# Patient Record
Sex: Male | Born: 1970
Health system: Southern US, Community
[De-identification: ages and names within clinical notes are randomized; demographics above are authoritative.]

## PROBLEM LIST (undated history)

## (undated) DIAGNOSIS — M1990 Unspecified osteoarthritis, unspecified site: Secondary | ICD-10-CM

## (undated) DIAGNOSIS — R52 Pain, unspecified: Secondary | ICD-10-CM

## (undated) DIAGNOSIS — F419 Anxiety disorder, unspecified: Secondary | ICD-10-CM

## (undated) DIAGNOSIS — Z9189 Other specified personal risk factors, not elsewhere classified: Secondary | ICD-10-CM

## (undated) DIAGNOSIS — E119 Type 2 diabetes mellitus without complications: Secondary | ICD-10-CM

## (undated) DIAGNOSIS — Z87442 Personal history of urinary calculi: Secondary | ICD-10-CM

## (undated) DIAGNOSIS — I10 Essential (primary) hypertension: Secondary | ICD-10-CM

## (undated) DIAGNOSIS — E785 Hyperlipidemia, unspecified: Secondary | ICD-10-CM

## (undated) DIAGNOSIS — N201 Calculus of ureter: Secondary | ICD-10-CM

## (undated) DIAGNOSIS — G894 Chronic pain syndrome: Secondary | ICD-10-CM

## (undated) HISTORY — DX: Hyperlipidemia, unspecified: E78.5

## (undated) HISTORY — DX: Essential (primary) hypertension: I10

## (undated) HISTORY — PX: TOE SURGERY: SHX1073

## (undated) HISTORY — DX: Anxiety disorder, unspecified: F41.9

---

## 1998-11-24 ENCOUNTER — Ambulatory Visit (HOSPITAL_COMMUNITY): Admission: RE | Admit: 1998-11-24 | Discharge: 1998-11-24 | Payer: Self-pay | Admitting: Cardiology

## 1999-07-20 ENCOUNTER — Encounter: Payer: Self-pay | Admitting: Family Medicine

## 1999-07-20 ENCOUNTER — Ambulatory Visit (HOSPITAL_COMMUNITY): Admission: RE | Admit: 1999-07-20 | Discharge: 1999-07-20 | Payer: Self-pay | Admitting: Family Medicine

## 2000-05-17 ENCOUNTER — Ambulatory Visit (HOSPITAL_COMMUNITY): Admission: RE | Admit: 2000-05-17 | Discharge: 2000-05-17 | Payer: Self-pay | Admitting: Family Medicine

## 2001-03-30 ENCOUNTER — Encounter: Payer: Self-pay | Admitting: Family Medicine

## 2001-03-30 ENCOUNTER — Ambulatory Visit (HOSPITAL_COMMUNITY): Admission: RE | Admit: 2001-03-30 | Discharge: 2001-03-30 | Payer: Self-pay | Admitting: Family Medicine

## 2002-10-14 ENCOUNTER — Ambulatory Visit (HOSPITAL_COMMUNITY): Admission: RE | Admit: 2002-10-14 | Discharge: 2002-10-14 | Payer: Self-pay | Admitting: *Deleted

## 2002-10-14 ENCOUNTER — Encounter: Payer: Self-pay | Admitting: *Deleted

## 2002-12-25 ENCOUNTER — Encounter: Admission: RE | Admit: 2002-12-25 | Discharge: 2002-12-25 | Payer: Self-pay | Admitting: Family Medicine

## 2002-12-25 ENCOUNTER — Encounter: Payer: Self-pay | Admitting: Family Medicine

## 2003-02-14 ENCOUNTER — Encounter: Admission: RE | Admit: 2003-02-14 | Discharge: 2003-02-14 | Payer: Self-pay | Admitting: Family Medicine

## 2003-02-14 ENCOUNTER — Encounter: Payer: Self-pay | Admitting: Family Medicine

## 2004-06-18 ENCOUNTER — Encounter: Admission: RE | Admit: 2004-06-18 | Discharge: 2004-06-18 | Payer: Self-pay | Admitting: Family Medicine

## 2004-06-19 ENCOUNTER — Emergency Department (HOSPITAL_COMMUNITY): Admission: EM | Admit: 2004-06-19 | Discharge: 2004-06-19 | Payer: Self-pay | Admitting: Emergency Medicine

## 2005-02-28 ENCOUNTER — Encounter: Admission: RE | Admit: 2005-02-28 | Discharge: 2005-05-29 | Payer: Self-pay | Admitting: Family Medicine

## 2005-04-17 ENCOUNTER — Emergency Department (HOSPITAL_COMMUNITY): Admission: EM | Admit: 2005-04-17 | Discharge: 2005-04-18 | Payer: Self-pay | Admitting: Emergency Medicine

## 2007-01-30 ENCOUNTER — Ambulatory Visit (HOSPITAL_COMMUNITY): Admission: RE | Admit: 2007-01-30 | Discharge: 2007-01-30 | Payer: Self-pay | Admitting: Family Medicine

## 2010-05-26 ENCOUNTER — Encounter: Admission: RE | Admit: 2010-05-26 | Discharge: 2010-05-26 | Payer: Self-pay | Admitting: Family Medicine

## 2011-04-14 ENCOUNTER — Other Ambulatory Visit (HOSPITAL_COMMUNITY): Payer: Managed Care, Other (non HMO)

## 2011-04-18 ENCOUNTER — Other Ambulatory Visit: Payer: Self-pay | Admitting: Orthopaedic Surgery

## 2011-04-18 ENCOUNTER — Encounter (HOSPITAL_COMMUNITY): Payer: Managed Care, Other (non HMO) | Attending: Orthopaedic Surgery

## 2011-04-18 DIAGNOSIS — M169 Osteoarthritis of hip, unspecified: Secondary | ICD-10-CM | POA: Insufficient documentation

## 2011-04-18 DIAGNOSIS — Z79899 Other long term (current) drug therapy: Secondary | ICD-10-CM | POA: Insufficient documentation

## 2011-04-18 DIAGNOSIS — Z01812 Encounter for preprocedural laboratory examination: Secondary | ICD-10-CM | POA: Insufficient documentation

## 2011-04-18 DIAGNOSIS — M25559 Pain in unspecified hip: Secondary | ICD-10-CM | POA: Insufficient documentation

## 2011-04-18 DIAGNOSIS — I10 Essential (primary) hypertension: Secondary | ICD-10-CM | POA: Insufficient documentation

## 2011-04-18 DIAGNOSIS — M1610 Unilateral primary osteoarthritis, unspecified hip: Secondary | ICD-10-CM | POA: Insufficient documentation

## 2011-04-18 DIAGNOSIS — Z01810 Encounter for preprocedural cardiovascular examination: Secondary | ICD-10-CM | POA: Insufficient documentation

## 2011-04-18 DIAGNOSIS — E119 Type 2 diabetes mellitus without complications: Secondary | ICD-10-CM | POA: Insufficient documentation

## 2011-04-18 LAB — URINALYSIS, ROUTINE W REFLEX MICROSCOPIC
Bilirubin Urine: NEGATIVE
Glucose, UA: 250 mg/dL — AB
Hgb urine dipstick: NEGATIVE
Ketones, ur: NEGATIVE mg/dL
Nitrite: NEGATIVE
Protein, ur: NEGATIVE mg/dL
Specific Gravity, Urine: 1.026 (ref 1.005–1.030)
Urobilinogen, UA: 0.2 mg/dL (ref 0.0–1.0)
pH: 5.5 (ref 5.0–8.0)

## 2011-04-18 LAB — BASIC METABOLIC PANEL WITH GFR
BUN: 11 mg/dL (ref 6–23)
CO2: 30 meq/L (ref 19–32)
Calcium: 9.6 mg/dL (ref 8.4–10.5)
Chloride: 100 meq/L (ref 96–112)
Creatinine, Ser: 1.1 mg/dL (ref 0.4–1.5)
GFR calc non Af Amer: >60 mL/min
Glucose, Bld: 265 mg/dL — ABNORMAL HIGH (ref 70–99)
Potassium: 4.1 meq/L (ref 3.5–5.1)
Sodium: 141 meq/L (ref 135–145)

## 2011-04-18 LAB — CBC
HCT: 42.3 % (ref 39.0–52.0)
Hemoglobin: 14.3 g/dL (ref 13.0–17.0)
MCH: 28.1 pg (ref 26.0–34.0)
MCHC: 33.8 g/dL (ref 30.0–36.0)
MCV: 83.3 fL (ref 78.0–100.0)
Platelets: 279 K/uL (ref 150–400)
RBC: 5.08 MIL/uL (ref 4.22–5.81)
RDW: 12.2 % (ref 11.5–15.5)
WBC: 8.8 K/uL (ref 4.0–10.5)

## 2011-04-18 LAB — SURGICAL PCR SCREEN
MRSA, PCR: NEGATIVE
Staphylococcus aureus: NEGATIVE

## 2011-04-22 ENCOUNTER — Inpatient Hospital Stay (HOSPITAL_COMMUNITY): Payer: Managed Care, Other (non HMO)

## 2011-04-22 ENCOUNTER — Inpatient Hospital Stay (HOSPITAL_COMMUNITY)
Admission: RE | Admit: 2011-04-22 | Discharge: 2011-04-26 | DRG: 470 | Disposition: A | Discharge status: Home Health | Payer: Managed Care, Other (non HMO) | Source: Ambulatory Visit | Attending: Orthopaedic Surgery | Admitting: Orthopaedic Surgery

## 2011-04-22 DIAGNOSIS — I10 Essential (primary) hypertension: Secondary | ICD-10-CM | POA: Diagnosis present

## 2011-04-22 DIAGNOSIS — R42 Dizziness and giddiness: Secondary | ICD-10-CM | POA: Diagnosis not present

## 2011-04-22 DIAGNOSIS — G8929 Other chronic pain: Secondary | ICD-10-CM | POA: Diagnosis present

## 2011-04-22 DIAGNOSIS — M1611 Unilateral primary osteoarthritis, right hip: Secondary | ICD-10-CM

## 2011-04-22 DIAGNOSIS — Z79899 Other long term (current) drug therapy: Secondary | ICD-10-CM

## 2011-04-22 DIAGNOSIS — M1610 Unilateral primary osteoarthritis, unspecified hip: Principal | ICD-10-CM | POA: Diagnosis present

## 2011-04-22 DIAGNOSIS — E119 Type 2 diabetes mellitus without complications: Secondary | ICD-10-CM | POA: Diagnosis present

## 2011-04-22 DIAGNOSIS — M169 Osteoarthritis of hip, unspecified: Principal | ICD-10-CM | POA: Diagnosis present

## 2011-04-22 HISTORY — PX: TOTAL HIP ARTHROPLASTY: SHX124

## 2011-04-22 LAB — GLUCOSE, CAPILLARY
Glucose-Capillary: 228 mg/dL — ABNORMAL HIGH (ref 70–99)
Glucose-Capillary: 200 mg/dL — ABNORMAL HIGH (ref 70–99)

## 2011-04-22 LAB — TYPE AND SCREEN

## 2011-04-23 LAB — CBC
HCT: 31.5 % — ABNORMAL LOW (ref 39.0–52.0)
MCV: 84 fL (ref 78.0–100.0)
WBC: 6.8 10*3/uL (ref 4.0–10.5)

## 2011-04-23 LAB — GLUCOSE, CAPILLARY
Glucose-Capillary: 208 mg/dL — ABNORMAL HIGH (ref 70–99)
Glucose-Capillary: 201 mg/dL — ABNORMAL HIGH (ref 70–99)
Glucose-Capillary: 223 mg/dL — ABNORMAL HIGH (ref 70–99)

## 2011-04-23 LAB — BASIC METABOLIC PANEL
CO2: 29 mEq/L (ref 19–32)
Calcium: 8.5 mg/dL (ref 8.4–10.5)
GFR calc Af Amer: >60 mL/min (ref >60)
Glucose, Bld: 206 mg/dL — ABNORMAL HIGH (ref 70–99)
Sodium: 137 mEq/L (ref 135–145)

## 2011-04-23 NOTE — H&P (Signed)
  NAMEJAQUIN, COY NO.:  [id number]  MEDICAL RECORD NO.:  [id number]           PATIENT TYPE:  I  LOCATION:  1608                         FACILITY:  [facility] [place]  PHYSICIAN:  Vanita Panda. Magnus Ivan, M.D.DATE OF BIRTH:  [birth year]  DATE OF ADMISSION:  04/22/2011 DATE OF DISCHARGE:                             HISTORY & PHYSICAL   CHIEF COMPLAINT:  Severe right hip pain.  HISTORY OF PRESENT ILLNESS:  Mr. Millirons is a 40 year old gentleman with severe osteoarthritis and degenerative joint disease involving both his hips, the right is worse than left.  He is on chronic Vicodin due to this, and it is greatly affected his life and daily living.  At this point, due to severe pain, he wished to proceed with a total hip arthroplasty.  The risks and benefits have been explained to him in detail and he understands these and does wish to proceed with hip replacement due to the effects on his life.  PAST MEDICAL HISTORY: 1. Arthritis. 2. Diabetes. 3. High blood pressure.  MEDICATIONS:  Metformin, lisinopril, hydrocodone, simvastatin, Lexapro, metoprolol.  FAMILY MEDICAL HISTORY:  Diabetes, leukemia, __________ cancer, high blood pressure.  SOCIAL HISTORY:  No tobacco and no alcohol use.  He does work.  He is divorced.  REVIEW OF SYSTEMS:  Negative for chest pain, shortness of breath, fever, chills, nausea, or vomiting.  PHYSICAL EXAMINATION:  VITAL SIGNS:  Temperature 98.4, heart rate 74, respiratory rate 16, blood pressure 140/90. GENERAL:  He is alert and oriented x3, in no acute distress. HEENT:  Normocephalic, atraumatic.  Pupils equal, round, and reactive to light. NECK:  Supple. LUNGS:  Clear to auscultation bilaterally. HEART:  Regular rate and rhythm. ABDOMEN:  Benign. EXTREMITIES:  Right hip and left hip, both show severe pain with internal and external rotation with a right being worse than the left. His leg lengths were nearly equal.  X-rays  confirmed severe arthritis of both his hips with the right being worse than the left.  IMPRESSION:  This is a 40 year old gentleman with debilitating arthritis of both his hips, right worse than left.  PLAN:  We will proceed today with a right total hip arthroplasty.  He understands the risks and benefits of this, and he is anxious about proceeding with surgery due to his need for chronic pain medications and his __________.     Vanita Panda. Magnus Ivan, M.D.     CYB/MEDQ  D:  04/22/2011  T:  04/23/2011  Job:  [id number]  Electronically Signed by Doneen Poisson M.D. on 04/23/2011 11:15:47 AM

## 2011-04-23 NOTE — Op Note (Signed)
NAMERAIDER, VALBUENA NO.:  [id number]  MEDICAL RECORD NO.:  [id number]           PATIENT TYPE:  I  LOCATION:  1608                         FACILITY:  [facility]  PHYSICIAN:  Vanita Panda. Magnus Ivan, M.D.DATE OF BIRTH:  [birth year]  DATE OF PROCEDURE:  04/22/2011 DATE OF DISCHARGE:                              OPERATIVE REPORT   PREOPERATIVE DIAGNOSES:  Severe end-stage arthritis and degenerative joint disease with chronic pain, right hip.  POSTOPERATIVE DIAGNOSIS:  Severe end-stage arthritis and degenerative joint disease with chronic pain, right hip.  PROCEDURE:  Right total hip arthroplasty through direct anterior approach.  IMPLANTS:  DePuy size 58 Pinnacle Gription cup with screw holes, neutral +4 polyethylene liner, size 15 KLA Corail femoral component with collar and HA coating, size 36 +1.5 ceramic femoral head.  SURGEON:  Vanita Panda. Magnus Ivan, M.D.  ASSISTANT:  Maud Deed, PA-C.  ANTIBIOTICS:  IV Ancef 2 g.  BLOOD LOSS:  1300 cc.  COMPLICATIONS:  None.  INDICATIONS:  Mr. Brandon Gordon is a 40 year old gentleman who is large individual.  He has developed bilateral hip severe degenerative joint disease.  It was recognized either on the MRI in 2007.  He was on chronic Vicodin due to this and now has get to the point where it started to affect his activities of daily living, he wishes to proceed with a total hip arthroplasty.  The risks and benefits of this had been explained to him in detail including the risk of acute blood loss anemia, DVT, and PE; and he just wishes to proceed with surgery.  DESCRIPTION OF PROCEDURE:  After informed consent was obtained. Appropriate right hip was marked.  He was brought to the operating room and general anesthesia was obtained in the stretcher.  Traction boots were placed on his feet prior to placement on the Hana table and a Foley catheter was placed as well.  He was then placed supine on the Hana table and  the perineal post in place with both legs in large-scale retraction with detraction on leg.  His right hip was then prepped and draped with DuraPrep and sterile drapes.  A time-out was called and this identified the correct patient and correct right hip.  I then made an incision 1 cm distal and 3 cm posterior to the anterior superior iliac spine and carried this down obliquely down to the tensor fasciae latae. Tensor fasciae latae was then divided longitudinally and I proceeded with a direct anterior approach to the hip.  A Cobra retractor was placed around the neck laterally and medially.  We tied the capsule after coagulating the lateral femoral circumflex vessels.  I then was able to make my femoral neck cut just proximal to the lesser trochanter. I assessed the neck cut under direct fluoroscopic guidance first and then made a cut under direct visualization.  We then removed the head in its entirety and his head was incredibly large head with significant wear.  There was osteophytes surrounding the acetabulum where we cleanedthese debris as well.  I then began reaming.  We then placed a Hohmann anteromedially and posteriorly, so I began  reaming from the size 45 all the way up to 57.  We then placed the real 58 acetabular component under direct fluoroscopic guidance and had a good seat to this.  Next, attention was turned to the femur.  A hook was placed into the femur for attachment to the Hana table.  His leg was externally rotated to 90 degrees, extended, and adducted.  This allowed access to the femoral canal.  I released the lateral capsule and the piriformis from behind the greater trochanter.  We then used a cookie cutter to open up the femoral canal, then I began broaching with a size 8 broach all the way up to the size 15 broach.  We then placed a trial head and reduced this in a KLA neck and reduced this into the acetabulum.  This was felt to be stable on leg lengths and near  equal under direct fluoroscopic guidance. I then placed the real size 15 stem with a collar in appropriate version in the real ceramic 36 +1.5 head and reduce this back into the acetabulum as well.  I externally rotated the hip to 90 degrees, it was tight, but I could not dislocate him and then internally rotated as well as leg lengths were looked to be equal under direct fluoroscopic guidance, may be just a little bit long, but he has gotten severe disease on his other hip.  We then copiously irrigated the tissues and closed the joint capsule with #1 Ethibond suture followed by interrupted #1 Vicryl into the tensor fasciae latae, 2-0 Vicryl in the subcutaneous tissues, and subcuticular 4-0 Vicryl with Dermabond on the skin and a Mepilex dressing.  He was awakened, extubated, and taken to the recovery room in stable condition.  All final counts were correct and there were no complications noted.  Of note, Maud Deed, PA-C was present for the entirety of the case and her assistance was in interval and exposure and placement of the implants.     Vanita Panda. Magnus Ivan, M.D.     CYB/MEDQ  D:  04/22/2011  T:  04/23/2011  Job:  [id number]  Electronically Signed by Doneen Poisson M.D. on 04/23/2011 11:15:45 AM

## 2011-04-24 LAB — CBC
MCH: 28.1 pg (ref 26.0–34.0)
MCHC: 33.1 g/dL (ref 30.0–36.0)
MCV: 84.7 fL (ref 78.0–100.0)
Platelets: 152 10*3/uL (ref 150–400)
RBC: 3.6 MIL/uL — ABNORMAL LOW (ref 4.22–5.81)

## 2011-04-24 LAB — BASIC METABOLIC PANEL
BUN: 6 mg/dL (ref 6–23)
Chloride: 100 mEq/L (ref 96–112)
Creatinine, Ser: 0.8 mg/dL (ref 0.4–1.5)

## 2011-04-24 LAB — GLUCOSE, CAPILLARY
Glucose-Capillary: 214 mg/dL — ABNORMAL HIGH (ref 70–99)
Glucose-Capillary: 178 mg/dL — ABNORMAL HIGH (ref 70–99)

## 2011-04-25 LAB — BASIC METABOLIC PANEL
CO2: 28 mEq/L (ref 19–32)
Chloride: 97 mEq/L (ref 96–112)
GFR calc Af Amer: >60 mL/min (ref >60)
Sodium: 133 mEq/L — ABNORMAL LOW (ref 135–145)

## 2011-04-25 LAB — GLUCOSE, CAPILLARY
Glucose-Capillary: 189 mg/dL — ABNORMAL HIGH (ref 70–99)
Glucose-Capillary: 166 mg/dL — ABNORMAL HIGH (ref 70–99)

## 2011-04-25 LAB — CBC
Hemoglobin: 9.5 g/dL — ABNORMAL LOW (ref 13.0–17.0)
MCH: 28.1 pg (ref 26.0–34.0)
RBC: 3.38 MIL/uL — ABNORMAL LOW (ref 4.22–5.81)

## 2011-04-26 LAB — GLUCOSE, CAPILLARY: Glucose-Capillary: 177 mg/dL — ABNORMAL HIGH (ref 70–99)

## 2011-04-28 NOTE — Discharge Summary (Signed)
  NAMEJOVANTE, HAMMITT NO.:  [id number]  MEDICAL RECORD NO.:  [id number]           PATIENT TYPE:  I  LOCATION:  1608                         [facility]ACILITY:  [facility]  PHYSICIAN:  Vanita Panda. Magnus Ivan, M.D.DATE OF BIRTH:  06-19-1971  DATE OF ADMISSION:  04/22/2011 DATE OF DISCHARGE:  04/26/2011                              DISCHARGE SUMMARY   ADMITTING DIAGNOSES:  Severe osteoarthritis and degenerative joint disease, right hip.  DISCHARGE DIAGNOSES:  Severe osteoarthritis and degenerative joint disease, right hip.  PROCEDURE:  Right total hip arthroplasty on Apr 22, 2011.  HOSPITAL COURSE:  Mr. Mcmannis was admitted as an inpatient on Apr 22, 2011, after having an elective right total hip arthroplasty through direct anterior approach.  His hospital course was uneventful.  He remained afebrile with stable vital signs throughout hospital course and worked with physical therapy and occupational therapy successfully.  It was felt he can be discharged safely to home.  He was tolerating oral diet.  His incision was clean, dry, and intact.  He was afebrile with stable vital signs by the time of discharge.  DISPOSITION:  To home.  DISCHARGE INSTRUCTIONS:  While he is at home, will continue to work with therapy with weight bearing as tolerated and no hip precautions.  He can get his incisions wet in shower and follow up in my office in 2 weeks.  DISCHARGE MEDICATIONS: 1. Percocet. 2. Robaxin. 3. Xarelto. 4. Metformin. 5. Lisinopril. 6. Simvastatin. 7. Lexapro. 8. Metoprolol.     Vanita Panda. Magnus Ivan, M.D.     CYB/MEDQ  D:  04/26/2011  T:  04/27/2011  Job:  [id number]  Electronically Signed by Doneen Poisson M.D. on 04/28/2011 07:43:27 PM

## 2012-09-24 ENCOUNTER — Ambulatory Visit: Payer: Managed Care, Other (non HMO) | Attending: Family Medicine | Admitting: Physical Therapy

## 2012-09-24 DIAGNOSIS — M25559 Pain in unspecified hip: Secondary | ICD-10-CM | POA: Insufficient documentation

## 2012-09-24 DIAGNOSIS — M545 Low back pain, unspecified: Secondary | ICD-10-CM | POA: Insufficient documentation

## 2012-09-24 DIAGNOSIS — IMO0001 Reserved for inherently not codable concepts without codable children: Secondary | ICD-10-CM | POA: Insufficient documentation

## 2012-09-24 DIAGNOSIS — M2560 Stiffness of unspecified joint, not elsewhere classified: Secondary | ICD-10-CM | POA: Insufficient documentation

## 2012-09-26 ENCOUNTER — Ambulatory Visit: Payer: Managed Care, Other (non HMO) | Admitting: Physical Therapy

## 2012-10-02 ENCOUNTER — Ambulatory Visit: Payer: Managed Care, Other (non HMO) | Admitting: Physical Therapy

## 2012-10-04 ENCOUNTER — Ambulatory Visit: Payer: Managed Care, Other (non HMO) | Admitting: Physical Therapy

## 2012-10-09 ENCOUNTER — Ambulatory Visit: Payer: Managed Care, Other (non HMO) | Admitting: Physical Therapy

## 2012-10-12 ENCOUNTER — Ambulatory Visit: Payer: Managed Care, Other (non HMO) | Admitting: Physical Therapy

## 2012-10-16 ENCOUNTER — Ambulatory Visit: Payer: Managed Care, Other (non HMO) | Admitting: Physical Therapy

## 2012-10-19 ENCOUNTER — Ambulatory Visit: Payer: Managed Care, Other (non HMO) | Attending: Family Medicine | Admitting: Physical Therapy

## 2012-10-19 DIAGNOSIS — M25559 Pain in unspecified hip: Secondary | ICD-10-CM | POA: Insufficient documentation

## 2012-10-19 DIAGNOSIS — M545 Low back pain, unspecified: Secondary | ICD-10-CM | POA: Insufficient documentation

## 2012-10-19 DIAGNOSIS — M2560 Stiffness of unspecified joint, not elsewhere classified: Secondary | ICD-10-CM | POA: Insufficient documentation

## 2012-10-19 DIAGNOSIS — IMO0001 Reserved for inherently not codable concepts without codable children: Secondary | ICD-10-CM | POA: Insufficient documentation

## 2012-10-23 ENCOUNTER — Ambulatory Visit: Payer: Managed Care, Other (non HMO) | Admitting: Physical Therapy

## 2012-10-25 ENCOUNTER — Ambulatory Visit: Payer: Managed Care, Other (non HMO) | Admitting: Physical Therapy

## 2012-10-26 ENCOUNTER — Ambulatory Visit: Payer: Managed Care, Other (non HMO) | Admitting: Physical Therapy

## 2012-10-31 ENCOUNTER — Ambulatory Visit: Payer: Managed Care, Other (non HMO) | Admitting: Physical Therapy

## 2012-11-06 ENCOUNTER — Ambulatory Visit: Payer: Managed Care, Other (non HMO) | Admitting: Physical Therapy

## 2015-03-09 ENCOUNTER — Encounter: Payer: Self-pay | Admitting: Neurology

## 2015-03-10 ENCOUNTER — Encounter: Payer: Self-pay | Admitting: Neurology

## 2015-03-12 ENCOUNTER — Ambulatory Visit: Payer: Managed Care, Other (non HMO) | Admitting: Neurology

## 2015-03-23 ENCOUNTER — Telehealth: Payer: Self-pay | Admitting: Neurology

## 2015-03-23 ENCOUNTER — Encounter: Payer: Self-pay | Admitting: Neurology

## 2015-03-23 NOTE — Telephone Encounter (Signed)
error 

## 2015-03-23 NOTE — Telephone Encounter (Signed)
Pt no showed 03/12/15 appt w/ Dr. Arbutus Leasat. Per Dr. Arbutus Leasat, we will be unable to r/s this appt. No show letter mailed to patient and CC'ed to Dr. Tiburcio PeaHarris / Roanna RaiderSherri S>

## 2015-05-13 ENCOUNTER — Ambulatory Visit: Payer: Managed Care, Other (non HMO) | Admitting: Neurology

## 2015-12-25 MED FILL — METFORMIN HCL ER 500 MG TAB: 500 | 30 days supply | Qty: 120 | Fill #2

## 2015-12-25 MED FILL — BUPROPION HCL XL 300 MG TAB: 300 | 30 days supply | Qty: 30 | Fill #2

## 2016-01-01 MED FILL — ALPRAZolam 1 MG TABS: 1 | 30 days supply | Qty: 60 | Fill #2

## 2016-01-07 DIAGNOSIS — G894 Chronic pain syndrome: Secondary | ICD-10-CM | POA: Diagnosis not present

## 2016-01-07 DIAGNOSIS — M25552 Pain in left hip: Secondary | ICD-10-CM | POA: Diagnosis not present

## 2016-01-07 DIAGNOSIS — Z79891 Long term (current) use of opiate analgesic: Secondary | ICD-10-CM | POA: Diagnosis not present

## 2016-01-07 DIAGNOSIS — G890 Central pain syndrome: Secondary | ICD-10-CM | POA: Diagnosis not present

## 2016-01-07 DIAGNOSIS — M79651 Pain in right thigh: Secondary | ICD-10-CM | POA: Diagnosis not present

## 2016-01-07 DIAGNOSIS — M545 Low back pain: Secondary | ICD-10-CM | POA: Diagnosis not present

## 2016-01-07 DIAGNOSIS — F1120 Opioid dependence, uncomplicated: Secondary | ICD-10-CM | POA: Diagnosis not present

## 2016-01-07 MED FILL — METOPROLOL SUCC ER 100 MG T: 100 | 30 days supply | Qty: 30 | Fill #2

## 2016-01-07 MED FILL — SUBOXONE 8 MG-2 MG SL FILM: 8-2 | 30 days supply | Qty: 90 | Fill #0

## 2016-01-18 MED FILL — VIIBRYD 40 MG TABLET: 40 | 30 days supply | Qty: 30 | Fill #3

## 2016-01-27 DIAGNOSIS — I10 Essential (primary) hypertension: Secondary | ICD-10-CM | POA: Diagnosis not present

## 2016-01-27 DIAGNOSIS — Z7984 Long term (current) use of oral hypoglycemic drugs: Secondary | ICD-10-CM | POA: Diagnosis not present

## 2016-01-27 DIAGNOSIS — F419 Anxiety disorder, unspecified: Secondary | ICD-10-CM | POA: Diagnosis not present

## 2016-01-27 DIAGNOSIS — E1165 Type 2 diabetes mellitus with hyperglycemia: Secondary | ICD-10-CM | POA: Diagnosis not present

## 2016-01-27 DIAGNOSIS — E291 Testicular hypofunction: Secondary | ICD-10-CM | POA: Diagnosis not present

## 2016-01-27 DIAGNOSIS — E785 Hyperlipidemia, unspecified: Secondary | ICD-10-CM | POA: Diagnosis not present

## 2016-01-28 MED FILL — BUPROPION HCL XL 300 MG TAB: 300 | 30 days supply | Qty: 30 | Fill #3

## 2016-02-01 MED FILL — ALPRAZolam 1 MG TABS: 1 | 30 days supply | Qty: 60 | Fill #3

## 2016-02-04 DIAGNOSIS — Z79891 Long term (current) use of opiate analgesic: Secondary | ICD-10-CM | POA: Diagnosis not present

## 2016-02-04 DIAGNOSIS — M25552 Pain in left hip: Secondary | ICD-10-CM | POA: Diagnosis not present

## 2016-02-04 DIAGNOSIS — F1120 Opioid dependence, uncomplicated: Secondary | ICD-10-CM | POA: Diagnosis not present

## 2016-02-04 DIAGNOSIS — M79651 Pain in right thigh: Secondary | ICD-10-CM | POA: Diagnosis not present

## 2016-02-04 DIAGNOSIS — M545 Low back pain: Secondary | ICD-10-CM | POA: Diagnosis not present

## 2016-02-04 DIAGNOSIS — G894 Chronic pain syndrome: Secondary | ICD-10-CM | POA: Diagnosis not present

## 2016-02-04 DIAGNOSIS — G8929 Other chronic pain: Secondary | ICD-10-CM | POA: Diagnosis not present

## 2016-02-04 MED FILL — SUBOXONE 8 MG-2 MG SL FILM: 8-2 | 30 days supply | Qty: 90 | Fill #0

## 2016-02-15 MED FILL — METFORMIN HCL ER 500 MG TAB: 500 | 30 days supply | Qty: 120 | Fill #3

## 2016-02-15 MED FILL — METOPROLOL SUCC ER 100 MG T: 100 | 30 days supply | Qty: 30 | Fill #3

## 2016-02-18 MED FILL — ATORVASTATIN 40 MG TABLET: 40 | 30 days supply | Qty: 30 | Fill #1

## 2016-02-18 MED FILL — TANZEUM 30 MG PEN INJECT: 30 | 28 days supply | Qty: 4 | Fill #2

## 2016-02-18 MED FILL — VIIBRYD 40 MG TABLET: 40 | 30 days supply | Qty: 30 | Fill #4

## 2016-02-26 MED FILL — BUPROPION HCL XL 300 MG TAB: 300 | 30 days supply | Qty: 30 | Fill #4

## 2016-03-02 MED FILL — ALPRAZolam 1 MG TABS: 1 | 30 days supply | Qty: 60 | Fill #0

## 2016-03-03 DIAGNOSIS — E291 Testicular hypofunction: Secondary | ICD-10-CM | POA: Diagnosis not present

## 2016-03-08 DIAGNOSIS — Z79891 Long term (current) use of opiate analgesic: Secondary | ICD-10-CM | POA: Diagnosis not present

## 2016-03-08 DIAGNOSIS — G8929 Other chronic pain: Secondary | ICD-10-CM | POA: Diagnosis not present

## 2016-03-08 DIAGNOSIS — G894 Chronic pain syndrome: Secondary | ICD-10-CM | POA: Diagnosis not present

## 2016-03-08 DIAGNOSIS — M545 Low back pain: Secondary | ICD-10-CM | POA: Diagnosis not present

## 2016-03-08 DIAGNOSIS — M79651 Pain in right thigh: Secondary | ICD-10-CM | POA: Diagnosis not present

## 2016-03-08 DIAGNOSIS — M25552 Pain in left hip: Secondary | ICD-10-CM | POA: Diagnosis not present

## 2016-03-08 DIAGNOSIS — F1120 Opioid dependence, uncomplicated: Secondary | ICD-10-CM | POA: Diagnosis not present

## 2016-03-08 MED FILL — SUBOXONE 8 MG-2 MG SL FILM: 8-2 | 30 days supply | Qty: 90 | Fill #0

## 2016-03-18 MED FILL — METOPROLOL SUCC ER 100 MG T: 100 | 30 days supply | Qty: 30 | Fill #4

## 2016-03-18 MED FILL — VIIBRYD 40 MG TABLET: 40 | 30 days supply | Qty: 30 | Fill #0

## 2016-03-23 DIAGNOSIS — N23 Unspecified renal colic: Secondary | ICD-10-CM | POA: Diagnosis not present

## 2016-03-24 DIAGNOSIS — N132 Hydronephrosis with renal and ureteral calculous obstruction: Secondary | ICD-10-CM | POA: Diagnosis not present

## 2016-03-24 DIAGNOSIS — N201 Calculus of ureter: Secondary | ICD-10-CM | POA: Diagnosis not present

## 2016-03-24 DIAGNOSIS — R109 Unspecified abdominal pain: Secondary | ICD-10-CM | POA: Diagnosis not present

## 2016-03-24 DIAGNOSIS — K429 Umbilical hernia without obstruction or gangrene: Secondary | ICD-10-CM | POA: Diagnosis not present

## 2016-03-24 DIAGNOSIS — N200 Calculus of kidney: Secondary | ICD-10-CM | POA: Diagnosis not present

## 2016-03-24 DIAGNOSIS — I10 Essential (primary) hypertension: Secondary | ICD-10-CM | POA: Diagnosis not present

## 2016-03-24 DIAGNOSIS — R3129 Other microscopic hematuria: Secondary | ICD-10-CM | POA: Diagnosis not present

## 2016-03-24 DIAGNOSIS — K4020 Bilateral inguinal hernia, without obstruction or gangrene, not specified as recurrent: Secondary | ICD-10-CM | POA: Diagnosis not present

## 2016-03-24 DIAGNOSIS — Z87442 Personal history of urinary calculi: Secondary | ICD-10-CM | POA: Diagnosis not present

## 2016-03-24 DIAGNOSIS — R110 Nausea: Secondary | ICD-10-CM | POA: Diagnosis not present

## 2016-03-25 MED FILL — ONDANSETRON ODT 4 MG TABLET: 4 | 3 days supply | Qty: 10 | Fill #0

## 2016-03-25 MED FILL — TAMSULOSIN HCL 0.4 MG CAP: 0.4 | 30 days supply | Qty: 30 | Fill #0

## 2016-03-26 DIAGNOSIS — N23 Unspecified renal colic: Secondary | ICD-10-CM | POA: Diagnosis not present

## 2016-03-26 DIAGNOSIS — R1084 Generalized abdominal pain: Secondary | ICD-10-CM | POA: Diagnosis not present

## 2016-03-28 MED FILL — METFORMIN HCL ER 500 MG TAB: 500 | 30 days supply | Qty: 120 | Fill #4

## 2016-03-28 MED FILL — ATORVASTATIN 40 MG TABLET: 40 | 30 days supply | Qty: 30 | Fill #0

## 2016-03-28 MED FILL — BUPROPION HCL XL 300 MG TAB: 300 | 30 days supply | Qty: 30 | Fill #5

## 2016-03-30 ENCOUNTER — Emergency Department (HOSPITAL_COMMUNITY)
Admission: EM | Admit: 2016-03-30 | Discharge: 2016-03-30 | Disposition: A | Discharge status: Home / Self Care | Payer: 59 | Attending: Emergency Medicine | Admitting: Emergency Medicine

## 2016-03-30 ENCOUNTER — Encounter (HOSPITAL_COMMUNITY): Payer: Self-pay | Admitting: Oncology

## 2016-03-30 ENCOUNTER — Emergency Department (HOSPITAL_COMMUNITY): Payer: 59

## 2016-03-30 DIAGNOSIS — R109 Unspecified abdominal pain: Secondary | ICD-10-CM | POA: Diagnosis not present

## 2016-03-30 DIAGNOSIS — E119 Type 2 diabetes mellitus without complications: Secondary | ICD-10-CM | POA: Insufficient documentation

## 2016-03-30 DIAGNOSIS — F419 Anxiety disorder, unspecified: Secondary | ICD-10-CM | POA: Diagnosis not present

## 2016-03-30 DIAGNOSIS — N201 Calculus of ureter: Secondary | ICD-10-CM | POA: Diagnosis not present

## 2016-03-30 DIAGNOSIS — I10 Essential (primary) hypertension: Secondary | ICD-10-CM | POA: Diagnosis not present

## 2016-03-30 DIAGNOSIS — Z7984 Long term (current) use of oral hypoglycemic drugs: Secondary | ICD-10-CM | POA: Diagnosis not present

## 2016-03-30 DIAGNOSIS — E785 Hyperlipidemia, unspecified: Secondary | ICD-10-CM | POA: Diagnosis not present

## 2016-03-30 DIAGNOSIS — Z79899 Other long term (current) drug therapy: Secondary | ICD-10-CM | POA: Insufficient documentation

## 2016-03-30 DIAGNOSIS — M1990 Unspecified osteoarthritis, unspecified site: Secondary | ICD-10-CM | POA: Diagnosis not present

## 2016-03-30 LAB — BASIC METABOLIC PANEL
Anion gap: 10 (ref 5–15)
BUN: 16 mg/dL (ref 6–20)
CHLORIDE: 99 mmol/L — AB (ref 101–111)
CO2: 29 mmol/L (ref 22–32)
CREATININE: 1.07 mg/dL (ref 0.61–1.24)
Calcium: 8.9 mg/dL (ref 8.9–10.3)
GFR calc non Af Amer: >60 mL/min (ref >60)
GLUCOSE: 332 mg/dL — AB (ref 65–99)
Potassium: 4.4 mmol/L (ref 3.5–5.1)
Sodium: 138 mmol/L (ref 135–145)

## 2016-03-30 LAB — CBC WITH DIFFERENTIAL/PLATELET
BASOS PCT: 0 %
Basophils Absolute: 0 10*3/uL (ref 0.0–0.1)
Eosinophils Absolute: 0.1 10*3/uL (ref 0.0–0.7)
Eosinophils Relative: 2 %
HEMATOCRIT: 39 % (ref 39.0–52.0)
HEMOGLOBIN: 13.4 g/dL (ref 13.0–17.0)
LYMPHS ABS: 2.1 10*3/uL (ref 0.7–4.0)
LYMPHS PCT: 31 %
MCH: 27.8 pg (ref 26.0–34.0)
MCHC: 34.4 g/dL (ref 30.0–36.0)
MCV: 80.9 fL (ref 78.0–100.0)
MONOS PCT: 7 %
Monocytes Absolute: 0.4 10*3/uL (ref 0.1–1.0)
NEUTROS ABS: 4.1 10*3/uL (ref 1.7–7.7)
NEUTROS PCT: 60 %
Platelets: 214 10*3/uL (ref 150–400)
RBC: 4.82 MIL/uL (ref 4.22–5.81)
RDW: 12.4 % (ref 11.5–15.5)
WBC: 6.8 10*3/uL (ref 4.0–10.5)

## 2016-03-30 LAB — URINALYSIS, ROUTINE W REFLEX MICROSCOPIC
Bilirubin Urine: NEGATIVE
Glucose, UA: >1000 mg/dL — AB
Ketones, ur: NEGATIVE mg/dL
Leukocytes, UA: NEGATIVE
NITRITE: NEGATIVE
PH: 6 (ref 5.0–8.0)
Protein, ur: NEGATIVE mg/dL
SPECIFIC GRAVITY, URINE: 1.041 — AB (ref 1.005–1.030)

## 2016-03-30 LAB — URINE MICROSCOPIC-ADD ON

## 2016-03-30 MED ORDER — KETOROLAC TROMETHAMINE 10 MG PO TABS: 10.0000 mg | ORAL_TABLET | Freq: Four times a day (QID) | ORAL | Status: DC | PRN

## 2016-03-30 MED ORDER — KETOROLAC TROMETHAMINE 30 MG/ML IJ SOLN
30.0000 mg | Freq: Once | INTRAMUSCULAR | Status: AC
  Administered 2016-03-30: 30 mg via INTRAVENOUS
  Filled 2016-03-30: qty 1

## 2016-03-30 MED FILL — KETOROLAC 10 MG TABLET: 10 | 5 days supply | Qty: 20 | Fill #0

## 2016-03-30 NOTE — ED Notes (Signed)
Pt has known hx of kidney stones.  Presents w/ c/o left flank pain.  Pain rated 6/10, stabbing in nature.

## 2016-03-30 NOTE — ED Notes (Signed)
Pt transported to XRay 

## 2016-03-30 NOTE — Discharge Instructions (Signed)
Kidney Stones °Kidney stones (urolithiasis) are deposits that form inside your kidneys. The intense pain is caused by the stone moving through the urinary tract. When the stone moves, the ureter goes into spasm around the stone. The stone is usually passed in the urine.  °CAUSES  °· A disorder that makes certain neck glands produce too much parathyroid hormone (primary hyperparathyroidism). °· A buildup of uric acid crystals, similar to gout in your joints. °· Narrowing (stricture) of the ureter. °· A kidney obstruction present at birth (congenital obstruction). °· Previous surgery on the kidney or ureters. °· Numerous kidney infections. °SYMPTOMS  °· Feeling sick to your stomach (nauseous). °· Throwing up (vomiting). °· Blood in the urine (hematuria). °· Pain that usually spreads (radiates) to the groin. °· Frequency or urgency of urination. °DIAGNOSIS  °· Taking a history and physical exam. °· Blood or urine tests. °· CT scan. °· Occasionally, an examination of the inside of the urinary bladder (cystoscopy) is performed. °TREATMENT  °· Observation. °· Increasing your fluid intake. °· Extracorporeal shock wave lithotripsy--This is a noninvasive procedure that uses shock waves to break up kidney stones. °· Surgery may be needed if you have severe pain or persistent obstruction. There are various surgical procedures. Most of the procedures are performed with the use of small instruments. Only small incisions are needed to accommodate these instruments, so recovery time is minimized. °The size, location, and chemical composition are all important variables that will determine the proper choice of action for you. Talk to your health care provider to better understand your situation so that you will minimize the risk of injury to yourself and your kidney.  °HOME CARE INSTRUCTIONS  °· Drink enough water and fluids to keep your urine clear or pale yellow. This will help you to pass the stone or stone fragments. °· Strain  all urine through the provided strainer. Keep all particulate matter and stones for your health care provider to see. The stone causing the pain may be as small as a grain of salt. It is very important to use the strainer each and every time you pass your urine. The collection of your stone will allow your health care provider to analyze it and verify that a stone has actually passed. The stone analysis will often identify what you can do to reduce the incidence of recurrences. °· Only take over-the-counter or prescription medicines for pain, discomfort, or fever as directed by your health care provider. °· Keep all follow-up visits as told by your health care provider. This is important. °· Get follow-up X-rays if required. The absence of pain does not always mean that the stone has passed. It may have only stopped moving. If the urine remains completely obstructed, it can cause loss of kidney function or even complete destruction of the kidney. It is your responsibility to make sure X-rays and follow-ups are completed. Ultrasounds of the kidney can show blockages and the status of the kidney. Ultrasounds are not associated with any radiation and can be performed easily in a matter of minutes. °· Make changes to your daily diet as told by your health care provider. You may be told to: °¨ Limit the amount of salt that you eat. °¨ Eat 5 or more servings of fruits and vegetables each day. °¨ Limit the amount of meat, poultry, fish, and eggs that you eat. °· Collect a 24-hour urine sample as told by your health care provider. You may need to collect another urine sample every 6-12   months. °SEEK MEDICAL CARE IF: °· You experience pain that is progressive and unresponsive to any pain medicine you have been prescribed. °SEEK IMMEDIATE MEDICAL CARE IF:  °· Pain cannot be controlled with the prescribed medicine. °· You have a fever or shaking chills. °· The severity or intensity of pain increases over 18 hours and is not  relieved by pain medicine. °· You develop a new onset of abdominal pain. °· You feel faint or pass out. °· You are unable to urinate. °  °This information is not intended to replace advice given to you by your health care provider. Make sure you discuss any questions you have with your health care provider. °  °Document Released: 12/05/2005 Document Revised: 08/26/2015 Document Reviewed: 05/08/2013 °Elsevier Interactive Patient Education ©2016 [employer] ° °

## 2016-03-30 NOTE — ED Provider Notes (Signed)
CSN: [id number]     Arrival date & time 03/30/16  0014 History   First MD Initiated Contact with Patient 03/30/16 [phone]     Chief Complaint  Patient presents with  . Flank Pain     (Consider location/radiation/quality/duration/timing/severity/associated sxs/prior Treatment) HPI Comments: Patient presents to the emergency part for evaluation of left flank pain. Patient reports that he has been having ongoing issues with left flank pain for 10 days. He does have a history of kidney stones and was seen at urgent care, found to have blood in his urine and was told he likely had a urinary calculus. Pain improved, then the pain came back. 4 days ago he was seen at [facility] and had a CT that confirmed a 3 mm proximal ureteral stone on the left. He was treated with Toradol with improvement, but last night pain came back and was severe, constant.  Patient is a 45 y.o. male presenting with flank pain.  Flank Pain    Past Medical History  Diagnosis Date  . Hyperlipidemia   . Diabetes (HCC)   . Hypertension   . Arthritis   . Anxiety   . Renal disorder     kidney stones   Past Surgical History  Procedure Laterality Date  . Joint replacement      Right Hip   No family history on file. Social History  Substance Use Topics  . Smoking status: Never Smoker   . Smokeless tobacco: Never Used  . Alcohol Use: 0.0 oz/week    0 Standard drinks or equivalent per week    Review of Systems  Genitourinary: Positive for flank pain.  All other systems reviewed and are negative.     Allergies  Lexapro and Victoza  Home Medications   Prior to Admission medications   Medication Sig Start Date End Date Taking? Authorizing Provider  Albiglutide 30 MG PEN Inject 30 mg into the skin once a week.    Yes Historical Provider, MD  ALPRAZolam Prudy Feeler) 1 MG tablet Take 1 mg by mouth at bedtime as needed.   Yes Historical Provider, MD  atorvastatin (LIPITOR) 40 MG tablet Take 40 mg by mouth daily.   Yes  Historical Provider, MD  buPROPion (WELLBUTRIN XL) 300 MG 24 hr tablet Take 300 mg by mouth daily.   Yes Historical Provider, MD  metFORMIN (GLUMETZA) 1000 MG (MOD) 24 hr tablet Take 1,000 mg by mouth daily.   Yes Historical Provider, MD  metoprolol succinate (TOPROL-XL) 100 MG 24 hr tablet Take 100 mg by mouth daily. 03/18/16  Yes Historical Provider, MD  ondansetron (ZOFRAN-ODT) 4 MG disintegrating tablet Take 4 mg by mouth every 8 (eight) hours as needed for nausea or vomiting.   Yes Historical Provider, MD  SUBOXONE 8-2 MG FILM Place 1 Film under the tongue 3 (three) times daily as needed (pain).  03/08/16  Yes Historical Provider, MD  tamsulosin (FLOMAX) 0.4 MG CAPS capsule Take 0.4 mg by mouth daily. 03/24/16 03/24/17 Yes Historical Provider, MD  Vilazodone HCl (VIIBRYD) 40 MG TABS Take 40 mg by mouth daily.    Yes Historical Provider, MD  ketorolac (TORADOL) 10 MG tablet Take 1 tablet (10 mg total) by mouth every 6 (six) hours as needed. 03/30/16   Gilda Crease, MD   BP 130/87 mmHg  Pulse 62  Temp(Src) 97.9 F (36.6 C) (Oral)  Resp 16  Ht  (1.981 m)  Wt 270 lb (122.471 kg)  BMI 31.21 kg/m2  SpO2 94% Physical Exam  Constitutional: He is oriented to person, place, and time. He appears well-developed and well-nourished. No distress.  HENT:  Head: Normocephalic and atraumatic.  Right Ear: Hearing normal.  Left Ear: Hearing normal.  Nose: Nose normal.  Mouth/Throat: Oropharynx is clear and moist and mucous membranes are normal.  Eyes: Conjunctivae and EOM are normal. Pupils are equal, round, and reactive to light.  Neck: Normal range of motion. Neck supple.  Cardiovascular: Regular rhythm, S1 normal and S2 normal.  Exam reveals no gallop and no friction rub.   No murmur heard. Pulmonary/Chest: Effort normal and breath sounds normal. No respiratory distress. He exhibits no tenderness.  Abdominal: Soft. Normal appearance and bowel sounds are normal. There is no  hepatosplenomegaly. There is no tenderness. There is no rebound, no guarding, no tenderness at McBurney's point and negative Murphy's sign. No hernia.  Musculoskeletal: Normal range of motion.  Neurological: He is alert and oriented to person, place, and time. He has normal strength. No cranial nerve deficit or sensory deficit. Coordination normal. GCS eye subscore is 4. GCS verbal subscore is 5. GCS motor subscore is 6.  Skin: Skin is warm, dry and intact. No rash noted. No cyanosis.  Psychiatric: He has a normal mood and affect. His speech is normal and behavior is normal. Thought content normal.  Nursing note and vitals reviewed.   ED Course  Procedures (including critical care time) Labs Review Labs Reviewed  URINALYSIS, ROUTINE W REFLEX MICROSCOPIC (NOT AT [facility]) - Abnormal; Notable for the following:    Specific Gravity, Urine 1.041 (*)    Glucose, UA >1000 (*)    Hgb urine dipstick TRACE (*)    All other components within normal limits  URINE MICROSCOPIC-ADD ON - Abnormal; Notable for the following:    Squamous Epithelial / LPF 0-5 (*)    Bacteria, UA RARE (*)    All other components within normal limits  BASIC METABOLIC PANEL - Abnormal; Notable for the following:    Chloride 99 (*)    Glucose, Bld 332 (*)    All other components within normal limits  CBC WITH DIFFERENTIAL/PLATELET    Imaging Review Dg Abd 1 View  03/30/2016  CLINICAL DATA:  LEFT flank pain worsening for few days. History of kidney stones, hypertension, diabetes, hyperlipidemia. EXAM: ABDOMEN - 1 VIEW COMPARISON:  None. FINDINGS: The bowel gas pattern is normal. Small calcification projects along the course of the mid LEFT ureter. Status post RIGHT hip total arthroplasty. Moderate degenerative change of LEFT hip incompletely characterized. IMPRESSION: Small calcification projecting along the course of the LEFT mid ureter. Nonspecific bowel gas pattern. Electronically Signed   By: Awilda Metroourtnay  Bloomer M.D.   On:  03/30/2016 05:23   I have personally reviewed and evaluated these images and lab results as part of my medical decision-making.   EKG Interpretation None      MDM   Final diagnoses:  Ureterolithiasis    Presents with persistent pain. Patient has been previously diagnosed with ureterolithiasis. Patient is a 3 x 3 proximal ureteral stone diagnosed at [facility] several days ago. KUB shows mid ureter. Patient improved after Toradol. Discussed briefly with Dr. Berneice HeinrichManny, recommends oral Toradol and will follow up in office.    Gilda Creasehristopher J Kendall Justo, MD 03/31/16 [phone]

## 2016-03-31 DIAGNOSIS — Z0000 Encounter for general adult medical examination without abnormal findings: Secondary | ICD-10-CM | POA: Diagnosis not present

## 2016-03-31 DIAGNOSIS — N201 Calculus of ureter: Secondary | ICD-10-CM | POA: Diagnosis not present

## 2016-04-04 ENCOUNTER — Other Ambulatory Visit: Payer: Self-pay | Admitting: Urology

## 2016-04-04 ENCOUNTER — Encounter (HOSPITAL_BASED_OUTPATIENT_CLINIC_OR_DEPARTMENT_OTHER): Payer: Self-pay | Admitting: *Deleted

## 2016-04-04 MED FILL — ALPRAZolam 1 MG TABS: 1 | 30 days supply | Qty: 60 | Fill #1

## 2016-04-04 MED FILL — ONDANSETRON ODT 4 MG TABLET: 4 | 7 days supply | Qty: 20 | Fill #0

## 2016-04-05 ENCOUNTER — Encounter (HOSPITAL_BASED_OUTPATIENT_CLINIC_OR_DEPARTMENT_OTHER): Payer: Self-pay | Admitting: *Deleted

## 2016-04-05 DIAGNOSIS — G890 Central pain syndrome: Secondary | ICD-10-CM | POA: Diagnosis not present

## 2016-04-05 DIAGNOSIS — G894 Chronic pain syndrome: Secondary | ICD-10-CM | POA: Diagnosis not present

## 2016-04-05 DIAGNOSIS — G8929 Other chronic pain: Secondary | ICD-10-CM | POA: Diagnosis not present

## 2016-04-05 DIAGNOSIS — F1120 Opioid dependence, uncomplicated: Secondary | ICD-10-CM | POA: Diagnosis not present

## 2016-04-05 NOTE — Progress Notes (Signed)
NPO AFTER MN.  ARRIVE AT 0630. NEEDS EKG.  CURRENT LAB RESULTS IN CHART AND EPIC.  WILL TAKE AM MEDS W/ SIPS OF WATER EXCEPTION NO METFORMIN  AND IF NEEDED TAKE RX PAIN MED.

## 2016-04-05 NOTE — Progress Notes (Signed)
   04/05/16 1526  OBSTRUCTIVE SLEEP APNEA  Have you ever been diagnosed with sleep apnea through a sleep study? No  Do you snore loudly (loud enough to be heard through closed doors)?  1  Do you often feel tired, fatigued, or sleepy during the daytime (such as falling asleep during driving or talking to someone)? 0  Has anyone observed you stop breathing during your sleep? 1  Do you have, or are you being treated for high blood pressure? 1  BMI more than 35 kg/m2? 0  Age > 50 (1-yes) 0  Neck circumference greater than:Male 16 inches or larger, Male 17inches or larger? 1  Male Gender (Yes=1) 1  Obstructive Sleep Apnea Score 5  Score 5 or greater  Results sent to PCP

## 2016-04-06 ENCOUNTER — Other Ambulatory Visit: Payer: Self-pay | Admitting: Urology

## 2016-04-06 NOTE — H&P (Signed)
Chief Complaint left flank pain.   Active Problems Problems  1. Calculus of left ureter (N20.1)  History of Present Illness Brandon Gordon is a 45 yo WM with a history of stones who had the onset about 1.5 weeks ago of left flank pain. The pain recurred more severely a week ago tuesday. He saw his PCP and has hematuria. He got toradol which helped. He was in his [place] office and had pain on the way home and went to the [facility] ER and had toradol with relief and got a CT done that showed a 3 x 43m stone in the left proximal ureter. Saturday am he had another bout of pain. He got toradol again with relief. [facility] had another episode [facility]  He had a KUB that showed a possible 388mleft proximal stone. Today he has some nausea and vague abdominal pain with some penile discomfort. He has had no gross hematuria. He has some urgency.   Past Medical History Problems  1. History of Anxiety (F41.9) 2. History of Borderline diabetes (R73.03) 3. History of arthritis (Z87.39) 4. History of depression (Z86.59) 5. History of hypercholesterolemia (Z86.39) 6. History of hypertension (Z86.79) 7. History of renal calculi (Z(V61.607 Surgical History Problems  1. History of Total Hip Replacement  Current Meds 1. ALPRAZolam 1 MG Oral Tablet;  Therapy: (Recorded:13Apr2017) to Recorded 2. Atorvastatin Calcium 40 MG Oral Tablet;  Therapy: (Recorded:13Apr2017) to Recorded 3. BuPROPion HCl ER (XL) 300 MG Oral Tablet Extended Release 24 Hour;  Therapy: (Recorded:13Apr2017) to Recorded 4. Flomax 0.4 MG CPCR;  Therapy: (Recorded:13Apr2017) to Recorded 5. MetFORMIN HCl - 1000 MG Oral Tablet;  Therapy: (Recorded:13Apr2017) to Recorded 6. Metoprolol Tartrate 25 MG Oral Tablet;  Therapy: (Recorded:13Apr2017) to Recorded 7. Suboxone 8-2 MG SUBL;  Therapy: (Recorded:13Apr2017) to Recorded 8. Toradol Oral TABS;  Therapy: (Recorded:13Apr2017) to Recorded 9. Zofran TABS;  Therapy:  (Recorded:13Apr2017) to Recorded  He is on the suboxone for pain control and is using that for the stone pain.   Allergies Medication  1. No Known Drug Allergies  Family History Problems  1. Family history of leukemia (Z80.6) : Brother 2. Family history of malignant neoplasm of male breast (Z80.3) : Mother  Social History Problems    Denied: History of Alcohol use   Caffeine use (F15.90)   3 a day   Married   Never a smoker   Number of children   1 son   Occupation   DiCamera operatorReview of Systems Genitourinary, constitutional, skin, eye, otolaryngeal, hematologic/lymphatic, cardiovascular, pulmonary, endocrine, musculoskeletal, gastrointestinal, neurological and psychiatric system(s) were reviewed and pertinent findings if present are noted and are otherwise negative.  Genitourinary: urinary frequency, nocturia and penile pain.  Gastrointestinal: nausea and constipation.  Constitutional: feeling tired (fatigue).  Musculoskeletal: back pain and joint pain.  Neurological: headache.  Psychiatric: anxiety and depression.    Vitals Vital Signs [Data Includes: Last 1 Day]  Recorded: 13Apr2017 02:26PM  Height: 6 ft 6 in Weight: 270 lb  BMI Calculated: 31.2 BSA Calculated: 2.56 Blood Pressure: 125 / 76 Temperature: 98.2 F Heart Rate: 80  Physical Exam Constitutional: Well nourished and well developed . No acute distress.  ENT:. The ears and nose are normal in appearance.  Neck: The appearance of the neck is normal and no neck mass is present.  Pulmonary: No respiratory distress and normal respiratory rhythm and effort.  Cardiovascular: Heart rate and rhythm are normal . No peripheral edema.  Abdomen: No masses are palpated. The abdomen is no rebound and no guarding. Mild tenderness in the LLQ is present. No right CVA tenderness and mild left CVA tenderness. No hernias are palpable. No hepatosplenomegaly noted.  Lymphatics: The posterior cervical and  supraclavicular nodes are not enlarged or tender.  Skin: Normal skin turgor, no visible rash and no visible skin lesions.  Neuro/Psych:. Mood and affect are appropriate.    Results/Data Urine [Data Includes: Last 1 Day]   13Apr2017  COLOR YELLOW   APPEARANCE CLEAR   SPECIFIC GRAVITY 1.020   pH 5.5   GLUCOSE 3+   BILIRUBIN NEGATIVE   KETONE NEGATIVE   BLOOD TRACE   PROTEIN NEGATIVE   NITRITE NEGATIVE   LEUKOCYTE ESTERASE NEGATIVE   SQUAMOUS EPITHELIAL/HPF NONE SEEN HPF  WBC NONE SEEN WBC/HPF  RBC 0-2 RBC/HPF  BACTERIA NONE SEEN HPF  CRYSTALS NONE SEEN HPF  CASTS NONE SEEN LPF  Yeast NONE SEEN HPF   Old records or history reviewed: [facility] from [facility] and [facility] reviewed.  The following images/tracing/specimen were independently visualized:  CT and KUB's reviewed.  The following clinical lab reports were reviewed:  UA reviewed.  KUB today shows a faint shadow on the left between L4 and L5 that is similar to the findings from 4/12 and could be his stone when compared with the location on CT.  There is a right hip prosthesis and no other significant bone, gas or soft tissue findings.     Assessment Assessed  1. Calculus of left ureter (N20.1)  Left proximal stone with intermittent pain.   Plan  Calculus of left ureter  1. Start: Sprix 15.75 MG/SPRAY Nasal Solution; 1 squirt each nostril every 6-8hrs as  needed for pain 2. Follow-up Schedule Surgery Office  Follow-up  Status: Complete  Done: 13Apr2017 3. KUB; Status:Resulted - Requires Verification;   Done: 13Apr2017 02:55PM Health Maintenance  4. UA With REFLEX; [Do Not Release]; Status:Resulted - Requires Verification;   Done:  [id number] 02:17PM  I reviewed the options of continues MET vs Ureteroscopy. Stone not well enough seen for ESWL.  He would like to try to pass the stone so I will send Sprix since he gets relief with toradol and I will set him up for possible ureteroscopy next week if he is still symptomatic.  I reviewed  the risks of the procedure in detail and told him to go to the [facility] ER if he has recurrent symptoms over the weekend and he should probably have ureteroscopy if that occurs.

## 2016-04-07 ENCOUNTER — Encounter (HOSPITAL_BASED_OUTPATIENT_CLINIC_OR_DEPARTMENT_OTHER)
Admission: RE | Disposition: A | Discharge status: Home / Self Care | Payer: Self-pay | Source: Ambulatory Visit | Attending: Urology

## 2016-04-07 ENCOUNTER — Ambulatory Visit (HOSPITAL_BASED_OUTPATIENT_CLINIC_OR_DEPARTMENT_OTHER): Payer: 59 | Admitting: Anesthesiology

## 2016-04-07 ENCOUNTER — Encounter (HOSPITAL_BASED_OUTPATIENT_CLINIC_OR_DEPARTMENT_OTHER): Payer: Self-pay | Admitting: Anesthesiology

## 2016-04-07 ENCOUNTER — Ambulatory Visit (HOSPITAL_COMMUNITY): Payer: 59

## 2016-04-07 ENCOUNTER — Ambulatory Visit (HOSPITAL_BASED_OUTPATIENT_CLINIC_OR_DEPARTMENT_OTHER)
Admission: RE | Admit: 2016-04-07 | Discharge: 2016-04-07 | Disposition: A | Discharge status: Home / Self Care | Payer: 59 | Source: Ambulatory Visit | Attending: Urology | Admitting: Urology

## 2016-04-07 DIAGNOSIS — K5900 Constipation, unspecified: Secondary | ICD-10-CM | POA: Diagnosis not present

## 2016-04-07 DIAGNOSIS — F329 Major depressive disorder, single episode, unspecified: Secondary | ICD-10-CM | POA: Insufficient documentation

## 2016-04-07 DIAGNOSIS — Z538 Procedure and treatment not carried out for other reasons: Secondary | ICD-10-CM | POA: Diagnosis not present

## 2016-04-07 DIAGNOSIS — R109 Unspecified abdominal pain: Secondary | ICD-10-CM | POA: Diagnosis present

## 2016-04-07 DIAGNOSIS — I10 Essential (primary) hypertension: Secondary | ICD-10-CM | POA: Diagnosis not present

## 2016-04-07 DIAGNOSIS — Z79899 Other long term (current) drug therapy: Secondary | ICD-10-CM | POA: Insufficient documentation

## 2016-04-07 DIAGNOSIS — E7800 Pure hypercholesterolemia, unspecified: Secondary | ICD-10-CM | POA: Diagnosis not present

## 2016-04-07 DIAGNOSIS — Z96649 Presence of unspecified artificial hip joint: Secondary | ICD-10-CM | POA: Diagnosis not present

## 2016-04-07 DIAGNOSIS — R7303 Prediabetes: Secondary | ICD-10-CM | POA: Insufficient documentation

## 2016-04-07 DIAGNOSIS — N201 Calculus of ureter: Secondary | ICD-10-CM | POA: Insufficient documentation

## 2016-04-07 DIAGNOSIS — Z79891 Long term (current) use of opiate analgesic: Secondary | ICD-10-CM | POA: Diagnosis not present

## 2016-04-07 DIAGNOSIS — M1990 Unspecified osteoarthritis, unspecified site: Secondary | ICD-10-CM | POA: Diagnosis not present

## 2016-04-07 DIAGNOSIS — F419 Anxiety disorder, unspecified: Secondary | ICD-10-CM | POA: Insufficient documentation

## 2016-04-07 DIAGNOSIS — Z87442 Personal history of urinary calculi: Secondary | ICD-10-CM | POA: Diagnosis not present

## 2016-04-07 DIAGNOSIS — Z7984 Long term (current) use of oral hypoglycemic drugs: Secondary | ICD-10-CM | POA: Diagnosis not present

## 2016-04-07 HISTORY — DX: Personal history of urinary calculi: Z87.442

## 2016-04-07 HISTORY — DX: Type 2 diabetes mellitus without complications: E11.9

## 2016-04-07 HISTORY — DX: Unspecified osteoarthritis, unspecified site: M19.90

## 2016-04-07 HISTORY — DX: Other specified personal risk factors, not elsewhere classified: Z91.89

## 2016-04-07 HISTORY — DX: Calculus of ureter: N20.1

## 2016-04-07 LAB — GLUCOSE, CAPILLARY: Glucose-Capillary: 207 mg/dL — ABNORMAL HIGH (ref 65–99)

## 2016-04-07 SURGERY — CYSTOURETEROSCOPY, WITH RETROGRADE PYELOGRAM AND STENT INSERTION
Anesthesia: General | Laterality: Left

## 2016-04-07 MED ORDER — DEXAMETHASONE SODIUM PHOSPHATE 10 MG/ML IJ SOLN
INTRAMUSCULAR | Status: AC
  Filled 2016-04-07: qty 1

## 2016-04-07 MED ORDER — CEFAZOLIN SODIUM-DEXTROSE 2-4 GM/100ML-% IV SOLN
2.0000 g | INTRAVENOUS | Status: DC
  Filled 2016-04-07: qty 100

## 2016-04-07 MED ORDER — MIDAZOLAM HCL 2 MG/2ML IJ SOLN
INTRAMUSCULAR | Status: AC
  Filled 2016-04-07: qty 2

## 2016-04-07 MED ORDER — LACTATED RINGERS IV SOLN
INTRAVENOUS | Status: DC
  Filled 2016-04-07: qty 1000

## 2016-04-07 MED ORDER — PROPOFOL 10 MG/ML IV BOLUS
INTRAVENOUS | Status: AC
  Filled 2016-04-07: qty 40

## 2016-04-07 MED ORDER — CEFAZOLIN SODIUM-DEXTROSE 2-4 GM/100ML-% IV SOLN
INTRAVENOUS | Status: AC
  Filled 2016-04-07: qty 100

## 2016-04-07 MED ORDER — FENTANYL CITRATE (PF) 100 MCG/2ML IJ SOLN
INTRAMUSCULAR | Status: AC
  Filled 2016-04-07: qty 2

## 2016-04-07 MED ORDER — FENTANYL CITRATE (PF) 100 MCG/2ML IJ SOLN
INTRAMUSCULAR | Status: AC
  Filled 2016-04-07: qty 4

## 2016-04-07 MED ORDER — KETOROLAC TROMETHAMINE 30 MG/ML IJ SOLN
INTRAMUSCULAR | Status: AC
  Filled 2016-04-07: qty 1

## 2016-04-07 MED ORDER — ONDANSETRON HCL 4 MG/2ML IJ SOLN
INTRAMUSCULAR | Status: AC
  Filled 2016-04-07: qty 2

## 2016-04-07 MED FILL — SUBOXONE 8 MG-2 MG SL FILM: 8-2 | 30 days supply | Qty: 90 | Fill #0

## 2016-04-07 SURGICAL SUPPLY — 36 items
BAG DRAIN URO-CYSTO SKYTR STRL (DRAIN) IMPLANT
BASKET LASER NITINOL 1.9FR (BASKET) IMPLANT
BASKET STNLS GEMINI 4WIRE 3FR (BASKET) IMPLANT
BASKET ZERO TIP NITINOL 2.4FR (BASKET) IMPLANT
BSKT STON RTRVL 120 1.9FR (BASKET)
BSKT STON RTRVL GEM 120X11 3FR (BASKET)
BSKT STON RTRVL ZERO TP 2.4FR (BASKET)
CANISTER SUCT LVC 12 LTR MEDI- (MISCELLANEOUS) IMPLANT
CATH URET 5FR 28IN CONE TIP (BALLOONS)
CATH URET 5FR 28IN OPEN ENDED (CATHETERS) IMPLANT
CATH URET 5FR 70CM CONE TIP (BALLOONS) IMPLANT
CLOTH BEACON ORANGE TIMEOUT ST (SAFETY) IMPLANT
ELECT REM PT RETURN 9FT ADLT (ELECTROSURGICAL)
ELECTRODE REM PT RTRN 9FT ADLT (ELECTROSURGICAL) IMPLANT
FIBER LASER FLEXIVA 1000 (UROLOGICAL SUPPLIES) IMPLANT
FIBER LASER FLEXIVA 365 (UROLOGICAL SUPPLIES) IMPLANT
FIBER LASER FLEXIVA 550 (UROLOGICAL SUPPLIES) IMPLANT
FIBER LASER TRAC TIP (UROLOGICAL SUPPLIES) IMPLANT
GLOVE SURG SS PI 8.0 STRL IVOR (GLOVE) IMPLANT
GOWN STRL REUS W/ TWL LRG LVL3 (GOWN DISPOSABLE) IMPLANT
GOWN STRL REUS W/ TWL XL LVL3 (GOWN DISPOSABLE) IMPLANT
GOWN STRL REUS W/TWL LRG LVL3 (GOWN DISPOSABLE)
GOWN STRL REUS W/TWL XL LVL3 (GOWN DISPOSABLE)
GUIDEWIRE 0.038 PTFE COATED (WIRE) IMPLANT
GUIDEWIRE ANG ZIPWIRE 038X150 (WIRE) IMPLANT
GUIDEWIRE STR DUAL SENSOR (WIRE) IMPLANT
IV NS IRRIG 3000ML ARTHROMATIC (IV SOLUTION) IMPLANT
KIT BALLIN UROMAX 15FX10 (LABEL) IMPLANT
KIT BALLN UROMAX 15FX4 (MISCELLANEOUS) IMPLANT
KIT BALLN UROMAX 26 75X4 (MISCELLANEOUS)
KIT ROOM TURNOVER WOR (KITS) IMPLANT
MANIFOLD NEPTUNE II (INSTRUMENTS) IMPLANT
PACK CYSTO (CUSTOM PROCEDURE TRAY) IMPLANT
SET HIGH PRES BAL DIL (LABEL)
SHEATH ACCESS URETERAL 38CM (SHEATH) IMPLANT
TUBE CONNECTING 12X1/4 (SUCTIONS) IMPLANT

## 2016-04-07 NOTE — Interval H&P Note (Signed)
History and Physical Interval Note:  04/07/2016 7:20 AM  Brandon Gordon  has presented today for surgery, with the diagnosis of LEFT PROXIMAL STONE  The various methods of treatment have been discussed with the patient and family. After consideration of risks, benefits and other options for treatment, the patient has consented to  Procedure(s): CYSTOSCOPY WITH RETROGRADE PYELOGRAM, URETEROSCOPY AND POSSIBLE STENT PLACEMENT (Left) STONE EXTRACTION WITH BASKET (Left) as a surgical intervention .  The patient's history has been reviewed, patient examined, no change in status, stable for surgery.  I have reviewed the patient's chart and labs.  Questions were answered to the patient's satisfaction.     Kreig Parson,Hodge J

## 2016-04-07 NOTE — Anesthesia Preprocedure Evaluation (Addendum)
Anesthesia Evaluation  Patient identified by MRN, date of birth, ID band Patient awake    Reviewed: Allergy & Precautions, NPO status , Patient's Chart, lab work & pertinent test results, reviewed documented beta blocker date and time   Airway Mallampati: III  TM Distance: >3 FB Neck ROM: Full    Dental no notable dental hx. (+) Chipped,    Pulmonary  At risk for OSA STOP-BANG score-5   Pulmonary exam normal breath sounds clear to auscultation       Cardiovascular hypertension, Pt. on medications and Pt. on home beta blockers Normal cardiovascular exam Rhythm:Regular Rate:Normal     Neuro/Psych Anxiety negative neurological ROS     GI/Hepatic negative GI ROS, Neg liver ROS, (+)     substance abuse  , On suboxone for hx/o narcotic use with chronic hip pain   Endo/Other  diabetes, Well Controlled, Type 2, Oral Hypoglycemic AgentsObesity Hyperlipidemia  Renal/GU Left proximal ureteral calculus  negative genitourinary   Musculoskeletal  (+) Arthritis , Osteoarthritis,    Abdominal (+) + obese,   Peds  Hematology negative hematology ROS (+)   Anesthesia Other Findings   Reproductive/Obstetrics                          Anesthesia Physical Anesthesia Plan  ASA: II  Anesthesia Plan: General   Post-op Pain Management:    Induction: Intravenous  Airway Management Planned: LMA  Additional Equipment:   Intra-op Plan:   Post-operative Plan: Extubation in OR  Informed Consent: I have reviewed the patients History and Physical, chart, labs and discussed the procedure including the risks, benefits and alternatives for the proposed anesthesia with the patient or authorized representative who has indicated his/her understanding and acceptance.   Dental advisory given  Plan Discussed with: CRNA, Anesthesiologist and Surgeon  Anesthesia Plan Comments:         Anesthesia Quick  Evaluation

## 2016-04-07 NOTE — Progress Notes (Signed)
Pt's procedure canceled, no stone noted on KUB. IV d/c'd and pt ambulated out of surgical center without incident.

## 2016-04-07 NOTE — Interval H&P Note (Signed)
History and Physical Interval Note:  He has been pain free for 2 days and did have burning with urination on a couple of occasions.   He didn't see the stone pass but there is no stone apparent on the KUB today.  I discussed this with Jonny RuizJohn and we are going to cancel surgery.   04/07/2016 8:17 AM  Pincus SanesJohn S Matchett  has presented today for surgery, with the diagnosis of LEFT PROXIMAL STONE  The various methods of treatment have been discussed with the patient and family. After consideration of risks, benefits and other options for treatment, the patient has consented to  Procedure(s): CYSTOSCOPY WITH RETROGRADE PYELOGRAM, URETEROSCOPY AND POSSIBLE STENT PLACEMENT (Left) STONE EXTRACTION WITH BASKET (Left) as a surgical intervention .  The patient's history has been reviewed, patient examined, no change in status, stable for surgery.  I have reviewed the patient's chart and labs.  Questions were answered to the patient's satisfaction.     Jeferson Boozer,Lamin J

## 2016-04-13 DIAGNOSIS — Z7984 Long term (current) use of oral hypoglycemic drugs: Secondary | ICD-10-CM | POA: Diagnosis not present

## 2016-04-13 DIAGNOSIS — E1165 Type 2 diabetes mellitus with hyperglycemia: Secondary | ICD-10-CM | POA: Diagnosis not present

## 2016-04-13 DIAGNOSIS — E291 Testicular hypofunction: Secondary | ICD-10-CM | POA: Diagnosis not present

## 2016-04-13 DIAGNOSIS — F329 Major depressive disorder, single episode, unspecified: Secondary | ICD-10-CM | POA: Diagnosis not present

## 2016-04-13 DIAGNOSIS — E782 Mixed hyperlipidemia: Secondary | ICD-10-CM | POA: Diagnosis not present

## 2016-04-13 DIAGNOSIS — F419 Anxiety disorder, unspecified: Secondary | ICD-10-CM | POA: Diagnosis not present

## 2016-04-13 DIAGNOSIS — I10 Essential (primary) hypertension: Secondary | ICD-10-CM | POA: Diagnosis not present

## 2016-04-13 MED FILL — TANZEUM 30 MG PEN INJECT: 30 | 28 days supply | Qty: 4 | Fill #0

## 2016-04-19 MED FILL — VIIBRYD 40 MG TABLET: 40 | 30 days supply | Qty: 30 | Fill #1

## 2016-04-19 MED FILL — METOPROLOL SUCC ER 100 MG T: 100 | 30 days supply | Qty: 30 | Fill #5

## 2016-04-28 MED FILL — METFORMIN HCL ER 500 MG TAB: 500 | 30 days supply | Qty: 120 | Fill #5

## 2016-04-28 MED FILL — BUPROPION HCL XL 300 MG TAB: 300 | 30 days supply | Qty: 30 | Fill #6

## 2016-04-28 MED FILL — ATORVASTATIN 40 MG TABLET: 40 | 30 days supply | Qty: 30 | Fill #1

## 2016-05-02 DIAGNOSIS — M545 Low back pain: Secondary | ICD-10-CM | POA: Diagnosis not present

## 2016-05-02 DIAGNOSIS — M25552 Pain in left hip: Secondary | ICD-10-CM | POA: Diagnosis not present

## 2016-05-02 DIAGNOSIS — M79651 Pain in right thigh: Secondary | ICD-10-CM | POA: Diagnosis not present

## 2016-05-02 DIAGNOSIS — G894 Chronic pain syndrome: Secondary | ICD-10-CM | POA: Diagnosis not present

## 2016-05-02 DIAGNOSIS — F1120 Opioid dependence, uncomplicated: Secondary | ICD-10-CM | POA: Diagnosis not present

## 2016-05-02 DIAGNOSIS — Z79891 Long term (current) use of opiate analgesic: Secondary | ICD-10-CM | POA: Diagnosis not present

## 2016-05-04 MED FILL — ALPRAZolam 1 MG TABS: 1 | 30 days supply | Qty: 60 | Fill #2

## 2016-05-05 MED FILL — SUBOXONE 8 MG-2 MG SL FILM: 8-2 | 30 days supply | Qty: 90 | Fill #0

## 2016-05-17 DIAGNOSIS — E291 Testicular hypofunction: Secondary | ICD-10-CM | POA: Diagnosis not present

## 2016-05-19 MED FILL — VIIBRYD 40 MG TABLET: 40 | 30 days supply | Qty: 30 | Fill #2

## 2016-05-19 MED FILL — METOPROLOL SUCC ER 100 MG T: 100 | 30 days supply | Qty: 30 | Fill #0

## 2016-05-30 MED FILL — ATORVASTATIN 40 MG TABLET: 40 | 90 days supply | Qty: 90 | Fill #0

## 2016-05-30 MED FILL — BUPROPION HCL XL 300 MG TAB: 300 | 30 days supply | Qty: 30 | Fill #0

## 2016-06-01 DIAGNOSIS — G8929 Other chronic pain: Secondary | ICD-10-CM | POA: Diagnosis not present

## 2016-06-01 DIAGNOSIS — G890 Central pain syndrome: Secondary | ICD-10-CM | POA: Diagnosis not present

## 2016-06-01 DIAGNOSIS — F1120 Opioid dependence, uncomplicated: Secondary | ICD-10-CM | POA: Diagnosis not present

## 2016-06-01 DIAGNOSIS — Z79891 Long term (current) use of opiate analgesic: Secondary | ICD-10-CM | POA: Diagnosis not present

## 2016-06-01 DIAGNOSIS — G894 Chronic pain syndrome: Secondary | ICD-10-CM | POA: Diagnosis not present

## 2016-06-02 MED FILL — ALPRAZolam 1 MG TABS: 1 | 30 days supply | Qty: 60 | Fill #0

## 2016-06-02 MED FILL — SUBOXONE 8 MG-2 MG SL FILM: 8-2 | 30 days supply | Qty: 90 | Fill #0

## 2016-06-13 DIAGNOSIS — E291 Testicular hypofunction: Secondary | ICD-10-CM | POA: Diagnosis not present

## 2016-06-17 MED FILL — METFORMIN HCL ER 500 MG TAB: 500 | 30 days supply | Qty: 120 | Fill #6

## 2016-06-17 MED FILL — METOPROLOL SUCC ER 100 MG T: 100 | 30 days supply | Qty: 30 | Fill #1

## 2016-06-17 MED FILL — VIIBRYD 40 MG TABLET: 40 | 30 days supply | Qty: 30 | Fill #3

## 2016-06-23 DIAGNOSIS — F419 Anxiety disorder, unspecified: Secondary | ICD-10-CM | POA: Diagnosis not present

## 2016-06-23 DIAGNOSIS — F329 Major depressive disorder, single episode, unspecified: Secondary | ICD-10-CM | POA: Diagnosis not present

## 2016-06-23 DIAGNOSIS — M159 Polyosteoarthritis, unspecified: Secondary | ICD-10-CM | POA: Diagnosis not present

## 2016-06-23 DIAGNOSIS — R5383 Other fatigue: Secondary | ICD-10-CM | POA: Diagnosis not present

## 2016-06-23 DIAGNOSIS — E782 Mixed hyperlipidemia: Secondary | ICD-10-CM | POA: Diagnosis not present

## 2016-06-23 DIAGNOSIS — E1165 Type 2 diabetes mellitus with hyperglycemia: Secondary | ICD-10-CM | POA: Diagnosis not present

## 2016-06-23 DIAGNOSIS — I10 Essential (primary) hypertension: Secondary | ICD-10-CM | POA: Diagnosis not present

## 2016-06-23 DIAGNOSIS — E291 Testicular hypofunction: Secondary | ICD-10-CM | POA: Diagnosis not present

## 2016-06-24 MED FILL — TANZEUM 30 MG PEN INJECT: 30 | 28 days supply | Qty: 4 | Fill #1

## 2016-06-28 MED FILL — BUPROPION HCL XL 300 MG TAB: 300 | 30 days supply | Qty: 30 | Fill #1

## 2016-06-29 DIAGNOSIS — M545 Low back pain: Secondary | ICD-10-CM | POA: Diagnosis not present

## 2016-06-29 DIAGNOSIS — F1120 Opioid dependence, uncomplicated: Secondary | ICD-10-CM | POA: Diagnosis not present

## 2016-06-29 DIAGNOSIS — G894 Chronic pain syndrome: Secondary | ICD-10-CM | POA: Diagnosis not present

## 2016-06-29 DIAGNOSIS — M25552 Pain in left hip: Secondary | ICD-10-CM | POA: Diagnosis not present

## 2016-06-29 DIAGNOSIS — M79652 Pain in left thigh: Secondary | ICD-10-CM | POA: Diagnosis not present

## 2016-06-29 DIAGNOSIS — Z79891 Long term (current) use of opiate analgesic: Secondary | ICD-10-CM | POA: Diagnosis not present

## 2016-07-01 MED FILL — ALPRAZolam 1 MG TABS: 1 | 30 days supply | Qty: 60 | Fill #1

## 2016-07-01 MED FILL — SUBOXONE 8 MG-2 MG SL FILM: 8-2 | 30 days supply | Qty: 90 | Fill #0

## 2016-07-14 DIAGNOSIS — E291 Testicular hypofunction: Secondary | ICD-10-CM | POA: Diagnosis not present

## 2016-07-15 DIAGNOSIS — L03031 Cellulitis of right toe: Secondary | ICD-10-CM | POA: Diagnosis not present

## 2016-07-15 DIAGNOSIS — S90862A Insect bite (nonvenomous), left foot, initial encounter: Secondary | ICD-10-CM | POA: Diagnosis not present

## 2016-07-15 MED FILL — CEPHALEXIN 500 MG CAPSULE: 500 | 7 days supply | Qty: 21 | Fill #0

## 2016-07-19 MED FILL — METOPROLOL SUCC ER 100 MG T: 100 | 30 days supply | Qty: 30 | Fill #2

## 2016-07-19 MED FILL — VIIBRYD 40 MG TABLET: 40 | 30 days supply | Qty: 30 | Fill #4

## 2016-07-29 MED FILL — TANZEUM 30 MG PEN INJECT: 30 | 28 days supply | Qty: 4 | Fill #2

## 2016-07-29 MED FILL — BUPROPION HCL XL 300 MG TAB: 300 | 30 days supply | Qty: 30 | Fill #2

## 2016-07-29 MED FILL — METFORMIN HCL ER 500 MG TAB: 500 | 30 days supply | Qty: 120 | Fill #0

## 2016-07-29 MED FILL — ALPRAZolam 1 MG TABS: 1 | 30 days supply | Qty: 60 | Fill #2

## 2016-08-01 DIAGNOSIS — F1120 Opioid dependence, uncomplicated: Secondary | ICD-10-CM | POA: Diagnosis not present

## 2016-08-01 DIAGNOSIS — M25552 Pain in left hip: Secondary | ICD-10-CM | POA: Diagnosis not present

## 2016-08-01 DIAGNOSIS — M79652 Pain in left thigh: Secondary | ICD-10-CM | POA: Diagnosis not present

## 2016-08-01 DIAGNOSIS — G894 Chronic pain syndrome: Secondary | ICD-10-CM | POA: Diagnosis not present

## 2016-08-01 DIAGNOSIS — M545 Low back pain: Secondary | ICD-10-CM | POA: Diagnosis not present

## 2016-08-01 DIAGNOSIS — Z79891 Long term (current) use of opiate analgesic: Secondary | ICD-10-CM | POA: Diagnosis not present

## 2016-08-02 MED FILL — SUBOXONE 8 MG-2 MG SL FILM: 8-2 | 30 days supply | Qty: 90 | Fill #0

## 2016-08-15 MED FILL — VIIBRYD 40 MG TABLET: 40 | 30 days supply | Qty: 30 | Fill #5

## 2016-08-16 DIAGNOSIS — E291 Testicular hypofunction: Secondary | ICD-10-CM | POA: Diagnosis not present

## 2016-08-26 MED FILL — BUPROPION HCL XL 300 MG TAB: 300 | 30 days supply | Qty: 30 | Fill #0

## 2016-08-26 MED FILL — ALPRAZolam 1 MG TABS: 1 | 30 days supply | Qty: 60 | Fill #0

## 2016-08-26 MED FILL — METOPROLOL SUCC ER 100 MG T: 100 | 30 days supply | Qty: 30 | Fill #3

## 2016-08-31 DIAGNOSIS — G894 Chronic pain syndrome: Secondary | ICD-10-CM | POA: Diagnosis not present

## 2016-08-31 DIAGNOSIS — M79652 Pain in left thigh: Secondary | ICD-10-CM | POA: Diagnosis not present

## 2016-08-31 DIAGNOSIS — F1120 Opioid dependence, uncomplicated: Secondary | ICD-10-CM | POA: Diagnosis not present

## 2016-08-31 DIAGNOSIS — M25552 Pain in left hip: Secondary | ICD-10-CM | POA: Diagnosis not present

## 2016-08-31 DIAGNOSIS — Z79891 Long term (current) use of opiate analgesic: Secondary | ICD-10-CM | POA: Diagnosis not present

## 2016-08-31 DIAGNOSIS — G8929 Other chronic pain: Secondary | ICD-10-CM | POA: Diagnosis not present

## 2016-08-31 DIAGNOSIS — M545 Low back pain: Secondary | ICD-10-CM | POA: Diagnosis not present

## 2016-08-31 DIAGNOSIS — G890 Central pain syndrome: Secondary | ICD-10-CM | POA: Diagnosis not present

## 2016-09-01 MED FILL — SUBOXONE 8 MG-2 MG SL FILM: 8-2 | 30 days supply | Qty: 90 | Fill #0

## 2016-09-02 MED FILL — TANZEUM 30 MG PEN INJECT: 30 | 28 days supply | Qty: 4 | Fill #3

## 2016-09-02 MED FILL — METFORMIN HCL ER 500 MG TAB: 500 | 30 days supply | Qty: 120 | Fill #1

## 2016-09-09 DIAGNOSIS — L03032 Cellulitis of left toe: Secondary | ICD-10-CM | POA: Diagnosis not present

## 2016-09-14 MED FILL — VIIBRYD 40 MG TABLET: 40 | 30 days supply | Qty: 30 | Fill #0

## 2016-09-14 MED FILL — ATORVASTATIN 40 MG TABLET: 40 | 30 days supply | Qty: 30 | Fill #0

## 2016-09-26 MED FILL — ALPRAZolam 1 MG TABS: 1 | 30 days supply | Qty: 60 | Fill #0

## 2016-09-27 DIAGNOSIS — E291 Testicular hypofunction: Secondary | ICD-10-CM | POA: Diagnosis not present

## 2016-09-27 MED FILL — METFORMIN HCL ER 500 MG TAB: 500 | 30 days supply | Qty: 120 | Fill #0

## 2016-09-27 MED FILL — BUPROPION HCL XL 300 MG TAB: 300 | 30 days supply | Qty: 30 | Fill #1

## 2016-09-28 MED FILL — METOPROLOL SUCC ER 100 MG T: 100 | 30 days supply | Qty: 30 | Fill #0

## 2016-09-29 DIAGNOSIS — G8929 Other chronic pain: Secondary | ICD-10-CM | POA: Diagnosis not present

## 2016-09-29 DIAGNOSIS — G890 Central pain syndrome: Secondary | ICD-10-CM | POA: Diagnosis not present

## 2016-09-29 DIAGNOSIS — F1120 Opioid dependence, uncomplicated: Secondary | ICD-10-CM | POA: Diagnosis not present

## 2016-09-29 DIAGNOSIS — Z79891 Long term (current) use of opiate analgesic: Secondary | ICD-10-CM | POA: Diagnosis not present

## 2016-09-29 DIAGNOSIS — M79652 Pain in left thigh: Secondary | ICD-10-CM | POA: Diagnosis not present

## 2016-09-29 DIAGNOSIS — G894 Chronic pain syndrome: Secondary | ICD-10-CM | POA: Diagnosis not present

## 2016-09-29 MED FILL — SUBOXONE 8 MG-2 MG SL FILM: 8-2 | 30 days supply | Qty: 90 | Fill #0

## 2016-10-10 DIAGNOSIS — I10 Essential (primary) hypertension: Secondary | ICD-10-CM | POA: Diagnosis not present

## 2016-10-10 DIAGNOSIS — E291 Testicular hypofunction: Secondary | ICD-10-CM | POA: Diagnosis not present

## 2016-10-10 DIAGNOSIS — F324 Major depressive disorder, single episode, in partial remission: Secondary | ICD-10-CM | POA: Diagnosis not present

## 2016-10-10 DIAGNOSIS — E785 Hyperlipidemia, unspecified: Secondary | ICD-10-CM | POA: Diagnosis not present

## 2016-10-10 DIAGNOSIS — Z23 Encounter for immunization: Secondary | ICD-10-CM | POA: Diagnosis not present

## 2016-10-10 DIAGNOSIS — E1165 Type 2 diabetes mellitus with hyperglycemia: Secondary | ICD-10-CM | POA: Diagnosis not present

## 2016-10-10 DIAGNOSIS — L600 Ingrowing nail: Secondary | ICD-10-CM | POA: Diagnosis not present

## 2016-10-10 DIAGNOSIS — Z794 Long term (current) use of insulin: Secondary | ICD-10-CM | POA: Diagnosis not present

## 2016-10-10 DIAGNOSIS — E119 Type 2 diabetes mellitus without complications: Secondary | ICD-10-CM | POA: Diagnosis not present

## 2016-10-10 MED FILL — AMOX-CLAV 875-125 MG TABLET: 875-125 | 10 days supply | Qty: 20 | Fill #0

## 2016-10-11 MED FILL — ONE TOUCH VERIO TEST STRIP: 50 days supply | Qty: 50 | Fill #0

## 2016-10-11 MED FILL — UNIFINE PENTIPS 32GX5/32: 32G X 4 MM | 90 days supply | Qty: 100 | Fill #0

## 2016-10-11 MED FILL — UNIFINE PENTIPS 32GX5/32": 32G X 4 MM | 90 days supply | Qty: 100 | Fill #0

## 2016-10-11 MED FILL — ONE TOUCH DELICA 33G LANCET: 90 days supply | Qty: 100 | Fill #0

## 2016-10-17 MED FILL — VIIBRYD 40 MG TABLET: 40 | 30 days supply | Qty: 30 | Fill #0

## 2016-10-26 MED FILL — ATORVASTATIN 40 MG TABLET: 40 | 90 days supply | Qty: 90 | Fill #0

## 2016-10-27 DIAGNOSIS — G894 Chronic pain syndrome: Secondary | ICD-10-CM | POA: Diagnosis not present

## 2016-10-27 DIAGNOSIS — G603 Idiopathic progressive neuropathy: Secondary | ICD-10-CM | POA: Diagnosis not present

## 2016-10-27 DIAGNOSIS — F1120 Opioid dependence, uncomplicated: Secondary | ICD-10-CM | POA: Diagnosis not present

## 2016-10-27 DIAGNOSIS — Z79891 Long term (current) use of opiate analgesic: Secondary | ICD-10-CM | POA: Diagnosis not present

## 2016-10-27 DIAGNOSIS — G541 Lumbosacral plexus disorders: Secondary | ICD-10-CM | POA: Diagnosis not present

## 2016-10-27 DIAGNOSIS — M79652 Pain in left thigh: Secondary | ICD-10-CM | POA: Diagnosis not present

## 2016-10-27 MED FILL — METOPROLOL SUCC ER 100 MG T: 100 | 30 days supply | Qty: 30 | Fill #0

## 2016-10-27 MED FILL — METFORMIN HCL ER 500 MG TAB: 500 | 30 days supply | Qty: 120 | Fill #2

## 2016-10-27 MED FILL — ALPRAZolam 1 MG TABS: 1 | 30 days supply | Qty: 60 | Fill #0

## 2016-10-27 MED FILL — BUPROPION HCL XL 300 MG TAB: 300 | 30 days supply | Qty: 30 | Fill #0

## 2016-10-28 MED FILL — SUBOXONE 8 MG-2 MG SL FILM: 8-2 | 30 days supply | Qty: 90 | Fill #0

## 2016-11-14 MED FILL — VIIBRYD 40 MG TABLET: 40 | 30 days supply | Qty: 30 | Fill #1

## 2016-11-28 DIAGNOSIS — Z79891 Long term (current) use of opiate analgesic: Secondary | ICD-10-CM | POA: Diagnosis not present

## 2016-11-28 DIAGNOSIS — M79651 Pain in right thigh: Secondary | ICD-10-CM | POA: Diagnosis not present

## 2016-11-28 DIAGNOSIS — F1120 Opioid dependence, uncomplicated: Secondary | ICD-10-CM | POA: Diagnosis not present

## 2016-11-28 DIAGNOSIS — G894 Chronic pain syndrome: Secondary | ICD-10-CM | POA: Diagnosis not present

## 2016-11-28 DIAGNOSIS — M79652 Pain in left thigh: Secondary | ICD-10-CM | POA: Diagnosis not present

## 2016-11-28 DIAGNOSIS — M545 Low back pain: Secondary | ICD-10-CM | POA: Diagnosis not present

## 2016-11-28 DIAGNOSIS — M25552 Pain in left hip: Secondary | ICD-10-CM | POA: Diagnosis not present

## 2016-11-28 MED FILL — BUPROPION HCL XL 300 MG TAB: 300 | 30 days supply | Qty: 30 | Fill #1

## 2016-11-28 MED FILL — METOPROLOL SUCC ER 100 MG T: 100 | 30 days supply | Qty: 30 | Fill #1

## 2016-11-28 MED FILL — SUBOXONE 8 MG-2 MG SL FILM: 8-2 | 30 days supply | Qty: 90 | Fill #0

## 2016-11-28 MED FILL — ALPRAZolam 1 MG TABS: 1 | 30 days supply | Qty: 60 | Fill #1

## 2016-12-14 MED FILL — VIIBRYD 40 MG TABLET: 40 | 30 days supply | Qty: 30 | Fill #2

## 2016-12-14 MED FILL — METFORMIN HCL ER 500 MG TAB: 500 | 30 days supply | Qty: 120 | Fill #3

## 2016-12-26 MED FILL — METOPROLOL SUCC ER 100 MG T: 100 | 30 days supply | Qty: 30 | Fill #2

## 2016-12-26 MED FILL — ALPRAZolam 1 MG TABS: 1 | 30 days supply | Qty: 60 | Fill #2

## 2016-12-28 DIAGNOSIS — G894 Chronic pain syndrome: Secondary | ICD-10-CM | POA: Diagnosis not present

## 2016-12-28 DIAGNOSIS — F1120 Opioid dependence, uncomplicated: Secondary | ICD-10-CM | POA: Diagnosis not present

## 2016-12-28 DIAGNOSIS — Z79891 Long term (current) use of opiate analgesic: Secondary | ICD-10-CM | POA: Diagnosis not present

## 2016-12-28 DIAGNOSIS — M25552 Pain in left hip: Secondary | ICD-10-CM | POA: Diagnosis not present

## 2016-12-28 DIAGNOSIS — M79651 Pain in right thigh: Secondary | ICD-10-CM | POA: Diagnosis not present

## 2016-12-28 DIAGNOSIS — M546 Pain in thoracic spine: Secondary | ICD-10-CM | POA: Diagnosis not present

## 2016-12-28 DIAGNOSIS — M545 Low back pain: Secondary | ICD-10-CM | POA: Diagnosis not present

## 2016-12-29 MED FILL — SUBOXONE 8 MG-2 MG SL FILM: 8-2 | 30 days supply | Qty: 90 | Fill #0

## 2016-12-29 MED FILL — BUPROPION HCL XL 300 MG TAB: 300 | 30 days supply | Qty: 30 | Fill #2

## 2017-01-10 DIAGNOSIS — E291 Testicular hypofunction: Secondary | ICD-10-CM | POA: Diagnosis not present

## 2017-01-13 MED FILL — VIIBRYD 40 MG TABLET: 40 | 30 days supply | Qty: 30 | Fill #3

## 2017-01-13 MED FILL — METFORMIN HCL ER 500 MG TAB: 500 | 30 days supply | Qty: 120 | Fill #0

## 2017-01-18 DIAGNOSIS — E1165 Type 2 diabetes mellitus with hyperglycemia: Secondary | ICD-10-CM | POA: Diagnosis not present

## 2017-01-18 DIAGNOSIS — E785 Hyperlipidemia, unspecified: Secondary | ICD-10-CM | POA: Diagnosis not present

## 2017-01-18 DIAGNOSIS — Z794 Long term (current) use of insulin: Secondary | ICD-10-CM | POA: Diagnosis not present

## 2017-01-18 DIAGNOSIS — F419 Anxiety disorder, unspecified: Secondary | ICD-10-CM | POA: Diagnosis not present

## 2017-01-18 DIAGNOSIS — Z7984 Long term (current) use of oral hypoglycemic drugs: Secondary | ICD-10-CM | POA: Diagnosis not present

## 2017-01-18 DIAGNOSIS — E291 Testicular hypofunction: Secondary | ICD-10-CM | POA: Diagnosis not present

## 2017-01-18 DIAGNOSIS — M549 Dorsalgia, unspecified: Secondary | ICD-10-CM | POA: Diagnosis not present

## 2017-01-18 DIAGNOSIS — I10 Essential (primary) hypertension: Secondary | ICD-10-CM | POA: Diagnosis not present

## 2017-01-25 MED FILL — ALPRAZolam 1 MG TABS: 1 | 30 days supply | Qty: 60 | Fill #0

## 2017-01-30 DIAGNOSIS — G894 Chronic pain syndrome: Secondary | ICD-10-CM | POA: Diagnosis not present

## 2017-01-30 DIAGNOSIS — M545 Low back pain: Secondary | ICD-10-CM | POA: Diagnosis not present

## 2017-01-30 DIAGNOSIS — M79651 Pain in right thigh: Secondary | ICD-10-CM | POA: Diagnosis not present

## 2017-01-30 DIAGNOSIS — Z79891 Long term (current) use of opiate analgesic: Secondary | ICD-10-CM | POA: Diagnosis not present

## 2017-01-30 DIAGNOSIS — M25552 Pain in left hip: Secondary | ICD-10-CM | POA: Diagnosis not present

## 2017-01-30 DIAGNOSIS — F1120 Opioid dependence, uncomplicated: Secondary | ICD-10-CM | POA: Diagnosis not present

## 2017-01-30 MED FILL — SUBOXONE 8 MG-2 MG SL FILM: 8-2 | 30 days supply | Qty: 90 | Fill #0

## 2017-01-31 MED FILL — ATORVASTATIN 40 MG TABLET: 40 | 90 days supply | Qty: 90 | Fill #0

## 2017-01-31 MED FILL — METOPROLOL SUCC ER 100 MG T: 100 | 90 days supply | Qty: 90 | Fill #0

## 2017-01-31 MED FILL — BUPROPION HCL XL 300 MG TAB: 300 | 30 days supply | Qty: 30 | Fill #0

## 2017-02-02 ENCOUNTER — Ambulatory Visit: Payer: Managed Care, Other (non HMO) | Admitting: Registered"

## 2017-02-06 ENCOUNTER — Ambulatory Visit (INDEPENDENT_AMBULATORY_CARE_PROVIDER_SITE_OTHER): Payer: Managed Care, Other (non HMO) | Admitting: Orthopaedic Surgery

## 2017-02-08 ENCOUNTER — Ambulatory Visit (INDEPENDENT_AMBULATORY_CARE_PROVIDER_SITE_OTHER): Payer: Managed Care, Other (non HMO) | Admitting: Physician Assistant

## 2017-02-13 MED FILL — VIIBRYD 40 MG TABLET: 40 | 30 days supply | Qty: 30 | Fill #0

## 2017-02-21 DIAGNOSIS — E291 Testicular hypofunction: Secondary | ICD-10-CM | POA: Diagnosis not present

## 2017-02-21 MED FILL — METFORMIN HCL ER 500 MG TAB: 500 | 30 days supply | Qty: 120 | Fill #0

## 2017-02-22 MED FILL — ALPRAZolam 1 MG TABS: 1 | 30 days supply | Qty: 60 | Fill #1

## 2017-03-01 DIAGNOSIS — G894 Chronic pain syndrome: Secondary | ICD-10-CM | POA: Diagnosis not present

## 2017-03-01 DIAGNOSIS — F1120 Opioid dependence, uncomplicated: Secondary | ICD-10-CM | POA: Diagnosis not present

## 2017-03-01 DIAGNOSIS — Z79891 Long term (current) use of opiate analgesic: Secondary | ICD-10-CM | POA: Diagnosis not present

## 2017-03-01 DIAGNOSIS — G541 Lumbosacral plexus disorders: Secondary | ICD-10-CM | POA: Diagnosis not present

## 2017-03-01 DIAGNOSIS — M545 Low back pain: Secondary | ICD-10-CM | POA: Diagnosis not present

## 2017-03-01 DIAGNOSIS — M25552 Pain in left hip: Secondary | ICD-10-CM | POA: Diagnosis not present

## 2017-03-01 DIAGNOSIS — G603 Idiopathic progressive neuropathy: Secondary | ICD-10-CM | POA: Diagnosis not present

## 2017-03-01 DIAGNOSIS — M546 Pain in thoracic spine: Secondary | ICD-10-CM | POA: Diagnosis not present

## 2017-03-01 DIAGNOSIS — M79651 Pain in right thigh: Secondary | ICD-10-CM | POA: Diagnosis not present

## 2017-03-01 DIAGNOSIS — M79652 Pain in left thigh: Secondary | ICD-10-CM | POA: Diagnosis not present

## 2017-03-01 MED FILL — SUBOXONE 8 MG-2 MG SL FILM: 8-2 | 30 days supply | Qty: 90 | Fill #0

## 2017-03-06 MED FILL — BUPROPION HCL XL 300 MG TAB: 300 | 30 days supply | Qty: 30 | Fill #1

## 2017-03-17 MED FILL — VIIBRYD 40 MG TABLET: 40 | 30 days supply | Qty: 30 | Fill #1

## 2017-03-23 MED FILL — ALPRAZolam 1 MG TABS: 1 | 30 days supply | Qty: 60 | Fill #2

## 2017-03-24 DIAGNOSIS — E291 Testicular hypofunction: Secondary | ICD-10-CM | POA: Diagnosis not present

## 2017-03-27 MED FILL — METFORMIN HCL ER 500 MG TAB: 500 | 30 days supply | Qty: 120 | Fill #1

## 2017-03-30 DIAGNOSIS — G894 Chronic pain syndrome: Secondary | ICD-10-CM | POA: Diagnosis not present

## 2017-03-30 DIAGNOSIS — M79652 Pain in left thigh: Secondary | ICD-10-CM | POA: Diagnosis not present

## 2017-03-30 DIAGNOSIS — G603 Idiopathic progressive neuropathy: Secondary | ICD-10-CM | POA: Diagnosis not present

## 2017-03-30 DIAGNOSIS — G541 Lumbosacral plexus disorders: Secondary | ICD-10-CM | POA: Diagnosis not present

## 2017-03-30 DIAGNOSIS — M546 Pain in thoracic spine: Secondary | ICD-10-CM | POA: Diagnosis not present

## 2017-03-31 MED FILL — SUBOXONE 8 MG-2 MG SL FILM: 8-2 | 7 days supply | Qty: 21 | Fill #0

## 2017-04-05 MED FILL — BUPROPION HCL XL 300 MG TAB: 300 | 30 days supply | Qty: 30 | Fill #2

## 2017-04-06 MED FILL — SUBOXONE 8 MG-2 MG SL FILM: 8-2 | 23 days supply | Qty: 69 | Fill #1

## 2017-04-17 MED FILL — VIIBRYD 40 MG TABLET: 40 | 30 days supply | Qty: 30 | Fill #2

## 2017-04-18 DIAGNOSIS — E291 Testicular hypofunction: Secondary | ICD-10-CM | POA: Diagnosis not present

## 2017-04-18 DIAGNOSIS — E782 Mixed hyperlipidemia: Secondary | ICD-10-CM | POA: Diagnosis not present

## 2017-04-18 DIAGNOSIS — R109 Unspecified abdominal pain: Secondary | ICD-10-CM | POA: Diagnosis not present

## 2017-04-18 DIAGNOSIS — F419 Anxiety disorder, unspecified: Secondary | ICD-10-CM | POA: Diagnosis not present

## 2017-04-18 DIAGNOSIS — I10 Essential (primary) hypertension: Secondary | ICD-10-CM | POA: Diagnosis not present

## 2017-04-18 DIAGNOSIS — E1165 Type 2 diabetes mellitus with hyperglycemia: Secondary | ICD-10-CM | POA: Diagnosis not present

## 2017-04-18 DIAGNOSIS — Z794 Long term (current) use of insulin: Secondary | ICD-10-CM | POA: Diagnosis not present

## 2017-04-18 DIAGNOSIS — F324 Major depressive disorder, single episode, in partial remission: Secondary | ICD-10-CM | POA: Diagnosis not present

## 2017-04-18 DIAGNOSIS — Z125 Encounter for screening for malignant neoplasm of prostate: Secondary | ICD-10-CM | POA: Diagnosis not present

## 2017-04-21 MED FILL — ALPRAZolam 1 MG TABS: 1 | 30 days supply | Qty: 60 | Fill #0

## 2017-04-21 MED FILL — ONDANSETRON HCL 8 MG TABLET: 8 | 10 days supply | Qty: 30 | Fill #0

## 2017-04-25 DIAGNOSIS — I10 Essential (primary) hypertension: Secondary | ICD-10-CM | POA: Diagnosis not present

## 2017-04-25 DIAGNOSIS — E782 Mixed hyperlipidemia: Secondary | ICD-10-CM | POA: Diagnosis not present

## 2017-04-25 DIAGNOSIS — Z125 Encounter for screening for malignant neoplasm of prostate: Secondary | ICD-10-CM | POA: Diagnosis not present

## 2017-04-26 DIAGNOSIS — F1120 Opioid dependence, uncomplicated: Secondary | ICD-10-CM | POA: Diagnosis not present

## 2017-05-02 DIAGNOSIS — Z96641 Presence of right artificial hip joint: Secondary | ICD-10-CM | POA: Diagnosis not present

## 2017-05-02 DIAGNOSIS — I10 Essential (primary) hypertension: Secondary | ICD-10-CM | POA: Diagnosis not present

## 2017-05-02 DIAGNOSIS — F1120 Opioid dependence, uncomplicated: Secondary | ICD-10-CM | POA: Diagnosis not present

## 2017-05-02 DIAGNOSIS — Z79899 Other long term (current) drug therapy: Secondary | ICD-10-CM | POA: Diagnosis not present

## 2017-05-02 MED FILL — IBUPROFEN 800 MG TAB: 800 | 30 days supply | Qty: 90 | Fill #0

## 2017-05-02 MED FILL — METOPROLOL SUCC ER 100 MG T: 100 | 30 days supply | Qty: 30 | Fill #1

## 2017-05-02 MED FILL — SUBOXONE 8 MG-2 MG SL FILM: 8-2 | 15 days supply | Qty: 30 | Fill #0

## 2017-05-05 MED FILL — METFORMIN HCL ER 500 MG TAB: 500 | 30 days supply | Qty: 120 | Fill #2

## 2017-05-05 MED FILL — BUPROPION HCL XL 300 MG TAB: 300 | 30 days supply | Qty: 30 | Fill #3

## 2017-05-05 MED FILL — ATORVASTATIN 40 MG TABLET: 40 | 30 days supply | Qty: 30 | Fill #1

## 2017-05-16 DIAGNOSIS — Z96641 Presence of right artificial hip joint: Secondary | ICD-10-CM | POA: Diagnosis not present

## 2017-05-16 DIAGNOSIS — F1120 Opioid dependence, uncomplicated: Secondary | ICD-10-CM | POA: Diagnosis not present

## 2017-05-16 DIAGNOSIS — Z79899 Other long term (current) drug therapy: Secondary | ICD-10-CM | POA: Diagnosis not present

## 2017-05-16 DIAGNOSIS — M1612 Unilateral primary osteoarthritis, left hip: Secondary | ICD-10-CM | POA: Diagnosis not present

## 2017-05-16 MED FILL — VIIBRYD 40 MG TABLET: 40 | 30 days supply | Qty: 30 | Fill #3

## 2017-05-17 MED FILL — SUBOXONE 8 MG-2 MG SL FILM: 8-2 | 30 days supply | Qty: 60 | Fill #0

## 2017-05-22 MED FILL — ALPRAZolam 1 MG TABS: 1 | 30 days supply | Qty: 60 | Fill #1

## 2017-06-01 DIAGNOSIS — F419 Anxiety disorder, unspecified: Secondary | ICD-10-CM | POA: Diagnosis not present

## 2017-06-01 DIAGNOSIS — I10 Essential (primary) hypertension: Secondary | ICD-10-CM | POA: Diagnosis not present

## 2017-06-01 DIAGNOSIS — R0789 Other chest pain: Secondary | ICD-10-CM | POA: Diagnosis not present

## 2017-06-01 DIAGNOSIS — E1165 Type 2 diabetes mellitus with hyperglycemia: Secondary | ICD-10-CM | POA: Diagnosis not present

## 2017-06-01 DIAGNOSIS — F324 Major depressive disorder, single episode, in partial remission: Secondary | ICD-10-CM | POA: Diagnosis not present

## 2017-06-01 DIAGNOSIS — E782 Mixed hyperlipidemia: Secondary | ICD-10-CM | POA: Diagnosis not present

## 2017-06-01 DIAGNOSIS — E291 Testicular hypofunction: Secondary | ICD-10-CM | POA: Diagnosis not present

## 2017-06-05 MED FILL — BUPROPION HCL XL 300 MG TAB: 300 | 30 days supply | Qty: 30 | Fill #0

## 2017-06-05 MED FILL — ATORVASTATIN 40 MG TABLET: 40 | 30 days supply | Qty: 30 | Fill #0

## 2017-06-05 MED FILL — METOPROLOL SUCC ER 100 MG T: 100 | 30 days supply | Qty: 30 | Fill #0

## 2017-06-05 MED FILL — METFORMIN HCL ER 500 MG TAB: 500 | 30 days supply | Qty: 120 | Fill #0

## 2017-06-14 ENCOUNTER — Ambulatory Visit (INDEPENDENT_AMBULATORY_CARE_PROVIDER_SITE_OTHER): Payer: 59

## 2017-06-14 ENCOUNTER — Ambulatory Visit (INDEPENDENT_AMBULATORY_CARE_PROVIDER_SITE_OTHER): Payer: 59 | Admitting: Physician Assistant

## 2017-06-14 DIAGNOSIS — M5441 Lumbago with sciatica, right side: Secondary | ICD-10-CM | POA: Diagnosis not present

## 2017-06-14 DIAGNOSIS — G8929 Other chronic pain: Secondary | ICD-10-CM

## 2017-06-14 NOTE — Progress Notes (Signed)
Office Visit Note   Patient: Brandon Gordon           Date of Birth: 10/13/1971           MRN: [id number] Visit Date: 06/14/2017              Requested by: Brandon BlamerHarris, William, MD [phone] Brandon Gordon [address] [place], [place] [address] PCP: Brandon BlamerHarris, William, MD   Assessment & Plan: Visit Diagnoses:  1. Chronic right-sided low back pain with right-sided sciatica     Plan: Open MRI of his lumbar spine r/o HNP. Follow up after the MRI to go over results and discuss further treatment.   Follow-Up Instructions: Return for after MRI.   Orders:  Orders Placed This Encounter  Procedures  . XR Lumbar Spine 2-3 Views  . MR Lumbar Spine w/o contrast   No orders of the defined types were placed in this encounter.     Procedures: No procedures performed   Clinical Data: No additional findings.   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 He has pain down the right leg in the calf and numbness into both legs from the buttocks to the calves. Tenderness injuries. He states he has some pain on the right knee goes away with the after urination. He denies any dysuria. He does have a history of kidney stones. States pain does not awaken him. But at worst is 8 out of 10 pain. He states stretching's helped. His wife is a physical therapist is helping with this. He does state that he is constantly uncomfortable. Pains worse with standing long periods of time. He's had a history of epidural steroid injections by Dr. Alvester Gordon with the last one being 925 and 15. States overall the pain is becoming worse.   Review of System  No fevers chills dysuria. Positive for low back pain with radicular symptoms down both legs to the calves. Objective: Vital Signs: There were no vitals taken for this visit.  Physical Exam    Constitutional: He is oriented to person, place, and time. He appears well-developed and well-nourished. No distress.  Cardiovascular: Intact distal pulses.   Pulmonary/Chest: Effort normal.  Neurological: He is alert and oriented to person, place, and time.  Psychiatric: He has a normal mood and affect. His behavior is normal.    Ortho Exam 5 out of 5 strength throughout the lower extremities against resistance. Negative straight leg raise bilaterally. Has tight hamstrings bilaterally. He has limited flexion-extension of the lumbar spine. Good range of the right hip without pain. Left hip he has limited internal and external rotation with some discomfort.   Specialty Comments:  No specialty comments available.  Imaging: Xr Lumbar Spine 2-3 Views  Result Date: 06/14/2017 AP and lateral views lumbar spine: Shows no acute fracture. Decreased disc space at L5 1 L2 and L3 5 S1. Slight anterior spinal stenosis L5 on S1. Generative changes throughout the lumbar spine with endplate spurring. No bony lesions.    PMFS History: There are no active problems to display for this patient.  Past Medical History:  Diagnosis Date  . Anxiety   . At risk for sleep apnea    STOP-BANG= 5      SENT TO PCP 04-05-2016  . History of kidney stones   . Hyperlipidemia   .  Hypertension   . Left ureteral stone   . OA (osteoarthritis)    back, hip  . Type 2 diabetes mellitus (HCC)     No family history on file.  Past Surgical History:  Procedure Laterality Date  . TOTAL HIP ARTHROPLASTY Right 04-22-2011   Social History   Occupational History  . Not on file.   Social History Main Topics  . Smoking status: Never Smoker  . Smokeless tobacco: Never Used  . Alcohol use 0.0 oz/week  . Drug use: No  . Sexual activity: Yes    Birth control/ protection: None

## 2017-06-15 DIAGNOSIS — E1169 Type 2 diabetes mellitus with other specified complication: Secondary | ICD-10-CM | POA: Diagnosis not present

## 2017-06-15 DIAGNOSIS — F1120 Opioid dependence, uncomplicated: Secondary | ICD-10-CM | POA: Diagnosis not present

## 2017-06-15 DIAGNOSIS — E785 Hyperlipidemia, unspecified: Secondary | ICD-10-CM | POA: Diagnosis not present

## 2017-06-15 DIAGNOSIS — Z79899 Other long term (current) drug therapy: Secondary | ICD-10-CM | POA: Diagnosis not present

## 2017-06-15 DIAGNOSIS — I10 Essential (primary) hypertension: Secondary | ICD-10-CM | POA: Diagnosis not present

## 2017-06-15 MED FILL — VIIBRYD 40 MG TABLET: 40 | 30 days supply | Qty: 30 | Fill #0

## 2017-06-16 MED FILL — SUBOXONE 8 MG-2 MG SL FILM: 8-2 | 30 days supply | Qty: 60 | Fill #0

## 2017-06-20 MED FILL — ALPRAZolam 1 MG TABS: 1 | 30 days supply | Qty: 60 | Fill #2

## 2017-06-28 ENCOUNTER — Ambulatory Visit
Admission: RE | Admit: 2017-06-28 | Discharge: 2017-06-28 | Disposition: A | Discharge status: Home / Self Care | Payer: 59 | Source: Ambulatory Visit | Attending: Physician Assistant | Admitting: Physician Assistant

## 2017-06-28 ENCOUNTER — Ambulatory Visit (INDEPENDENT_AMBULATORY_CARE_PROVIDER_SITE_OTHER): Payer: 59 | Admitting: Physician Assistant

## 2017-06-28 DIAGNOSIS — M5126 Other intervertebral disc displacement, lumbar region: Secondary | ICD-10-CM | POA: Diagnosis not present

## 2017-06-28 DIAGNOSIS — M5441 Lumbago with sciatica, right side: Principal | ICD-10-CM

## 2017-06-28 DIAGNOSIS — G8929 Other chronic pain: Secondary | ICD-10-CM

## 2017-06-28 DIAGNOSIS — M5136 Other intervertebral disc degeneration, lumbar region: Secondary | ICD-10-CM | POA: Diagnosis not present

## 2017-07-03 ENCOUNTER — Ambulatory Visit (INDEPENDENT_AMBULATORY_CARE_PROVIDER_SITE_OTHER): Payer: 59 | Admitting: Physician Assistant

## 2017-07-03 ENCOUNTER — Encounter (INDEPENDENT_AMBULATORY_CARE_PROVIDER_SITE_OTHER): Payer: Self-pay | Admitting: Physician Assistant

## 2017-07-03 VITALS — Ht 78.0 in | Wt 256.0 lb

## 2017-07-03 DIAGNOSIS — M9983 Other biomechanical lesions of lumbar region: Secondary | ICD-10-CM | POA: Diagnosis not present

## 2017-07-03 DIAGNOSIS — M48061 Spinal stenosis, lumbar region without neurogenic claudication: Secondary | ICD-10-CM

## 2017-07-03 NOTE — Progress Notes (Signed)
   Office Visit Note   Patient: Brandon Gordon           Date of Birth: 09/15/1971           MRN: [id number] Visit Date: 07/03/2017              Requested by: Johny BlamerHarris, William, MD [phone] [address] [place], [place] [address] PCP: Johny BlamerHarris, William, MD   Assessment & Plan: Visit Diagnoses: No diagnosis found.  Plan: We will send him to Dr. Alvester MorinNewton for an epidural steroid injection hopefully this will help with the radicular symptoms down his legs into the plantar aspect of his feet. He'll follow with us month after the injections to see what type of response is had.  Follow-Up Instructions: Return in about 4 weeks (around 07/31/2017) for AFTER ESI WITH [facility].   Orders:  No orders of the defined types were placed in this encounter.  No orders of the defined types were placed in this encounter.     Procedures: No procedures performed   Clinical Data: No additional findings.   Subjective: Chief Complaint  Patient presents with  . Lower Back - Pain, Follow-up    HPI [name] returns today to review the MRI findings of his lumbar spine. He states that his pain is slowly dissipating. Pain radiates down the legs medial calf into the medial aspect of the foot in the plantar aspect of the feet. Pain to be 5-6 out of 10 pain worse. He last had epidural steroid injections bilateral foraminal L4 July 2011. MRI of the lumbar spine dated 06/28/2017 showed mild bilateral foraminal narrowing at L4-5 and a grade 1 anterior spondylolisthesis at L5-S1. Images are reviewed with the patient today. Review of Systems Please see history of present illness  Objective: Vital Signs: Ht 6\' 6"  (1.981 m)   Wt 256 lb (116.1 kg)   BMI 29.58 kg/m   Physical Exam  Ortho Exam  Specialty Comments:  No specialty comments available.  Imaging: No results found.   PMFS History: There are no active problems to display for this patient.  Past Medical History:  Diagnosis Date  . Anxiety     . At risk for sleep apnea    STOP-BANG= 5      SENT TO PCP 04-05-2016  . History of kidney stones   . Hyperlipidemia   . Hypertension   . Left ureteral stone   . OA (osteoarthritis)    back, hip  . Type 2 diabetes mellitus (HCC)     No family history on file.  Past Surgical History:  Procedure Laterality Date  . TOTAL HIP ARTHROPLASTY Right 04-22-2011   Social History   Occupational History  . Not on file.   Social History Main Topics  . Smoking status: Never Smoker  . Smokeless tobacco: Never Used  . Alcohol use 0.0 oz/week  . Drug use: No  . Sexual activity: Yes    Birth control/ protection: None

## 2017-07-07 MED FILL — BUPROPION HCL XL 300 MG TAB: 300 | 30 days supply | Qty: 30 | Fill #1

## 2017-07-07 MED FILL — ATORVASTATIN 40 MG TABLET: 40 | 30 days supply | Qty: 30 | Fill #1

## 2017-07-07 MED FILL — METOPROLOL SUCC ER 100 MG T: 100 | 30 days supply | Qty: 30 | Fill #1

## 2017-07-11 ENCOUNTER — Ambulatory Visit (INDEPENDENT_AMBULATORY_CARE_PROVIDER_SITE_OTHER): Payer: 59 | Admitting: Orthopedic Surgery

## 2017-07-11 ENCOUNTER — Ambulatory Visit (INDEPENDENT_AMBULATORY_CARE_PROVIDER_SITE_OTHER): Payer: 59

## 2017-07-11 DIAGNOSIS — R0781 Pleurodynia: Secondary | ICD-10-CM

## 2017-07-12 DIAGNOSIS — M5442 Lumbago with sciatica, left side: Secondary | ICD-10-CM | POA: Diagnosis not present

## 2017-07-12 DIAGNOSIS — G8929 Other chronic pain: Secondary | ICD-10-CM | POA: Diagnosis not present

## 2017-07-12 DIAGNOSIS — F1120 Opioid dependence, uncomplicated: Secondary | ICD-10-CM | POA: Diagnosis not present

## 2017-07-12 DIAGNOSIS — M5441 Lumbago with sciatica, right side: Secondary | ICD-10-CM | POA: Diagnosis not present

## 2017-07-12 MED FILL — GABAPENTIN 300 MG CAPSULE: 300 | 90 days supply | Qty: 270 | Fill #0

## 2017-07-13 ENCOUNTER — Encounter (INDEPENDENT_AMBULATORY_CARE_PROVIDER_SITE_OTHER): Payer: Self-pay | Admitting: Orthopedic Surgery

## 2017-07-13 NOTE — Progress Notes (Signed)
   Office Visit Note   Patient: Brandon Gordon           Date of Birth: 01/03/1971           MRN: [id number] Visit Date: 07/11/2017 Requested by: Johny BlamerHarris, William, MD [phone] [address] [place], [place] [address] PCP: Johny BlamerHarris, William, MD  Subjective: No chief complaint on file.   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  Still having some pain since that time.  Spent a little nauseated.  He Sweating after that injury.  He's had this happened 1 time before with similar constellation of symptoms and it resolved after a day.  He does not report any shortness of breath per se but does describe some difficulty with prolonged speaking.              ROS: All systems reviewed are negative as they relate to the chief complaint within the history of present illness.  Patient denies  fevers or chills.   Assessment & Plan: Visit Diagnoses:  1. Rib pain on left side     Plan: Impression is left rib pain normal radiographs and normal abdominal exam.  Plan is for observation with CT scanning tomorrow if he is no better.  I do want him to call me if he develops any shortness of breath or acute abdominal pain.  Follow-Up Instructions: Return if symptoms worsen or fail to improve.   Orders:  Orders Placed This Encounter  Procedures  . XR Ribs Unilateral Left   No orders of the defined types were placed in this encounter.     Procedures: No procedures performed   Clinical Data: No additional findings.  Objective: Vital Signs: There were no vitals taken for this visit.  Physical Exam:   Constitutional: Patient appears well-developed HEENT:  Head: Normocephalic Eyes:EOM are normal Neck: Normal range of motion Cardiovascular: Normal rate Pulmonary/chest: Effort normal Neurologic: Patient is alert Skin: Skin is warm Psychiatric: Patient has normal mood and affect    Ortho  Exam: Orthopedic exam demonstrates normal breath sounds in the lungs.  No real focal bruising or swelling in the rib area where the impact was.  Abdominal exam is benign with no rebound and no real tenderness to palpation around the abdominal region.  Specialty Comments:  No specialty comments available.  Imaging: No results found.   PMFS History: There are no active problems to display for this patient.  Past Medical History:  Diagnosis Date  . Anxiety   . At risk for sleep apnea    STOP-BANG= 5      SENT TO PCP 04-05-2016  . History of kidney stones   . Hyperlipidemia   . Hypertension   . Left ureteral stone   . OA (osteoarthritis)    back, hip  . Type 2 diabetes mellitus (HCC)     No family history on file.  Past Surgical History:  Procedure Laterality Date  . TOTAL HIP ARTHROPLASTY Right 04-22-2011   Social History   Occupational History  . Not on file.   Social History Main Topics  . Smoking status: Never Smoker  . Smokeless tobacco: Never Used  . Alcohol use 0.0 oz/week  . Drug use: No  . Sexual activity: Yes    Birth control/ protection: None

## 2017-07-14 MED FILL — BUPRENORPHINE HCL-NALOXONE: 8-2 | 30 days supply | Qty: 60 | Fill #0

## 2017-07-17 MED FILL — METFORMIN HCL ER 500 MG TAB: 500 | 30 days supply | Qty: 120 | Fill #1

## 2017-07-17 MED FILL — VIIBRYD 40 MG TABLET: 40 | 30 days supply | Qty: 30 | Fill #1

## 2017-07-19 ENCOUNTER — Ambulatory Visit (INDEPENDENT_AMBULATORY_CARE_PROVIDER_SITE_OTHER): Payer: Self-pay

## 2017-07-19 ENCOUNTER — Encounter (INDEPENDENT_AMBULATORY_CARE_PROVIDER_SITE_OTHER): Payer: Self-pay | Admitting: Physical Medicine and Rehabilitation

## 2017-07-19 ENCOUNTER — Ambulatory Visit (INDEPENDENT_AMBULATORY_CARE_PROVIDER_SITE_OTHER): Payer: 59 | Admitting: Physical Medicine and Rehabilitation

## 2017-07-19 VITALS — BP 131/81 | HR 72

## 2017-07-19 DIAGNOSIS — M5416 Radiculopathy, lumbar region: Secondary | ICD-10-CM

## 2017-07-19 MED ORDER — LIDOCAINE HCL (PF) 1 % IJ SOLN: 2.0000 mL | Freq: Once | INTRAMUSCULAR | Status: DC

## 2017-07-19 MED ORDER — BETAMETHASONE SOD PHOS & ACET 6 (3-3) MG/ML IJ SUSP: 12.0000 mg | Freq: Once | INTRAMUSCULAR | Status: DC

## 2017-07-19 MED FILL — ALPRAZolam 1 MG TABS: 1 | 30 days supply | Qty: 60 | Fill #0

## 2017-07-19 NOTE — Patient Instructions (Signed)

## 2017-07-19 NOTE — Progress Notes (Deleted)
Pain across lower back. Worse on right side. Down both legs to calf. Burning pain in buttocks. Worse with laying on right side. Sharp pains at times with certain movements.Numbness and tingling in both thighs.

## 2017-07-19 NOTE — Progress Notes (Unsigned)
Fluoro Time: 48 sec MGy: 57.28

## 2017-07-20 NOTE — Procedures (Signed)
Mr. Brandon Gordon is a very pleasant 46 year old gentleman initially seen in the past. Has chronic worsening low back pain and bilateral radicular type pain in the calves. He does have known listhesis very minimal at L4 on L5 with some potential lateral recess narrowing. No focal compression or herniation. He does have pain really in the muscle areas as well bilaterally. He is undergoing some physical therapy. His wife is a physical therapist. He also has a generalized anxiety disorder which I think is problematic since it does. He has failed conservative care otherwise.  Lumbosacral Transforaminal Epidural Steroid Injection - Infraneural Approach with Fluoroscopic Guidance  Patient: Brandon Gordon      Date of Birth: 11/08/1971 MRN: [id number] PCP: Johny BlamerHarris, William, MD      Visit Date: 07/19/2017   Universal Protocol:     Consent Given By: the patient  Position: PRONE   Additional Comments: Vital signs were monitored before and after the procedure. Patient was prepped and draped in the usual sterile fashion. The correct patient, procedure, and site was verified.   Injection Procedure Details:  Procedure Site One Meds Administered:  Meds ordered this encounter  Medications  . lidocaine (PF) (XYLOCAINE) 1 % injection 2 mL  . betamethasone acetate-betamethasone sodium phosphate (CELESTONE) injection 12 mg      Laterality: Bilateral  Location/Site:  L4-L5  Needle size: 22 G  Needle type: Spinal  Needle Placement: Transforaminal  Findings:  -Contrast Used: 1 mL iohexol 180 mg iodine/mL   -Comments: Excellent flow of contrast along the nerve and into the epidural space.  Procedure Details: After squaring off the end-plates of the desired vertebral level to get a true AP view, the C-arm was obliqued to the painful side so that the superior articulating process is positioned about 1/3 the length of the inferior endplate.  The needle was aimed toward the junction of the superior articular  process and the transverse process of the inferior vertebrae. The needle's initial entry is in the lower third of the foramen through Kambin's triangle. The soft tissues overlying this target were infiltrated with 2-3 ml. of 1% Lidocaine without Epinephrine.  The spinal needle was then inserted and advanced toward the target using a "trajectory" view along the fluoroscope beam.  Under AP and lateral visualization, the needle was advanced so it did not puncture dura and did not traverse medially beyond the 6 o'clock position of the pedicle. Bi-planar projections were used to confirm position. Aspiration was confirmed to be negative for CSF and/or blood. A 1-2 ml. volume of Isovue-250 was injected and flow of contrast was noted at each level. Radiographs were obtained for documentation purposes.   After attaining the desired flow of contrast documented above, a 0.5 to 1.0 ml test dose of 0.25% Marcaine was injected into each respective transforaminal space.  The patient was observed for 90 seconds post injection.  After no sensory deficits were reported, and normal lower extremity motor function was noted,   the above injectate was administered so that equal amounts of the injectate were placed at each foramen (level) into the transforaminal epidural space.   Additional Comments:  The patient tolerated the procedure well Dressing: Band-Aid    Post-procedure details: Patient was observed during the procedure. Post-procedure instructions were reviewed.  Patient left the clinic in stable condition.

## 2017-08-04 MED FILL — XULTOPHY 100 UNIT-3.6MG/ML: 100-3.6 | 75 days supply | Qty: 15 | Fill #0

## 2017-08-04 MED FILL — UNIFINE PENTIPS 31GX3/16": 31G X 5 MM | 90 days supply | Qty: 100 | Fill #0

## 2017-08-04 MED FILL — UNIFINE PENTIPS 31GX3/16: 31G X 5 MM | 90 days supply | Qty: 100 | Fill #0

## 2017-08-08 DIAGNOSIS — E1165 Type 2 diabetes mellitus with hyperglycemia: Secondary | ICD-10-CM | POA: Diagnosis not present

## 2017-08-08 DIAGNOSIS — I10 Essential (primary) hypertension: Secondary | ICD-10-CM | POA: Diagnosis not present

## 2017-08-08 DIAGNOSIS — F419 Anxiety disorder, unspecified: Secondary | ICD-10-CM | POA: Diagnosis not present

## 2017-08-08 DIAGNOSIS — F324 Major depressive disorder, single episode, in partial remission: Secondary | ICD-10-CM | POA: Diagnosis not present

## 2017-08-08 DIAGNOSIS — Z23 Encounter for immunization: Secondary | ICD-10-CM | POA: Diagnosis not present

## 2017-08-08 DIAGNOSIS — E782 Mixed hyperlipidemia: Secondary | ICD-10-CM | POA: Diagnosis not present

## 2017-08-08 DIAGNOSIS — E291 Testicular hypofunction: Secondary | ICD-10-CM | POA: Diagnosis not present

## 2017-08-08 MED FILL — METOPROLOL SUCC ER 100 MG T: 100 | 90 days supply | Qty: 90 | Fill #0

## 2017-08-08 MED FILL — ATORVASTATIN 40 MG TABLET: 40 | 90 days supply | Qty: 90 | Fill #0

## 2017-08-08 MED FILL — buPROPion HCL ER (XL) 300 M: 300 | 90 days supply | Qty: 90 | Fill #0

## 2017-08-09 DIAGNOSIS — Z79899 Other long term (current) drug therapy: Secondary | ICD-10-CM | POA: Diagnosis not present

## 2017-08-09 DIAGNOSIS — F1120 Opioid dependence, uncomplicated: Secondary | ICD-10-CM | POA: Diagnosis not present

## 2017-08-09 DIAGNOSIS — M5441 Lumbago with sciatica, right side: Secondary | ICD-10-CM | POA: Diagnosis not present

## 2017-08-09 DIAGNOSIS — I10 Essential (primary) hypertension: Secondary | ICD-10-CM | POA: Diagnosis not present

## 2017-08-09 DIAGNOSIS — M5442 Lumbago with sciatica, left side: Secondary | ICD-10-CM | POA: Diagnosis not present

## 2017-08-09 DIAGNOSIS — G8929 Other chronic pain: Secondary | ICD-10-CM | POA: Diagnosis not present

## 2017-08-10 MED FILL — VIIBRYD 40 MG TABLET: 40 | 90 days supply | Qty: 90 | Fill #0

## 2017-08-11 MED FILL — SUBOXONE 8 MG-2 MG SL FILM: 8-2 | 30 days supply | Qty: 60 | Fill #0

## 2017-08-18 MED FILL — ALPRAZolam 1 MG TABS: 1 | 30 days supply | Qty: 60 | Fill #0

## 2017-08-25 MED FILL — METFORMIN HCL ER 500 MG TAB: 500 | 30 days supply | Qty: 120 | Fill #0

## 2017-09-08 DIAGNOSIS — E291 Testicular hypofunction: Secondary | ICD-10-CM | POA: Diagnosis not present

## 2017-09-11 MED FILL — SUBOXONE 8 MG-2 MG SL FILM: 8-2 | 30 days supply | Qty: 60 | Fill #0

## 2017-09-18 MED FILL — ALPRAZolam 1 MG TABS: 1 | 30 days supply | Qty: 60 | Fill #0

## 2017-09-25 MED FILL — METFORMIN HCL ER 500 MG TAB: 500 | 30 days supply | Qty: 120 | Fill #1

## 2017-09-28 MED FILL — FREESTYLE LANCETS: 90 days supply | Qty: 100 | Fill #0

## 2017-09-28 MED FILL — FREESTYLE LITE TEST STRIP: 90 days supply | Qty: 100 | Fill #0

## 2017-09-28 MED FILL — FREESTYLE FREEDOM LITE METE: W/DEVICE | 30 days supply | Qty: 1 | Fill #0

## 2017-10-09 DIAGNOSIS — E1169 Type 2 diabetes mellitus with other specified complication: Secondary | ICD-10-CM | POA: Diagnosis not present

## 2017-10-09 DIAGNOSIS — Z79899 Other long term (current) drug therapy: Secondary | ICD-10-CM | POA: Diagnosis not present

## 2017-10-09 DIAGNOSIS — F1120 Opioid dependence, uncomplicated: Secondary | ICD-10-CM | POA: Diagnosis not present

## 2017-10-09 DIAGNOSIS — E291 Testicular hypofunction: Secondary | ICD-10-CM | POA: Diagnosis not present

## 2017-10-09 DIAGNOSIS — E669 Obesity, unspecified: Secondary | ICD-10-CM | POA: Diagnosis not present

## 2017-10-11 MED FILL — SUBOXONE 8 MG-2 MG SL FILM: 8-2 | 30 days supply | Qty: 60 | Fill #0

## 2017-10-17 MED FILL — ALPRAZolam 1 MG TABS: 1 | 30 days supply | Qty: 60 | Fill #0

## 2017-10-23 MED FILL — GABAPENTIN 300 MG CAPSULE: 300 | 90 days supply | Qty: 270 | Fill #1

## 2017-11-02 MED FILL — METFORMIN HCL ER 500 MG TAB: 500 | 30 days supply | Qty: 120 | Fill #2

## 2017-11-07 MED FILL — VIIBRYD 40 MG TABLET: 40 | 90 days supply | Qty: 90 | Fill #1

## 2017-11-07 MED FILL — BUPROPION HCL XL 300 MG TAB: 300 | 90 days supply | Qty: 90 | Fill #1

## 2017-11-07 MED FILL — ATORVASTATIN 40 MG TABLET: 40 | 90 days supply | Qty: 90 | Fill #1

## 2017-11-07 MED FILL — METOPROLOL SUCC ER 100 MG T: 100 | 90 days supply | Qty: 90 | Fill #1

## 2017-11-10 MED FILL — SUBOXONE 8 MG-2 MG SL FILM: 8-2 | 30 days supply | Qty: 60 | Fill #0

## 2017-11-15 DIAGNOSIS — E782 Mixed hyperlipidemia: Secondary | ICD-10-CM | POA: Diagnosis not present

## 2017-11-15 DIAGNOSIS — K219 Gastro-esophageal reflux disease without esophagitis: Secondary | ICD-10-CM | POA: Diagnosis not present

## 2017-11-15 DIAGNOSIS — E1165 Type 2 diabetes mellitus with hyperglycemia: Secondary | ICD-10-CM | POA: Diagnosis not present

## 2017-11-15 DIAGNOSIS — E291 Testicular hypofunction: Secondary | ICD-10-CM | POA: Diagnosis not present

## 2017-11-15 DIAGNOSIS — F324 Major depressive disorder, single episode, in partial remission: Secondary | ICD-10-CM | POA: Diagnosis not present

## 2017-11-15 DIAGNOSIS — I10 Essential (primary) hypertension: Secondary | ICD-10-CM | POA: Diagnosis not present

## 2017-11-15 MED FILL — PANTOPRAZOLE SOD DR 40 MG T: 40 | 30 days supply | Qty: 30 | Fill #0

## 2017-11-16 MED FILL — ALPRAZolam 1 MG TABS: 1 | 30 days supply | Qty: 60 | Fill #1

## 2017-11-21 DIAGNOSIS — L089 Local infection of the skin and subcutaneous tissue, unspecified: Secondary | ICD-10-CM | POA: Diagnosis not present

## 2017-11-21 DIAGNOSIS — E669 Obesity, unspecified: Secondary | ICD-10-CM | POA: Diagnosis not present

## 2017-11-21 DIAGNOSIS — E11628 Type 2 diabetes mellitus with other skin complications: Secondary | ICD-10-CM | POA: Diagnosis not present

## 2017-11-21 DIAGNOSIS — Z6831 Body mass index (BMI) 31.0-31.9, adult: Secondary | ICD-10-CM | POA: Diagnosis not present

## 2017-11-21 DIAGNOSIS — Z794 Long term (current) use of insulin: Secondary | ICD-10-CM | POA: Diagnosis not present

## 2017-11-21 MED FILL — CLINDAMYCIN HCL 300 MG CAPS: 300 | 10 days supply | Qty: 40 | Fill #0

## 2017-11-29 ENCOUNTER — Encounter (INDEPENDENT_AMBULATORY_CARE_PROVIDER_SITE_OTHER): Payer: Self-pay | Admitting: Physician Assistant

## 2017-11-29 ENCOUNTER — Ambulatory Visit (INDEPENDENT_AMBULATORY_CARE_PROVIDER_SITE_OTHER): Payer: 59 | Admitting: Physician Assistant

## 2017-11-29 ENCOUNTER — Ambulatory Visit (INDEPENDENT_AMBULATORY_CARE_PROVIDER_SITE_OTHER): Payer: 59

## 2017-11-29 DIAGNOSIS — M25512 Pain in left shoulder: Secondary | ICD-10-CM | POA: Diagnosis not present

## 2017-11-29 NOTE — Progress Notes (Signed)
Office Visit Note   Patient: Brandon Gordon           Date of Birth: 07/10/1971           MRN: [id number] Visit Date: 11/29/2017              Requested by: Johny BlamerHarris, William, MD [phone] [address] [place], [place] [id number] PCP: Johny BlamerHarris, William, MD   Assessment & Plan: Visit Diagnoses:  1. Acute pain of left shoulder     Plan: He will work on forward flexion exercises left shoulder, Cottam, pendulum and wall crawls.  He will follow-up with us on an as-needed basis or if pain persist or becomes worse.  Questions were encouraged and answered.  Follow-Up Instructions: Return if symptoms worsen or fail to improve.   Orders:  Orders Placed This Encounter  Procedures  . XR Shoulder Left   No orders of the defined types were placed in this encounter.     Procedures: No procedures performed   Clinical Data: No additional findings.   Subjective: Chief Complaint  Patient presents with  . Left Shoulder - Pain  . Left Hip - Pain  . Lower Back - Pain    HPI Mr. Brandon Gordon is a 46 year old male well-known to Dr. Magnus Gordon service comes in today due to a fall on ice at his company's parking lot.  He fell directly on his left shoulder due to the fact that he slipped on black ice.  States the left arm feels as if it "dead".  He did not actually have a subluxation of his shoulder he has had several dislocations of both shoulders but has been in some time.  He is having pain about the shoulder girdle.  He also states that he is having some slight discomfort like a "stinger left hip low back area.  He is having no radicular symptoms down either leg.  He did not lose consciousness or have any injury to his head. Review of Systems Please see HPI otherwise negative  Objective: Vital Signs: There were no vitals taken for this visit.  Physical Exam  Constitutional: He is oriented to person, place, and time. He appears well-developed and well-nourished. No distress.  Pulmonary/Chest:  Effort normal.  Neurological: He is alert and oriented to person, place, and time.  Skin: He is not diaphoretic.  Psychiatric: He has a normal mood and affect.    Ortho Exam Bilateral shoulders 5 out of 5 strength with external and internal rotation against resistance.  Forward flexion he has full forward flexion of the right shoulder left shoulder to present 170 degrees.  Negative impingement bilaterally.  Nontender about the left shoulder girdle.  There is no rashes skin lesions ulcerations erythema or  ecchymosis.  Radial pulses are 2+ bilaterally equal and symmetric.  Full motor full sensation bilateral hands to light touch.  Upper extremity strength testing 5 out of 5 bilaterally.  Specialty Comments:  No specialty comments available.  Imaging: Xr Shoulder Left  Result Date: 11/29/2017 Left shoulder 3 views: Shoulders well located.  Glenohumeral joint is well-maintained.  Acromioclavicular joint is well-maintained mild arthritic changes but no evidence of acute fracture or separation.    PMFS History: There are no active problems to display for this patient.  Past Medical History:  Diagnosis Date  . Anxiety   . At risk for sleep apnea    STOP-BANG= 5      SENT TO PCP 04-05-2016  . History of kidney stones   .  Hyperlipidemia   . Hypertension   . Left ureteral stone   . OA (osteoarthritis)    back, hip  . Type 2 diabetes mellitus (HCC)     No family history on file.  Past Surgical History:  Procedure Laterality Date  . TOTAL HIP ARTHROPLASTY Right 04-22-2011   Social History   Occupational History  . Not on file  Tobacco Use  . Smoking status: Never Smoker  . Smokeless tobacco: Never Used  Substance and Sexual Activity  . Alcohol use: Yes    Alcohol/week: 0.0 oz  . Drug use: No  . Sexual activity: Yes    Birth control/protection: None

## 2017-12-07 DIAGNOSIS — Z79899 Other long term (current) drug therapy: Secondary | ICD-10-CM | POA: Diagnosis not present

## 2017-12-07 DIAGNOSIS — F1120 Opioid dependence, uncomplicated: Secondary | ICD-10-CM | POA: Diagnosis not present

## 2017-12-08 MED FILL — METFORMIN HCL ER 500 MG TAB: 500 | 90 days supply | Qty: 360 | Fill #0

## 2017-12-08 MED FILL — SUBOXONE 8 MG-2 MG SL FILM: 8-2 | 30 days supply | Qty: 60 | Fill #0

## 2017-12-14 MED FILL — ALPRAZolam 1 MG TABS: 1 | 30 days supply | Qty: 60 | Fill #2

## 2018-01-09 MED FILL — SUBOXONE 8 MG-2 MG SL FILM: 8-2 | 30 days supply | Qty: 60 | Fill #0

## 2018-01-12 MED FILL — ALPRAZolam 1 MG TABS: 1 | 30 days supply | Qty: 60 | Fill #3

## 2018-01-19 DIAGNOSIS — E291 Testicular hypofunction: Secondary | ICD-10-CM | POA: Diagnosis not present

## 2018-02-07 DIAGNOSIS — G8929 Other chronic pain: Secondary | ICD-10-CM | POA: Diagnosis not present

## 2018-02-07 DIAGNOSIS — M5442 Lumbago with sciatica, left side: Secondary | ICD-10-CM | POA: Diagnosis not present

## 2018-02-07 DIAGNOSIS — Z79899 Other long term (current) drug therapy: Secondary | ICD-10-CM | POA: Diagnosis not present

## 2018-02-07 DIAGNOSIS — F1120 Opioid dependence, uncomplicated: Secondary | ICD-10-CM | POA: Diagnosis not present

## 2018-02-07 DIAGNOSIS — M5441 Lumbago with sciatica, right side: Secondary | ICD-10-CM | POA: Diagnosis not present

## 2018-02-07 MED FILL — SUBOXONE 8 MG-2 MG SL FILM: 8-2 | 30 days supply | Qty: 60 | Fill #0

## 2018-02-09 MED FILL — ALPRAZolam 1 MG TABS: 1 | 30 days supply | Qty: 60 | Fill #0

## 2018-02-12 MED FILL — ATORVASTATIN 40 MG TABLET: 40 | 90 days supply | Qty: 90 | Fill #0

## 2018-02-12 MED FILL — VIIBRYD 40 MG TABLET: 40 | 30 days supply | Qty: 30 | Fill #2

## 2018-02-12 MED FILL — METOPROLOL SUCCINATE ER 100: 100 | 90 days supply | Qty: 90 | Fill #0

## 2018-02-12 MED FILL — BUPROPION HCL XL 300 MG TAB: 300 | 90 days supply | Qty: 90 | Fill #0

## 2018-02-15 DIAGNOSIS — E291 Testicular hypofunction: Secondary | ICD-10-CM | POA: Diagnosis not present

## 2018-02-15 DIAGNOSIS — F324 Major depressive disorder, single episode, in partial remission: Secondary | ICD-10-CM | POA: Diagnosis not present

## 2018-02-15 DIAGNOSIS — E1165 Type 2 diabetes mellitus with hyperglycemia: Secondary | ICD-10-CM | POA: Diagnosis not present

## 2018-02-15 DIAGNOSIS — I10 Essential (primary) hypertension: Secondary | ICD-10-CM | POA: Diagnosis not present

## 2018-02-15 DIAGNOSIS — M549 Dorsalgia, unspecified: Secondary | ICD-10-CM | POA: Diagnosis not present

## 2018-02-15 DIAGNOSIS — F419 Anxiety disorder, unspecified: Secondary | ICD-10-CM | POA: Diagnosis not present

## 2018-02-15 MED FILL — diazePAM 2 MG TABS: 2 | 30 days supply | Qty: 60 | Fill #0

## 2018-03-06 MED FILL — XULTOPHY 100 UNIT-3.6MG/ML: 100-3.6 | 30 days supply | Qty: 15 | Fill #0

## 2018-03-09 MED FILL — SUBOXONE 8 MG-2 MG SL FILM: 8-2 | 30 days supply | Qty: 60 | Fill #0

## 2018-03-15 DIAGNOSIS — E291 Testicular hypofunction: Secondary | ICD-10-CM | POA: Diagnosis not present

## 2018-03-15 MED FILL — VIIBRYD 40 MG TABLET: 40 | 90 days supply | Qty: 90 | Fill #0

## 2018-03-16 MED FILL — GABAPENTIN 600 MG TAB: 600 | 90 days supply | Qty: 180 | Fill #0

## 2018-03-16 MED FILL — METFORMIN HCL ER 500 MG TAB: 500 | 90 days supply | Qty: 360 | Fill #1

## 2018-03-20 MED FILL — ALPRAZolam 1 MG TABS: 1 | 30 days supply | Qty: 60 | Fill #0

## 2018-04-05 DIAGNOSIS — Z79899 Other long term (current) drug therapy: Secondary | ICD-10-CM | POA: Diagnosis not present

## 2018-04-05 DIAGNOSIS — M5442 Lumbago with sciatica, left side: Secondary | ICD-10-CM | POA: Diagnosis not present

## 2018-04-05 DIAGNOSIS — M5441 Lumbago with sciatica, right side: Secondary | ICD-10-CM | POA: Diagnosis not present

## 2018-04-05 DIAGNOSIS — G8929 Other chronic pain: Secondary | ICD-10-CM | POA: Diagnosis not present

## 2018-04-09 MED FILL — BUPRENORPHINE HCL-NALOXONE: 8-2 | 30 days supply | Qty: 60 | Fill #0

## 2018-04-12 DIAGNOSIS — R509 Fever, unspecified: Secondary | ICD-10-CM | POA: Diagnosis not present

## 2018-04-12 DIAGNOSIS — J069 Acute upper respiratory infection, unspecified: Secondary | ICD-10-CM | POA: Diagnosis not present

## 2018-04-12 DIAGNOSIS — E291 Testicular hypofunction: Secondary | ICD-10-CM | POA: Diagnosis not present

## 2018-04-17 MED FILL — ALPRAZolam 1 MG TABS: 1 | 30 days supply | Qty: 60 | Fill #1

## 2018-05-08 MED FILL — BUPRENORPHINE HCL-NALOXONE: 8-2 | 30 days supply | Qty: 60 | Fill #0

## 2018-05-11 DIAGNOSIS — E291 Testicular hypofunction: Secondary | ICD-10-CM | POA: Diagnosis not present

## 2018-05-11 MED FILL — METOPROLOL SUCCINATE ER 100: 100 | 90 days supply | Qty: 90 | Fill #0

## 2018-05-16 DIAGNOSIS — M159 Polyosteoarthritis, unspecified: Secondary | ICD-10-CM | POA: Diagnosis not present

## 2018-05-16 DIAGNOSIS — F419 Anxiety disorder, unspecified: Secondary | ICD-10-CM | POA: Diagnosis not present

## 2018-05-16 DIAGNOSIS — Z7984 Long term (current) use of oral hypoglycemic drugs: Secondary | ICD-10-CM | POA: Diagnosis not present

## 2018-05-16 DIAGNOSIS — Z125 Encounter for screening for malignant neoplasm of prostate: Secondary | ICD-10-CM | POA: Diagnosis not present

## 2018-05-16 DIAGNOSIS — E782 Mixed hyperlipidemia: Secondary | ICD-10-CM | POA: Diagnosis not present

## 2018-05-16 DIAGNOSIS — E1165 Type 2 diabetes mellitus with hyperglycemia: Secondary | ICD-10-CM | POA: Diagnosis not present

## 2018-05-16 DIAGNOSIS — E291 Testicular hypofunction: Secondary | ICD-10-CM | POA: Diagnosis not present

## 2018-05-16 DIAGNOSIS — I10 Essential (primary) hypertension: Secondary | ICD-10-CM | POA: Diagnosis not present

## 2018-05-16 MED FILL — ALPRAZolam 1 MG TABS: 1 | 30 days supply | Qty: 60 | Fill #2

## 2018-05-21 MED FILL — BUPROPION HCL XL 300 MG TAB: 300 | 90 days supply | Qty: 90 | Fill #0

## 2018-05-21 MED FILL — ATORVASTATIN 40 MG TABLET: 40 | 90 days supply | Qty: 90 | Fill #0

## 2018-05-22 DIAGNOSIS — Z125 Encounter for screening for malignant neoplasm of prostate: Secondary | ICD-10-CM | POA: Diagnosis not present

## 2018-05-22 DIAGNOSIS — I10 Essential (primary) hypertension: Secondary | ICD-10-CM | POA: Diagnosis not present

## 2018-05-22 DIAGNOSIS — E1165 Type 2 diabetes mellitus with hyperglycemia: Secondary | ICD-10-CM | POA: Diagnosis not present

## 2018-05-22 DIAGNOSIS — E782 Mixed hyperlipidemia: Secondary | ICD-10-CM | POA: Diagnosis not present

## 2018-05-22 DIAGNOSIS — E291 Testicular hypofunction: Secondary | ICD-10-CM | POA: Diagnosis not present

## 2018-05-25 MED FILL — ONDANSETRON HCL 8 MG TABLET: 8 | 10 days supply | Qty: 30 | Fill #0

## 2018-06-06 DIAGNOSIS — M5442 Lumbago with sciatica, left side: Secondary | ICD-10-CM | POA: Diagnosis not present

## 2018-06-06 DIAGNOSIS — F1120 Opioid dependence, uncomplicated: Secondary | ICD-10-CM | POA: Diagnosis not present

## 2018-06-06 DIAGNOSIS — M5441 Lumbago with sciatica, right side: Secondary | ICD-10-CM | POA: Diagnosis not present

## 2018-06-06 DIAGNOSIS — Z79899 Other long term (current) drug therapy: Secondary | ICD-10-CM | POA: Diagnosis not present

## 2018-06-06 DIAGNOSIS — G8929 Other chronic pain: Secondary | ICD-10-CM | POA: Diagnosis not present

## 2018-06-06 MED FILL — BUPRENORPHINE HCL-NALOXONE: 8-2 | 30 days supply | Qty: 60 | Fill #0

## 2018-06-12 MED FILL — GABAPENTIN 600 MG TAB: 600 | 90 days supply | Qty: 180 | Fill #1

## 2018-06-12 MED FILL — VIIBRYD 40 MG TABLET: 40 | 90 days supply | Qty: 90 | Fill #0

## 2018-06-14 MED FILL — ALPRAZolam 1 MG TABS: 1 | 30 days supply | Qty: 60 | Fill #3

## 2018-06-26 DIAGNOSIS — E291 Testicular hypofunction: Secondary | ICD-10-CM | POA: Diagnosis not present

## 2018-07-06 MED FILL — METFORMIN HCL ER 500 MG TAB: 500 | 90 days supply | Qty: 360 | Fill #2

## 2018-07-06 MED FILL — BUPRENORPHINE HCL-NALOXONE: 8-2 | 30 days supply | Qty: 60 | Fill #0

## 2018-07-13 MED FILL — ALPRAZolam 1 MG TABS: 1 | 30 days supply | Qty: 60 | Fill #0

## 2018-07-27 DIAGNOSIS — E291 Testicular hypofunction: Secondary | ICD-10-CM | POA: Diagnosis not present

## 2018-08-06 DIAGNOSIS — Z79899 Other long term (current) drug therapy: Secondary | ICD-10-CM | POA: Diagnosis not present

## 2018-08-06 DIAGNOSIS — F1120 Opioid dependence, uncomplicated: Secondary | ICD-10-CM | POA: Diagnosis not present

## 2018-08-06 MED FILL — BUPRENORPHINE HCL-NALOXONE: 8-2 | 30 days supply | Qty: 60 | Fill #0

## 2018-08-15 MED FILL — BuPROPion HCL ER (XL) 300 M: 300 | 90 days supply | Qty: 90 | Fill #0

## 2018-08-15 MED FILL — ALPRAZolam 1 MG TABS: 1 | 30 days supply | Qty: 60 | Fill #1

## 2018-08-15 MED FILL — METOPROLOL SUCCINATE ER 100: 100 | 90 days supply | Qty: 90 | Fill #0

## 2018-08-15 MED FILL — ATORVASTATIN 40 MG TABLET: 40 | 90 days supply | Qty: 90 | Fill #0

## 2018-08-17 DIAGNOSIS — Z794 Long term (current) use of insulin: Secondary | ICD-10-CM | POA: Diagnosis not present

## 2018-08-17 DIAGNOSIS — I10 Essential (primary) hypertension: Secondary | ICD-10-CM | POA: Diagnosis not present

## 2018-08-17 DIAGNOSIS — F324 Major depressive disorder, single episode, in partial remission: Secondary | ICD-10-CM | POA: Diagnosis not present

## 2018-08-17 DIAGNOSIS — E782 Mixed hyperlipidemia: Secondary | ICD-10-CM | POA: Diagnosis not present

## 2018-08-17 DIAGNOSIS — E1165 Type 2 diabetes mellitus with hyperglycemia: Secondary | ICD-10-CM | POA: Diagnosis not present

## 2018-08-17 DIAGNOSIS — E291 Testicular hypofunction: Secondary | ICD-10-CM | POA: Diagnosis not present

## 2018-08-17 DIAGNOSIS — F419 Anxiety disorder, unspecified: Secondary | ICD-10-CM | POA: Diagnosis not present

## 2018-08-17 DIAGNOSIS — Z23 Encounter for immunization: Secondary | ICD-10-CM | POA: Diagnosis not present

## 2018-08-17 MED FILL — GABAPENTIN 600 MG TABLET: 600 | 90 days supply | Qty: 180 | Fill #0

## 2018-08-22 DIAGNOSIS — L03116 Cellulitis of left lower limb: Secondary | ICD-10-CM | POA: Diagnosis not present

## 2018-08-22 MED FILL — TRIAMCINOLONE 0.1% CREAM: 0.1 | 14 days supply | Qty: 454 | Fill #0

## 2018-08-22 MED FILL — AMOX-CLAV 875-125 MG TABLET: 875-125 | 10 days supply | Qty: 20 | Fill #0

## 2018-08-28 DIAGNOSIS — L02416 Cutaneous abscess of left lower limb: Secondary | ICD-10-CM | POA: Diagnosis not present

## 2018-08-30 DIAGNOSIS — L03116 Cellulitis of left lower limb: Secondary | ICD-10-CM | POA: Diagnosis not present

## 2018-08-30 MED FILL — AMOX-CLAV 875-125 MG TABLET: 875-125 | 7 days supply | Qty: 14 | Fill #0

## 2018-09-03 DIAGNOSIS — L03119 Cellulitis of unspecified part of limb: Secondary | ICD-10-CM | POA: Diagnosis not present

## 2018-09-03 DIAGNOSIS — L02419 Cutaneous abscess of limb, unspecified: Secondary | ICD-10-CM | POA: Diagnosis not present

## 2018-09-05 DIAGNOSIS — E291 Testicular hypofunction: Secondary | ICD-10-CM | POA: Diagnosis not present

## 2018-09-05 DIAGNOSIS — L03116 Cellulitis of left lower limb: Secondary | ICD-10-CM | POA: Diagnosis not present

## 2018-09-05 MED FILL — BUPRENORPHINE HCL-NALOXONE: 8-2 | 30 days supply | Qty: 60 | Fill #0

## 2018-09-06 DIAGNOSIS — L02416 Cutaneous abscess of left lower limb: Secondary | ICD-10-CM | POA: Diagnosis not present

## 2018-09-06 MED FILL — SULFAMETHOXAZOLE-TMP DS TAB: 800-160 | 7 days supply | Qty: 14 | Fill #0

## 2018-09-12 MED FILL — VIIBRYD 40 MG TABLET: 40 | 90 days supply | Qty: 90 | Fill #0

## 2018-09-12 MED FILL — ALPRAZolam 1 MG TABS: 1 | 30 days supply | Qty: 60 | Fill #2

## 2018-09-18 DIAGNOSIS — L02416 Cutaneous abscess of left lower limb: Secondary | ICD-10-CM | POA: Diagnosis not present

## 2018-10-01 DIAGNOSIS — L02416 Cutaneous abscess of left lower limb: Secondary | ICD-10-CM | POA: Diagnosis not present

## 2018-10-03 DIAGNOSIS — E668 Other obesity: Secondary | ICD-10-CM | POA: Diagnosis not present

## 2018-10-03 DIAGNOSIS — Z79899 Other long term (current) drug therapy: Secondary | ICD-10-CM | POA: Diagnosis not present

## 2018-10-03 DIAGNOSIS — M1612 Unilateral primary osteoarthritis, left hip: Secondary | ICD-10-CM | POA: Diagnosis not present

## 2018-10-03 DIAGNOSIS — Z96641 Presence of right artificial hip joint: Secondary | ICD-10-CM | POA: Diagnosis not present

## 2018-10-05 MED FILL — BUPRENORPHINE HCL-NALOXONE: 8-2 | 30 days supply | Qty: 60 | Fill #0

## 2018-10-10 MED FILL — ALPRAZolam 1 MG TABS: 1 | 30 days supply | Qty: 60 | Fill #0

## 2018-10-16 DIAGNOSIS — L02416 Cutaneous abscess of left lower limb: Secondary | ICD-10-CM | POA: Diagnosis not present

## 2018-10-22 DIAGNOSIS — E291 Testicular hypofunction: Secondary | ICD-10-CM | POA: Diagnosis not present

## 2018-10-22 MED FILL — metFORMIN HCL ER 500 MG TB2: 500 | 90 days supply | Qty: 360 | Fill #0

## 2018-10-22 MED FILL — XULTOPHY 100 UNIT-3.6MG/ML: 100-3.6 | 30 days supply | Qty: 15 | Fill #0

## 2018-10-29 DIAGNOSIS — L02416 Cutaneous abscess of left lower limb: Secondary | ICD-10-CM | POA: Diagnosis not present

## 2018-11-02 MED FILL — BUPRENORPHINE HCL-NALOXONE: 8-2 | 30 days supply | Qty: 60 | Fill #0

## 2018-11-08 MED FILL — GABAPENTIN 600 MG TABLET: 600 | 90 days supply | Qty: 180 | Fill #0

## 2018-11-09 MED FILL — ALPRAZolam 1 MG TABS: 1 | 30 days supply | Qty: 60 | Fill #1

## 2018-11-13 DIAGNOSIS — E1165 Type 2 diabetes mellitus with hyperglycemia: Secondary | ICD-10-CM | POA: Diagnosis not present

## 2018-11-13 DIAGNOSIS — Z794 Long term (current) use of insulin: Secondary | ICD-10-CM | POA: Diagnosis not present

## 2018-11-13 DIAGNOSIS — E782 Mixed hyperlipidemia: Secondary | ICD-10-CM | POA: Diagnosis not present

## 2018-11-13 DIAGNOSIS — F419 Anxiety disorder, unspecified: Secondary | ICD-10-CM | POA: Diagnosis not present

## 2018-11-13 DIAGNOSIS — M159 Polyosteoarthritis, unspecified: Secondary | ICD-10-CM | POA: Diagnosis not present

## 2018-11-13 DIAGNOSIS — I10 Essential (primary) hypertension: Secondary | ICD-10-CM | POA: Diagnosis not present

## 2018-11-14 MED FILL — METOPROLOL SUCCINATE ER 100: 100 | 90 days supply | Qty: 90 | Fill #1

## 2018-11-21 DIAGNOSIS — E291 Testicular hypofunction: Secondary | ICD-10-CM | POA: Diagnosis not present

## 2018-11-23 MED FILL — ATORVASTATIN 40 MG TABLET: 40 | 90 days supply | Qty: 90 | Fill #0

## 2018-11-23 MED FILL — buPROPion HCL ER (XL) 300 M: 300 | 90 days supply | Qty: 90 | Fill #0

## 2018-12-01 DIAGNOSIS — M1612 Unilateral primary osteoarthritis, left hip: Secondary | ICD-10-CM | POA: Diagnosis not present

## 2018-12-01 DIAGNOSIS — Z96641 Presence of right artificial hip joint: Secondary | ICD-10-CM | POA: Diagnosis not present

## 2018-12-01 DIAGNOSIS — Z6832 Body mass index (BMI) 32.0-32.9, adult: Secondary | ICD-10-CM | POA: Diagnosis not present

## 2018-12-01 DIAGNOSIS — Z79899 Other long term (current) drug therapy: Secondary | ICD-10-CM | POA: Diagnosis not present

## 2018-12-03 MED FILL — BUPRENORPHINE HCL-NALOXONE: 8-2 | 30 days supply | Qty: 60 | Fill #0

## 2018-12-07 MED FILL — ALPRAZolam 1 MG TABS: 1 | 30 days supply | Qty: 60 | Fill #2

## 2018-12-13 MED FILL — VIIBRYD 40 MG TABLET: 40 | 90 days supply | Qty: 90 | Fill #1

## 2018-12-24 DIAGNOSIS — E291 Testicular hypofunction: Secondary | ICD-10-CM | POA: Diagnosis not present

## 2019-01-01 MED FILL — BUPRENORPHINE HCL-NALOXONE: 8-2 | 30 days supply | Qty: 60 | Fill #1

## 2019-01-07 MED FILL — ALPRAZolam 1 MG TABS: 1 | 30 days supply | Qty: 60 | Fill #0

## 2019-01-24 DIAGNOSIS — E291 Testicular hypofunction: Secondary | ICD-10-CM | POA: Diagnosis not present

## 2019-01-25 MED FILL — metFORMIN HCL ER 500 MG TB2: 500 | 90 days supply | Qty: 360 | Fill #0

## 2019-01-31 MED FILL — BUPRENORPHINE HCL-NALOXONE: 8-2 | 30 days supply | Qty: 60 | Fill #2

## 2019-02-06 MED FILL — ALPRAZolam 1 MG TABS: 1 | 30 days supply | Qty: 60 | Fill #0

## 2019-02-13 DIAGNOSIS — I10 Essential (primary) hypertension: Secondary | ICD-10-CM | POA: Diagnosis not present

## 2019-02-13 DIAGNOSIS — Z7984 Long term (current) use of oral hypoglycemic drugs: Secondary | ICD-10-CM | POA: Diagnosis not present

## 2019-02-13 DIAGNOSIS — F419 Anxiety disorder, unspecified: Secondary | ICD-10-CM | POA: Diagnosis not present

## 2019-02-13 DIAGNOSIS — E119 Type 2 diabetes mellitus without complications: Secondary | ICD-10-CM | POA: Diagnosis not present

## 2019-02-13 DIAGNOSIS — M549 Dorsalgia, unspecified: Secondary | ICD-10-CM | POA: Diagnosis not present

## 2019-02-13 DIAGNOSIS — F324 Major depressive disorder, single episode, in partial remission: Secondary | ICD-10-CM | POA: Diagnosis not present

## 2019-02-18 MED FILL — METOPROLOL SUCCINATE ER 100: 100 | 90 days supply | Qty: 90 | Fill #0

## 2019-02-22 MED FILL — buPROPion HCL ER (XL) 300 M: 300 | 90 days supply | Qty: 90 | Fill #0

## 2019-03-04 DIAGNOSIS — Z79899 Other long term (current) drug therapy: Secondary | ICD-10-CM | POA: Diagnosis not present

## 2019-03-04 DIAGNOSIS — Z6831 Body mass index (BMI) 31.0-31.9, adult: Secondary | ICD-10-CM | POA: Diagnosis not present

## 2019-03-04 DIAGNOSIS — F1120 Opioid dependence, uncomplicated: Secondary | ICD-10-CM | POA: Diagnosis not present

## 2019-03-04 MED FILL — BUPRENORPHINE HCL-NALOXONE: 8-2 | 30 days supply | Qty: 60 | Fill #0 | Status: TO

## 2019-03-05 MED FILL — GABAPENTIN 600 MG TABLET: 600 | 90 days supply | Qty: 180 | Fill #0

## 2019-03-06 MED FILL — ALPRAZolam 1 MG TABS: 1 | 30 days supply | Qty: 60 | Fill #0

## 2019-03-13 MED FILL — ATORVASTATIN 40 MG TABLET: 40 | 90 days supply | Qty: 90 | Fill #0

## 2019-03-13 MED FILL — VIIBRYD 40 MG TABLET: 40 | 90 days supply | Qty: 90 | Fill #0

## 2019-04-01 MED FILL — BUPRENORPHINE HCL-NALOXONE: 8-2 | 30 days supply | Qty: 60 | Fill #0

## 2019-04-03 MED FILL — ALPRAZolam 1 MG TABS: 1 | 30 days supply | Qty: 60 | Fill #0

## 2019-04-05 DIAGNOSIS — E291 Testicular hypofunction: Secondary | ICD-10-CM | POA: Diagnosis not present

## 2019-04-30 DIAGNOSIS — M5442 Lumbago with sciatica, left side: Secondary | ICD-10-CM | POA: Diagnosis not present

## 2019-04-30 DIAGNOSIS — Z79899 Other long term (current) drug therapy: Secondary | ICD-10-CM | POA: Diagnosis not present

## 2019-04-30 DIAGNOSIS — M545 Low back pain: Secondary | ICD-10-CM | POA: Diagnosis not present

## 2019-04-30 DIAGNOSIS — M5441 Lumbago with sciatica, right side: Secondary | ICD-10-CM | POA: Diagnosis not present

## 2019-04-30 DIAGNOSIS — G8929 Other chronic pain: Secondary | ICD-10-CM | POA: Diagnosis not present

## 2019-04-30 MED FILL — metFORMIN HCL ER 500 MG TB2: 500 | 90 days supply | Qty: 360 | Fill #0

## 2019-05-02 MED FILL — ALPRAZolam 1 MG TABS: 1 | 30 days supply | Qty: 60 | Fill #0

## 2019-05-02 MED FILL — BUPRENORPHINE HCL-NALOXONE: 8-2 | 30 days supply | Qty: 60 | Fill #0

## 2019-05-09 MED FILL — predniSONE 10 MG (21) TBPK: 10 | 6 days supply | Qty: 21 | Fill #0

## 2019-05-22 DIAGNOSIS — I10 Essential (primary) hypertension: Secondary | ICD-10-CM | POA: Diagnosis not present

## 2019-05-22 DIAGNOSIS — F419 Anxiety disorder, unspecified: Secondary | ICD-10-CM | POA: Diagnosis not present

## 2019-05-22 DIAGNOSIS — E291 Testicular hypofunction: Secondary | ICD-10-CM | POA: Diagnosis not present

## 2019-05-22 DIAGNOSIS — F324 Major depressive disorder, single episode, in partial remission: Secondary | ICD-10-CM | POA: Diagnosis not present

## 2019-05-22 DIAGNOSIS — Z7984 Long term (current) use of oral hypoglycemic drugs: Secondary | ICD-10-CM | POA: Diagnosis not present

## 2019-05-22 DIAGNOSIS — E782 Mixed hyperlipidemia: Secondary | ICD-10-CM | POA: Diagnosis not present

## 2019-05-22 DIAGNOSIS — E1165 Type 2 diabetes mellitus with hyperglycemia: Secondary | ICD-10-CM | POA: Diagnosis not present

## 2019-05-22 DIAGNOSIS — M549 Dorsalgia, unspecified: Secondary | ICD-10-CM | POA: Diagnosis not present

## 2019-05-24 MED FILL — METOPROLOL SUCCINATE ER 100: 100 | 90 days supply | Qty: 90 | Fill #1

## 2019-05-24 MED FILL — buPROPion HCL ER (XL) 300 M: 300 | 90 days supply | Qty: 90 | Fill #1

## 2019-05-28 MED FILL — UNIFINE PENTIPS 32GX5/32: 32G X 4 MM | 90 days supply | Qty: 100 | Fill #0

## 2019-05-28 MED FILL — UNIFINE PENTIPS 32GX5/32": 32G X 4 MM | 90 days supply | Qty: 100 | Fill #0

## 2019-05-28 MED FILL — TOUJEO SOLOSTAR 300 UNITS/M: 300 | 37 days supply | Qty: 5 | Fill #0

## 2019-05-29 DIAGNOSIS — M545 Low back pain: Secondary | ICD-10-CM | POA: Diagnosis not present

## 2019-05-31 MED FILL — BUPRENORPHINE HCL-NALOXONE: 8-2 | 30 days supply | Qty: 60 | Fill #0

## 2019-06-04 MED FILL — GABAPENTIN 600 MG TABLET: 600 | 90 days supply | Qty: 180 | Fill #0

## 2019-06-05 MED FILL — ALPRAZolam 1 MG TABS: 1 | 30 days supply | Qty: 60 | Fill #0

## 2019-06-06 DIAGNOSIS — M545 Low back pain: Secondary | ICD-10-CM | POA: Diagnosis not present

## 2019-06-10 MED FILL — VIIBRYD 40 MG TABLET: 40 | 90 days supply | Qty: 90 | Fill #0

## 2019-06-17 MED FILL — ATORVASTATIN 40 MG TABLET: 40 | 90 days supply | Qty: 90 | Fill #0

## 2019-06-19 DIAGNOSIS — M545 Low back pain: Secondary | ICD-10-CM | POA: Diagnosis not present

## 2019-06-28 DIAGNOSIS — E291 Testicular hypofunction: Secondary | ICD-10-CM | POA: Diagnosis not present

## 2019-07-01 DIAGNOSIS — G8929 Other chronic pain: Secondary | ICD-10-CM | POA: Diagnosis not present

## 2019-07-01 DIAGNOSIS — E785 Hyperlipidemia, unspecified: Secondary | ICD-10-CM | POA: Diagnosis not present

## 2019-07-01 DIAGNOSIS — M545 Low back pain: Secondary | ICD-10-CM | POA: Diagnosis not present

## 2019-07-01 DIAGNOSIS — E1169 Type 2 diabetes mellitus with other specified complication: Secondary | ICD-10-CM | POA: Diagnosis not present

## 2019-07-01 DIAGNOSIS — Z1159 Encounter for screening for other viral diseases: Secondary | ICD-10-CM | POA: Diagnosis not present

## 2019-07-01 DIAGNOSIS — Z79899 Other long term (current) drug therapy: Secondary | ICD-10-CM | POA: Diagnosis not present

## 2019-07-01 MED FILL — BUPRENORPHINE HCL-NALOXONE: 8-2 | 30 days supply | Qty: 60 | Fill #0

## 2019-07-01 MED FILL — tiZANidine HCL 4 MG TABS: 4 | 30 days supply | Qty: 90 | Fill #0

## 2019-07-03 MED FILL — ALPRAZolam 1 MG TABS: 1 | 30 days supply | Qty: 60 | Fill #1

## 2019-07-04 DIAGNOSIS — M545 Low back pain: Secondary | ICD-10-CM | POA: Diagnosis not present

## 2019-07-12 MED FILL — TOUJEO SOLOSTAR 300 UNITS/M: 300 | 37 days supply | Qty: 5 | Fill #1

## 2019-07-24 DIAGNOSIS — M545 Low back pain: Secondary | ICD-10-CM | POA: Diagnosis not present

## 2019-07-29 DIAGNOSIS — E291 Testicular hypofunction: Secondary | ICD-10-CM | POA: Diagnosis not present

## 2019-07-30 MED FILL — metFORMIN HCL ER 500 MG TB2: 500 | 90 days supply | Qty: 360 | Fill #0

## 2019-07-30 MED FILL — BUPRENORPHINE HCL-NALOXONE: 8-2 | 30 days supply | Qty: 60 | Fill #1

## 2019-07-31 MED FILL — ALPRAZolam 1 MG TABS: 1 | 30 days supply | Qty: 60 | Fill #2

## 2019-08-02 DIAGNOSIS — M545 Low back pain: Secondary | ICD-10-CM | POA: Diagnosis not present

## 2019-08-22 DIAGNOSIS — M545 Low back pain: Secondary | ICD-10-CM | POA: Diagnosis not present

## 2019-08-22 MED FILL — buPROPion HCL ER (XL) 300 M: 300 | 90 days supply | Qty: 90 | Fill #0

## 2019-08-22 MED FILL — METOPROLOL SUCCINATE ER 100: 100 | 30 days supply | Qty: 30 | Fill #0

## 2019-08-23 MED FILL — ALPRAZolam 1 MG TABS: 1 | 30 days supply | Qty: 75 | Fill #0

## 2019-08-28 DIAGNOSIS — M545 Low back pain: Secondary | ICD-10-CM | POA: Diagnosis not present

## 2019-08-29 DIAGNOSIS — Z79891 Long term (current) use of opiate analgesic: Secondary | ICD-10-CM | POA: Diagnosis not present

## 2019-08-29 DIAGNOSIS — F1120 Opioid dependence, uncomplicated: Secondary | ICD-10-CM | POA: Diagnosis not present

## 2019-08-29 DIAGNOSIS — M5441 Lumbago with sciatica, right side: Secondary | ICD-10-CM | POA: Diagnosis not present

## 2019-08-29 DIAGNOSIS — Z79899 Other long term (current) drug therapy: Secondary | ICD-10-CM | POA: Diagnosis not present

## 2019-08-29 MED FILL — TOUJEO SOLOSTAR 300 UNITS/M: 300 | 37 days supply | Qty: 5 | Fill #2

## 2019-08-30 MED FILL — BUPRENORPHINE HCL-NALOXONE: 8-2 | 30 days supply | Qty: 60 | Fill #0

## 2019-09-02 MED FILL — GABAPENTIN 600 MG TABLET: 600 | 90 days supply | Qty: 360 | Fill #0

## 2019-09-03 DIAGNOSIS — Z23 Encounter for immunization: Secondary | ICD-10-CM | POA: Diagnosis not present

## 2019-09-03 DIAGNOSIS — E119 Type 2 diabetes mellitus without complications: Secondary | ICD-10-CM | POA: Diagnosis not present

## 2019-09-03 DIAGNOSIS — G609 Hereditary and idiopathic neuropathy, unspecified: Secondary | ICD-10-CM | POA: Diagnosis not present

## 2019-09-03 DIAGNOSIS — F419 Anxiety disorder, unspecified: Secondary | ICD-10-CM | POA: Diagnosis not present

## 2019-09-03 DIAGNOSIS — Z794 Long term (current) use of insulin: Secondary | ICD-10-CM | POA: Diagnosis not present

## 2019-09-03 DIAGNOSIS — I10 Essential (primary) hypertension: Secondary | ICD-10-CM | POA: Diagnosis not present

## 2019-09-03 DIAGNOSIS — E782 Mixed hyperlipidemia: Secondary | ICD-10-CM | POA: Diagnosis not present

## 2019-09-03 DIAGNOSIS — E11628 Type 2 diabetes mellitus with other skin complications: Secondary | ICD-10-CM | POA: Diagnosis not present

## 2019-09-03 DIAGNOSIS — F324 Major depressive disorder, single episode, in partial remission: Secondary | ICD-10-CM | POA: Diagnosis not present

## 2019-09-10 MED FILL — VIIBRYD 40 MG TABLET: 40 | 90 days supply | Qty: 90 | Fill #0

## 2019-09-23 DIAGNOSIS — M545 Low back pain: Secondary | ICD-10-CM | POA: Diagnosis not present

## 2019-09-25 DIAGNOSIS — E291 Testicular hypofunction: Secondary | ICD-10-CM | POA: Diagnosis not present

## 2019-09-26 DIAGNOSIS — B349 Viral infection, unspecified: Secondary | ICD-10-CM | POA: Diagnosis not present

## 2019-09-26 MED FILL — ALPRAZolam 1 MG TABS: 1 | 30 days supply | Qty: 75 | Fill #1

## 2019-09-26 MED FILL — ATORVASTATIN 40 MG TABLET: 40 | 90 days supply | Qty: 90 | Fill #1

## 2019-09-27 DIAGNOSIS — E785 Hyperlipidemia, unspecified: Secondary | ICD-10-CM | POA: Diagnosis not present

## 2019-09-27 DIAGNOSIS — Z79899 Other long term (current) drug therapy: Secondary | ICD-10-CM | POA: Diagnosis not present

## 2019-09-27 DIAGNOSIS — Z7189 Other specified counseling: Secondary | ICD-10-CM | POA: Diagnosis not present

## 2019-09-27 DIAGNOSIS — G8929 Other chronic pain: Secondary | ICD-10-CM | POA: Diagnosis not present

## 2019-09-27 DIAGNOSIS — B349 Viral infection, unspecified: Secondary | ICD-10-CM | POA: Diagnosis not present

## 2019-09-27 DIAGNOSIS — M5442 Lumbago with sciatica, left side: Secondary | ICD-10-CM | POA: Diagnosis not present

## 2019-09-27 DIAGNOSIS — M5441 Lumbago with sciatica, right side: Secondary | ICD-10-CM | POA: Diagnosis not present

## 2019-09-27 DIAGNOSIS — E1169 Type 2 diabetes mellitus with other specified complication: Secondary | ICD-10-CM | POA: Diagnosis not present

## 2019-09-27 MED FILL — FARXIGA 10 MG TABLET: 10 | 30 days supply | Qty: 30 | Fill #0

## 2019-09-27 MED FILL — BUPRENORPHINE HCL-NALOXONE: 8-2 | 30 days supply | Qty: 60 | Fill #0

## 2019-09-27 MED FILL — METOPROLOL SUCCINATE ER 100: 100 | 90 days supply | Qty: 90 | Fill #0

## 2019-10-16 DIAGNOSIS — E11621 Type 2 diabetes mellitus with foot ulcer: Secondary | ICD-10-CM | POA: Diagnosis not present

## 2019-10-16 DIAGNOSIS — F419 Anxiety disorder, unspecified: Secondary | ICD-10-CM | POA: Diagnosis not present

## 2019-10-16 DIAGNOSIS — L97521 Non-pressure chronic ulcer of other part of left foot limited to breakdown of skin: Secondary | ICD-10-CM | POA: Diagnosis not present

## 2019-10-16 MED FILL — SSD 1% CREAM: 1 | 26 days supply | Qty: 400 | Fill #0

## 2019-10-24 DIAGNOSIS — Z681 Body mass index (BMI) 19 or less, adult: Secondary | ICD-10-CM | POA: Diagnosis not present

## 2019-10-24 DIAGNOSIS — F1120 Opioid dependence, uncomplicated: Secondary | ICD-10-CM | POA: Diagnosis not present

## 2019-10-24 DIAGNOSIS — E1169 Type 2 diabetes mellitus with other specified complication: Secondary | ICD-10-CM | POA: Diagnosis not present

## 2019-10-24 DIAGNOSIS — Z7189 Other specified counseling: Secondary | ICD-10-CM | POA: Diagnosis not present

## 2019-10-24 DIAGNOSIS — Z79899 Other long term (current) drug therapy: Secondary | ICD-10-CM | POA: Diagnosis not present

## 2019-10-24 DIAGNOSIS — E785 Hyperlipidemia, unspecified: Secondary | ICD-10-CM | POA: Diagnosis not present

## 2019-10-24 MED FILL — ALPRAZolam 1 MG TABS: 1 | 30 days supply | Qty: 75 | Fill #2

## 2019-10-24 MED FILL — TOUJEO SOLOSTAR 300 UNITS/M: 300 | 37 days supply | Qty: 5 | Fill #3

## 2019-10-25 DIAGNOSIS — E291 Testicular hypofunction: Secondary | ICD-10-CM | POA: Diagnosis not present

## 2019-10-25 MED FILL — BUPRENORPHINE HCL-NALOXONE: 8-2 | 30 days supply | Qty: 60 | Fill #0

## 2019-11-01 MED FILL — METFORMIN HCL ER 500 MG TB2: 500 | 90 days supply | Qty: 360 | Fill #0

## 2019-11-08 ENCOUNTER — Other Ambulatory Visit: Payer: Self-pay

## 2019-11-08 DIAGNOSIS — J069 Acute upper respiratory infection, unspecified: Secondary | ICD-10-CM | POA: Diagnosis not present

## 2019-11-08 DIAGNOSIS — Z20822 Contact with and (suspected) exposure to covid-19: Secondary | ICD-10-CM

## 2019-11-11 LAB — NOVEL CORONAVIRUS, NAA: SARS-CoV-2, NAA: NOT DETECTED

## 2019-11-18 MED FILL — BUPROPION HCL XL 300 MG TAB: 300 | 90 days supply | Qty: 90 | Fill #0

## 2019-11-22 DIAGNOSIS — Z79899 Other long term (current) drug therapy: Secondary | ICD-10-CM | POA: Diagnosis not present

## 2019-11-22 DIAGNOSIS — E1169 Type 2 diabetes mellitus with other specified complication: Secondary | ICD-10-CM | POA: Diagnosis not present

## 2019-11-22 DIAGNOSIS — F1120 Opioid dependence, uncomplicated: Secondary | ICD-10-CM | POA: Diagnosis not present

## 2019-11-22 DIAGNOSIS — E785 Hyperlipidemia, unspecified: Secondary | ICD-10-CM | POA: Diagnosis not present

## 2019-11-22 MED FILL — BUPRENORPHINE HCL-NALOXONE: 8-2 | 30 days supply | Qty: 60 | Fill #0

## 2019-11-26 MED FILL — ALPRAZolam 1 MG TABS: 1 | 30 days supply | Qty: 75 | Fill #3

## 2019-11-27 DIAGNOSIS — E1165 Type 2 diabetes mellitus with hyperglycemia: Secondary | ICD-10-CM | POA: Diagnosis not present

## 2019-11-27 DIAGNOSIS — E782 Mixed hyperlipidemia: Secondary | ICD-10-CM | POA: Diagnosis not present

## 2019-11-27 DIAGNOSIS — Z794 Long term (current) use of insulin: Secondary | ICD-10-CM | POA: Diagnosis not present

## 2019-11-27 DIAGNOSIS — E291 Testicular hypofunction: Secondary | ICD-10-CM | POA: Diagnosis not present

## 2019-11-27 DIAGNOSIS — F419 Anxiety disorder, unspecified: Secondary | ICD-10-CM | POA: Diagnosis not present

## 2019-11-27 DIAGNOSIS — N529 Male erectile dysfunction, unspecified: Secondary | ICD-10-CM | POA: Diagnosis not present

## 2019-11-27 DIAGNOSIS — M25561 Pain in right knee: Secondary | ICD-10-CM | POA: Diagnosis not present

## 2019-11-27 DIAGNOSIS — F324 Major depressive disorder, single episode, in partial remission: Secondary | ICD-10-CM | POA: Diagnosis not present

## 2019-11-27 DIAGNOSIS — L97521 Non-pressure chronic ulcer of other part of left foot limited to breakdown of skin: Secondary | ICD-10-CM | POA: Diagnosis not present

## 2019-11-27 DIAGNOSIS — I10 Essential (primary) hypertension: Secondary | ICD-10-CM | POA: Diagnosis not present

## 2019-12-10 MED FILL — TOUJEO SOLOSTAR 300 UNITS/M: 300 | 37 days supply | Qty: 5 | Fill #4

## 2019-12-10 MED FILL — VIIBRYD 40 MG TABLET: 40 | 90 days supply | Qty: 90 | Fill #1

## 2019-12-19 DIAGNOSIS — F329 Major depressive disorder, single episode, unspecified: Secondary | ICD-10-CM | POA: Diagnosis not present

## 2019-12-19 DIAGNOSIS — G8929 Other chronic pain: Secondary | ICD-10-CM | POA: Diagnosis not present

## 2019-12-19 DIAGNOSIS — E1169 Type 2 diabetes mellitus with other specified complication: Secondary | ICD-10-CM | POA: Diagnosis not present

## 2019-12-19 DIAGNOSIS — M25561 Pain in right knee: Secondary | ICD-10-CM | POA: Diagnosis not present

## 2019-12-19 DIAGNOSIS — Z79899 Other long term (current) drug therapy: Secondary | ICD-10-CM | POA: Diagnosis not present

## 2019-12-19 DIAGNOSIS — F419 Anxiety disorder, unspecified: Secondary | ICD-10-CM | POA: Diagnosis not present

## 2019-12-19 DIAGNOSIS — E785 Hyperlipidemia, unspecified: Secondary | ICD-10-CM | POA: Diagnosis not present

## 2019-12-19 MED FILL — BUPRENORPHINE HCL-NALOXONE: 8-2 | 30 days supply | Qty: 60 | Fill #0

## 2019-12-23 ENCOUNTER — Ambulatory Visit (INDEPENDENT_AMBULATORY_CARE_PROVIDER_SITE_OTHER): Payer: 59 | Admitting: Orthopaedic Surgery

## 2019-12-23 ENCOUNTER — Encounter: Payer: Self-pay | Admitting: Orthopaedic Surgery

## 2019-12-23 ENCOUNTER — Other Ambulatory Visit: Payer: Self-pay

## 2019-12-23 ENCOUNTER — Ambulatory Visit (INDEPENDENT_AMBULATORY_CARE_PROVIDER_SITE_OTHER): Payer: 59

## 2019-12-23 DIAGNOSIS — M25561 Pain in right knee: Secondary | ICD-10-CM | POA: Diagnosis not present

## 2019-12-23 MED ORDER — LIDOCAINE HCL 1 % IJ SOLN
3.0000 mL | INTRAMUSCULAR | Status: AC | PRN
  Administered 2019-12-23: 3 mL

## 2019-12-23 MED ORDER — METHYLPREDNISOLONE ACETATE 40 MG/ML IJ SUSP
40.0000 mg | INTRAMUSCULAR | Status: AC | PRN
  Administered 2019-12-23: 40 mg via INTRA_ARTICULAR

## 2019-12-23 NOTE — Progress Notes (Signed)
Office Visit Note   Patient: Brandon Gordon           Date of Birth: 13-Jul-1971           MRN: [id number] Visit Date: 12/23/2019              Requested by: Johny Blamer, MD [phone] [address] [place],  [place] [address] PCP: Johny Blamer, MD   Assessment & Plan: Visit Diagnoses:  1. Acute pain of right knee     Plan: Given the acute nature of his right knee pain combined with a mild effusion I am recommending a steroid injection in this knee to hopefully treat the pain.  He agrees with this treatment plan and tolerated it well.  I would like to see him back in just 2 weeks to see how he is doing overall.  If he still having pain with pivoting activities and is clinical exam is not changed I would recommend a MRI of the right knee to rule out a meniscal tear.  We will see what he looks like though in 2 weeks after having the steroid injection.  All question concerns were answered and addressed.  Follow-Up Instructions: Return in about 2 weeks (around 01/06/2020).   Orders:  Orders Placed This Encounter  Procedures  . Large Joint Inj  . XR Knee 1-2 Views Right   No orders of the defined types were placed in this encounter.     Procedures: Large Joint Inj: R knee on 12/23/2019 3:23 PM Indications: diagnostic evaluation and pain Details: 22 G 1.5 in needle, superolateral approach  Arthrogram: No  Medications: 3 mL lidocaine 1 %; 40 mg methylPREDNISolone acetate 40 MG/ML Outcome: tolerated well, no immediate complications Procedure, treatment alternatives, risks and benefits explained, specific risks discussed. Consent was given by the patient. Immediately prior to procedure a time out was called to verify the correct patient, procedure, equipment, support staff and site/side marked as required. Patient was prepped and draped in the usual sterile fashion.       Clinical Data: No additional findings.   Subjective: Chief Complaint  Patient presents with  .  Right Knee - Pain  Aldous comes in today with acute right knee pain and swelling for about 2 weeks.  There is no known injury but he had a foam roller under his knee and knee then was very uncomfortable once again to roll out from his knee.  There was no pop but he did experience shooting pains around the knee especially in the posterior aspect of his knee.  Weightbearing has been painful as well as extension of the knee and flexion past 90 degrees.  Has had trouble with pivoting activities and trouble going up and down stairs with out causing his right knee to hurt.  He has never injured this knee before.  He does have a history of a right anterior total hip arthroplasty.  HPI  Review of Systems He currently denies any headache, chest pain, shortness of breath, fever, chills, nausea, vomiting  Objective: Vital Signs: There were no vitals taken for this visit.  Physical Exam He is alert and orient x3 and in no acute distress Ortho Exam Examination of his right knee does show a slight effusion.  He has pain with full extension and pain + degrees of flexion.  There is pain when pivoting the tibia on the femur and the medial compartment of the knee. Specialty Comments:  No specialty comments available.  Imaging: XR  Knee 1-2 Views Right  Result Date: 12/23/2019 2 views of the right knee show no acute findings.  The joint space is well-maintained.    PMFS History: There are no problems to display for this patient.  Past Medical History:  Diagnosis Date  . Anxiety   . At risk for sleep apnea    STOP-BANG= 5      SENT TO PCP 04-05-2016  . History of kidney stones   . Hyperlipidemia   . Hypertension   . Left ureteral stone   . OA (osteoarthritis)    back, hip  . Type 2 diabetes mellitus ([place])     History reviewed. No pertinent family history.  Past Surgical History:  Procedure Laterality Date  . TOTAL HIP ARTHROPLASTY Right 04-22-2011   Social History   Occupational History  .  Not on file  Tobacco Use  . Smoking status: Never Smoker  . Smokeless tobacco: Never Used  Substance and Sexual Activity  . Alcohol use: Yes    Alcohol/week: 0.0 standard drinks  . Drug use: No  . Sexual activity: Yes    Birth control/protection: None

## 2019-12-27 MED FILL — ATORVASTATIN 40 MG TABLET: 40 | 90 days supply | Qty: 90 | Fill #0

## 2019-12-27 MED FILL — METOPROLOL SUCCINATE ER 100: 100 | 90 days supply | Qty: 90 | Fill #0

## 2019-12-27 MED FILL — ALPRAZolam 1 MG TABS: 1 | 30 days supply | Qty: 120 | Fill #0

## 2020-01-06 ENCOUNTER — Ambulatory Visit: Payer: 59 | Admitting: Physician Assistant

## 2020-01-21 ENCOUNTER — Ambulatory Visit: Payer: 59 | Admitting: Orthopaedic Surgery

## 2020-01-22 DIAGNOSIS — Z6833 Body mass index (BMI) 33.0-33.9, adult: Secondary | ICD-10-CM | POA: Diagnosis not present

## 2020-01-22 DIAGNOSIS — Z20822 Contact with and (suspected) exposure to covid-19: Secondary | ICD-10-CM | POA: Diagnosis not present

## 2020-01-22 DIAGNOSIS — F1120 Opioid dependence, uncomplicated: Secondary | ICD-10-CM | POA: Diagnosis not present

## 2020-01-22 DIAGNOSIS — Z79891 Long term (current) use of opiate analgesic: Secondary | ICD-10-CM | POA: Diagnosis not present

## 2020-01-22 DIAGNOSIS — Z79899 Other long term (current) drug therapy: Secondary | ICD-10-CM | POA: Diagnosis not present

## 2020-01-22 MED FILL — BUPREN-NALOX FILM 8-2MG: 8-2 | 30 days supply | Qty: 60 | Fill #0

## 2020-01-31 DIAGNOSIS — E291 Testicular hypofunction: Secondary | ICD-10-CM | POA: Diagnosis not present

## 2020-01-31 MED FILL — METFORMIN HCL ER 500 MG TB2: 500 | 90 days supply | Qty: 360 | Fill #0

## 2020-02-10 MED FILL — TOUJEO SOLOSTAR 300 UNITS/M: 300 | 37 days supply | Qty: 5 | Fill #5

## 2020-02-11 MED FILL — ALPRAZolam 1 MG TABS: 1 | 30 days supply | Qty: 120 | Fill #1

## 2020-02-17 MED FILL — BUPROPION HCL ER (XL) 300 M: 300 | 90 days supply | Qty: 90 | Fill #0

## 2020-02-21 DIAGNOSIS — Z79899 Other long term (current) drug therapy: Secondary | ICD-10-CM | POA: Diagnosis not present

## 2020-02-21 DIAGNOSIS — Z79891 Long term (current) use of opiate analgesic: Secondary | ICD-10-CM | POA: Diagnosis not present

## 2020-02-21 DIAGNOSIS — Z6833 Body mass index (BMI) 33.0-33.9, adult: Secondary | ICD-10-CM | POA: Diagnosis not present

## 2020-02-21 DIAGNOSIS — F1120 Opioid dependence, uncomplicated: Secondary | ICD-10-CM | POA: Diagnosis not present

## 2020-02-21 MED FILL — BUPREN-NALOX FILM 8-2MG: 8-2 | 30 days supply | Qty: 60 | Fill #0

## 2020-02-25 DIAGNOSIS — E1165 Type 2 diabetes mellitus with hyperglycemia: Secondary | ICD-10-CM | POA: Diagnosis not present

## 2020-02-25 DIAGNOSIS — G609 Hereditary and idiopathic neuropathy, unspecified: Secondary | ICD-10-CM | POA: Diagnosis not present

## 2020-02-25 DIAGNOSIS — E291 Testicular hypofunction: Secondary | ICD-10-CM | POA: Diagnosis not present

## 2020-02-25 DIAGNOSIS — E782 Mixed hyperlipidemia: Secondary | ICD-10-CM | POA: Diagnosis not present

## 2020-02-25 DIAGNOSIS — F419 Anxiety disorder, unspecified: Secondary | ICD-10-CM | POA: Diagnosis not present

## 2020-02-25 DIAGNOSIS — I10 Essential (primary) hypertension: Secondary | ICD-10-CM | POA: Diagnosis not present

## 2020-03-03 DIAGNOSIS — E291 Testicular hypofunction: Secondary | ICD-10-CM | POA: Diagnosis not present

## 2020-03-03 DIAGNOSIS — M79661 Pain in right lower leg: Secondary | ICD-10-CM | POA: Diagnosis not present

## 2020-03-09 MED FILL — VIIBRYD 40 MG TABLET: 40 | 90 days supply | Qty: 90 | Fill #0

## 2020-03-19 MED FILL — TOUJEO SOLOSTAR 300 UNITS/M: 300 | 37 days supply | Qty: 5 | Fill #6

## 2020-03-20 MED FILL — BUPREN-NALOX FILM 8-2MG: 8-2 | 14 days supply | Qty: 28 | Fill #0

## 2020-03-26 MED FILL — ALPRAZolam 1 MG TABS: 1 | 30 days supply | Qty: 120 | Fill #2

## 2020-03-26 MED FILL — ATORVASTATIN 40 MG TABLET: 40 | 90 days supply | Qty: 90 | Fill #0

## 2020-04-02 DIAGNOSIS — Z20822 Contact with and (suspected) exposure to covid-19: Secondary | ICD-10-CM | POA: Diagnosis not present

## 2020-04-02 DIAGNOSIS — F1120 Opioid dependence, uncomplicated: Secondary | ICD-10-CM | POA: Diagnosis not present

## 2020-04-02 DIAGNOSIS — E1169 Type 2 diabetes mellitus with other specified complication: Secondary | ICD-10-CM | POA: Diagnosis not present

## 2020-04-02 DIAGNOSIS — Z79891 Long term (current) use of opiate analgesic: Secondary | ICD-10-CM | POA: Diagnosis not present

## 2020-04-02 DIAGNOSIS — Z79899 Other long term (current) drug therapy: Secondary | ICD-10-CM | POA: Diagnosis not present

## 2020-04-02 MED FILL — BUPREN-NALOX FILM 8-2MG: 8-2 | 30 days supply | Qty: 60 | Fill #0

## 2020-04-03 ENCOUNTER — Encounter (HOSPITAL_BASED_OUTPATIENT_CLINIC_OR_DEPARTMENT_OTHER): Payer: Self-pay | Admitting: Emergency Medicine

## 2020-04-03 ENCOUNTER — Emergency Department (HOSPITAL_BASED_OUTPATIENT_CLINIC_OR_DEPARTMENT_OTHER)
Admission: EM | Admit: 2020-04-03 | Discharge: 2020-04-03 | Disposition: A | Discharge status: Home / Self Care | Payer: 59 | Attending: Emergency Medicine | Admitting: Emergency Medicine

## 2020-04-03 ENCOUNTER — Other Ambulatory Visit: Payer: Self-pay

## 2020-04-03 ENCOUNTER — Emergency Department (HOSPITAL_BASED_OUTPATIENT_CLINIC_OR_DEPARTMENT_OTHER): Payer: 59

## 2020-04-03 DIAGNOSIS — I10 Essential (primary) hypertension: Secondary | ICD-10-CM | POA: Diagnosis not present

## 2020-04-03 DIAGNOSIS — Z96641 Presence of right artificial hip joint: Secondary | ICD-10-CM | POA: Diagnosis not present

## 2020-04-03 DIAGNOSIS — E119 Type 2 diabetes mellitus without complications: Secondary | ICD-10-CM | POA: Diagnosis not present

## 2020-04-03 DIAGNOSIS — R079 Chest pain, unspecified: Secondary | ICD-10-CM | POA: Diagnosis not present

## 2020-04-03 DIAGNOSIS — Z794 Long term (current) use of insulin: Secondary | ICD-10-CM | POA: Insufficient documentation

## 2020-04-03 DIAGNOSIS — Z79899 Other long term (current) drug therapy: Secondary | ICD-10-CM | POA: Diagnosis not present

## 2020-04-03 HISTORY — DX: Chronic pain syndrome: G89.4

## 2020-04-03 HISTORY — DX: Pain, unspecified: R52

## 2020-04-03 LAB — TROPONIN I (HIGH SENSITIVITY)
Troponin I (High Sensitivity): 7 ng/L (ref <18)
Troponin I (High Sensitivity): 8 ng/L (ref <18)

## 2020-04-03 LAB — BASIC METABOLIC PANEL
Anion gap: 11 (ref 5–15)
BUN: 18 mg/dL (ref 6–20)
CO2: 29 mmol/L (ref 22–32)
Calcium: 9.2 mg/dL (ref 8.9–10.3)
Chloride: 100 mmol/L (ref 98–111)
Creatinine, Ser: 0.87 mg/dL (ref 0.61–1.24)
GFR calc Af Amer: >60 mL/min (ref >60)
GFR calc non Af Amer: >60 mL/min (ref >60)
Glucose, Bld: 239 mg/dL — ABNORMAL HIGH (ref 70–99)
Potassium: 4.4 mmol/L (ref 3.5–5.1)
Sodium: 140 mmol/L (ref 135–145)

## 2020-04-03 LAB — CBC
HCT: 42 % (ref 39.0–52.0)
Hemoglobin: 14.4 g/dL (ref 13.0–17.0)
MCH: 28.5 pg (ref 26.0–34.0)
MCHC: 34.3 g/dL (ref 30.0–36.0)
MCV: 83.2 fL (ref 80.0–100.0)
Platelets: 251 10*3/uL (ref 150–400)
RBC: 5.05 MIL/uL (ref 4.22–5.81)
RDW: 12.6 % (ref 11.5–15.5)
WBC: 7.2 10*3/uL (ref 4.0–10.5)
nRBC: 0 % (ref 0.0–0.2)

## 2020-04-03 NOTE — ED Triage Notes (Signed)
Pt states he has chest tightness and slight sob this am.  Pt said recent covid shot #2.  No cough.  Pt able to speak in sentences easily.

## 2020-04-03 NOTE — ED Notes (Signed)
ED Provider at bedside. 

## 2020-04-06 MED FILL — METOPROLOL SUCCINATE ER 100: 100 | 90 days supply | Qty: 90 | Fill #0

## 2020-04-08 NOTE — ED Provider Notes (Signed)
[facility] EMERGENCY DEPARTMENT Provider Note   CSN: [id number] Arrival date & time: 04/03/20  0957     History Chief Complaint  Patient presents with  . Chest Pain    Brandon Gordon is a 49 y.o. male.  HPI   49 year old male with chest tightness.  Onset this morning while at rest.  Persistent since then.  Very mild shortness of breath.  No cough.  No fevers or chills.  Denies any nausea, diaphoresis, palpitations.  No unusual leg pain or swelling.  He has not noticed anything that seems to make the pain better or worse.  Past Medical History:  Diagnosis Date  . Anxiety   . At risk for sleep apnea    STOP-BANG= 5      SENT TO PCP 04-05-2016  . Chronic pain syndrome   . History of kidney stones   . Hyperlipidemia   . Hypertension   . Left ureteral stone   . OA (osteoarthritis)    back, hip  . Pain management   . Type 2 diabetes mellitus (HCC)     There are no problems to display for this patient.   Past Surgical History:  Procedure Laterality Date  . TOTAL HIP ARTHROPLASTY Right 04-22-2011       History reviewed. No pertinent family history.  Social History   Tobacco Use  . Smoking status: Never Smoker  . Smokeless tobacco: Never Used  Substance Use Topics  . Alcohol use: Yes    Alcohol/week: 0.0 standard drinks  . Drug use: No    Home Medications Prior to Admission medications   Medication Sig Start Date End Date Taking? Authorizing Provider  Albiglutide 30 MG PEN Inject 30 mg into the skin once a week.     [provider]  ALPRAZolam Prudy Feeler) 1 MG tablet Take 1 mg by mouth at bedtime as needed.    [provider]  atorvastatin (LIPITOR) 40 MG tablet Take 40 mg by mouth every morning.     [provider]  buPROPion (WELLBUTRIN XL) 300 MG 24 hr tablet Take 300 mg by mouth every morning.     [provider]  gabapentin (NEURONTIN) 300 MG capsule TAKE 1 CAPSULE BY MOUTH DAILY FOR 2 DAYS,THEN 1 CAPSULE TWICE  DAILY FOR 2 DAYS THEN 1 CAPSULE THREE TIMES DAILY AS INDICATED 10/23/17   [provider]  metFORMIN (GLUCOPHAGE-XR) 500 MG 24 hr tablet Take 1,000 mg by mouth 2 (two) times daily. 01/31/20   [provider]  metoprolol succinate (TOPROL-XL) 100 MG 24 hr tablet Take 100 mg by mouth every morning.  03/18/16   [provider]  SUBOXONE 8-2 MG FILM Place 1 Film under the tongue 3 (three) times daily as needed (pain).  03/08/16   [provider]  TOUJEO SOLOSTAR 300 UNIT/ML Solostar Pen SMARTSIG:36 Unit(s) SUB-Q Daily 03/19/20   [provider]  Vilazodone HCl (VIIBRYD) 40 MG TABS Take 40 mg by mouth every morning.     [provider]    Allergies    Lexapro [escitalopram oxalate] and Victoza [liraglutide]  Review of Systems   Review of Systems All systems reviewed and negative, other than as noted in HPI.  Physical Exam Updated Vital Signs BP (!) 144/93   Pulse 65   Temp 98.7 F (37.1 C) (Oral)   Resp 10   Ht 6\' 6"  (1.981 m)   Wt 117.9 kg   SpO2 100%   BMI 30.05 kg/m   Physical  Exam Vitals and nursing note reviewed.  Constitutional:      General: He is not in acute distress.    Appearance: He is well-developed.  HENT:     Head: Normocephalic and atraumatic.  Eyes:     General:        Right eye: No discharge.        Left eye: No discharge.     Conjunctiva/sclera: Conjunctivae normal.  Cardiovascular:     Rate and Rhythm: Normal rate and regular rhythm.     Heart sounds: Normal heart sounds. No murmur. No friction rub. No gallop.   Pulmonary:     Effort: Pulmonary effort is normal. No respiratory distress.     Breath sounds: Normal breath sounds.  Abdominal:     General: There is no distension.     Palpations: Abdomen is soft.     Tenderness: There is no abdominal tenderness.  Musculoskeletal:        General: No tenderness.     Cervical back: Neck supple.     Comments: Lower extremities symmetric as compared to each  other. No calf tenderness. Negative Homan's. No palpable cords.   Skin:    General: Skin is warm and dry.  Neurological:     Mental Status: He is alert.  Psychiatric:        Behavior: Behavior normal.        Thought Content: Thought content normal.     ED Results / Procedures / Treatments   Labs (all labs ordered are listed, but only abnormal results are displayed) Labs Reviewed  BASIC METABOLIC PANEL - Abnormal; Notable for the following components:      Result Value   Glucose, Bld 239 (*)    All other components within normal limits  CBC  TROPONIN I (HIGH SENSITIVITY)  TROPONIN I (HIGH SENSITIVITY)    EKG EKG Interpretation  Date/Time:  Friday April 03 2020 10:03:27 EDT Ventricular Rate:  99 PR Interval:  204 QRS Duration: 72 QT Interval:  352 QTC Calculation: 451 R Axis:   16 Text Interpretation: Normal sinus rhythm Non-specific ST-t changes Confirmed by Raeford Razor [phone]) on 04/03/2020 11:12:29 AM   Radiology No results found.   DG Chest 2 View  Result Date: 04/03/2020 CLINICAL DATA:  Chest pain. EXAM: CHEST - 2 VIEW COMPARISON:  July 11, 2017.  Report of CT scan of July 20, 1999. FINDINGS: Normal cardiac size. Stable rounded right paratracheal prominence is noted compared to prior exam of 2018, reportedly representing calcified right paratracheal lymph nodes as described on previous CT report. No pneumothorax or pleural effusion is noted. Lungs are clear. Bony thorax is unremarkable. IMPRESSION: No active cardiopulmonary disease. Electronically Signed   By: Lupita Raider M.D.   On: 04/03/2020 11:01     Procedures Procedures (including critical care time)  Medications Ordered in ED Medications - No data to display  ED Course  I have reviewed the triage vital signs and the nursing notes.  Pertinent labs & imaging results that were available during my care of the patient were reviewed by me and considered in my medical decision making (see chart for  details).    MDM Rules/Calculators/A&P                      49yM with CP.  Seems typical for ACS.  Doubt PE, dissection or other emergent process.  Final Clinical Impression(s) / ED Diagnoses Final diagnoses:  Chest pain, unspecified type  Rx / DC Orders ED Discharge Orders    None       Virgel Manifold, MD 04/08/20 [phone]

## 2020-04-30 DIAGNOSIS — Z79891 Long term (current) use of opiate analgesic: Secondary | ICD-10-CM | POA: Diagnosis not present

## 2020-04-30 DIAGNOSIS — E1169 Type 2 diabetes mellitus with other specified complication: Secondary | ICD-10-CM | POA: Diagnosis not present

## 2020-04-30 DIAGNOSIS — F1120 Opioid dependence, uncomplicated: Secondary | ICD-10-CM | POA: Diagnosis not present

## 2020-04-30 DIAGNOSIS — Z79899 Other long term (current) drug therapy: Secondary | ICD-10-CM | POA: Diagnosis not present

## 2020-05-01 DIAGNOSIS — E291 Testicular hypofunction: Secondary | ICD-10-CM | POA: Diagnosis not present

## 2020-05-04 MED FILL — METFORMIN HCL ER 500 MG TB2: 500 | 90 days supply | Qty: 360 | Fill #0

## 2020-05-14 MED FILL — ALPRAZolam 1 MG TABS: 1 | 30 days supply | Qty: 120 | Fill #3

## 2020-05-15 MED FILL — BUPROPION HCL ER (XL) 300 M: 300 | 90 days supply | Qty: 90 | Fill #0

## 2020-05-29 DIAGNOSIS — Z79891 Long term (current) use of opiate analgesic: Secondary | ICD-10-CM | POA: Diagnosis not present

## 2020-05-29 DIAGNOSIS — F1120 Opioid dependence, uncomplicated: Secondary | ICD-10-CM | POA: Diagnosis not present

## 2020-05-29 DIAGNOSIS — Z79899 Other long term (current) drug therapy: Secondary | ICD-10-CM | POA: Diagnosis not present

## 2020-05-29 DIAGNOSIS — E1169 Type 2 diabetes mellitus with other specified complication: Secondary | ICD-10-CM | POA: Diagnosis not present

## 2020-06-01 MED FILL — BUPREN-NALOX FILM 8-2MG: 8-2 | 30 days supply | Qty: 60 | Fill #0

## 2020-06-05 MED FILL — VIIBRYD 40 MG TABLET: 40 | 90 days supply | Qty: 90 | Fill #0

## 2020-06-18 DIAGNOSIS — F419 Anxiety disorder, unspecified: Secondary | ICD-10-CM | POA: Diagnosis not present

## 2020-06-18 DIAGNOSIS — E291 Testicular hypofunction: Secondary | ICD-10-CM | POA: Diagnosis not present

## 2020-06-18 DIAGNOSIS — Z794 Long term (current) use of insulin: Secondary | ICD-10-CM | POA: Diagnosis not present

## 2020-06-18 DIAGNOSIS — I10 Essential (primary) hypertension: Secondary | ICD-10-CM | POA: Diagnosis not present

## 2020-06-18 DIAGNOSIS — G609 Hereditary and idiopathic neuropathy, unspecified: Secondary | ICD-10-CM | POA: Diagnosis not present

## 2020-06-18 DIAGNOSIS — E1165 Type 2 diabetes mellitus with hyperglycemia: Secondary | ICD-10-CM | POA: Diagnosis not present

## 2020-06-25 ENCOUNTER — Other Ambulatory Visit (HOSPITAL_COMMUNITY): Payer: Self-pay | Admitting: Family Medicine

## 2020-06-25 MED FILL — ATORVASTATIN 40 MG TABLET: 40 | 90 days supply | Qty: 90 | Fill #0

## 2020-07-01 DIAGNOSIS — Z79899 Other long term (current) drug therapy: Secondary | ICD-10-CM | POA: Diagnosis not present

## 2020-07-01 DIAGNOSIS — Z79891 Long term (current) use of opiate analgesic: Secondary | ICD-10-CM | POA: Diagnosis not present

## 2020-07-01 DIAGNOSIS — E1169 Type 2 diabetes mellitus with other specified complication: Secondary | ICD-10-CM | POA: Diagnosis not present

## 2020-07-01 DIAGNOSIS — F1120 Opioid dependence, uncomplicated: Secondary | ICD-10-CM | POA: Diagnosis not present

## 2020-07-01 MED FILL — BUPREN-NALOX FILM 8-2MG: 8-2 | 30 days supply | Qty: 60 | Fill #0

## 2020-07-10 ENCOUNTER — Other Ambulatory Visit (HOSPITAL_COMMUNITY): Payer: Self-pay | Admitting: Family Medicine

## 2020-07-10 MED FILL — ALPRAZOLAM 1 MG TABS: 1 | 30 days supply | Qty: 120 | Fill #0

## 2020-07-10 MED FILL — METOPROLOL SUCCINATE ER 100: 100 | 90 days supply | Qty: 90 | Fill #0

## 2020-07-30 DIAGNOSIS — Z79891 Long term (current) use of opiate analgesic: Secondary | ICD-10-CM | POA: Diagnosis not present

## 2020-07-30 DIAGNOSIS — Z79899 Other long term (current) drug therapy: Secondary | ICD-10-CM | POA: Diagnosis not present

## 2020-07-30 DIAGNOSIS — F1120 Opioid dependence, uncomplicated: Secondary | ICD-10-CM | POA: Diagnosis not present

## 2020-07-30 MED FILL — BUPRENORPHINE HCL-NALOXONE: 8-2 | 30 days supply | Qty: 60 | Fill #0

## 2020-08-05 ENCOUNTER — Other Ambulatory Visit (HOSPITAL_COMMUNITY): Payer: Self-pay | Admitting: Family Medicine

## 2020-08-05 MED FILL — METFORMIN HCL ER 500 MG TB2: 500 | 90 days supply | Qty: 360 | Fill #0

## 2020-08-05 MED FILL — BUPROPION HCL ER (XL) 300 M: 300 | 90 days supply | Qty: 90 | Fill #0

## 2020-08-19 DIAGNOSIS — N410 Acute prostatitis: Secondary | ICD-10-CM | POA: Diagnosis not present

## 2020-08-19 DIAGNOSIS — R350 Frequency of micturition: Secondary | ICD-10-CM | POA: Diagnosis not present

## 2020-08-19 MED FILL — CIPROFLOXACIN HCL 250 MG TA: 250 | 14 days supply | Qty: 28 | Fill #0

## 2020-08-27 DIAGNOSIS — Z79899 Other long term (current) drug therapy: Secondary | ICD-10-CM | POA: Diagnosis not present

## 2020-08-27 DIAGNOSIS — F1120 Opioid dependence, uncomplicated: Secondary | ICD-10-CM | POA: Diagnosis not present

## 2020-08-28 MED FILL — BUPRENORPHINE HCL-NALOXONE: 8-2 | 30 days supply | Qty: 60 | Fill #0

## 2020-09-05 ENCOUNTER — Other Ambulatory Visit (HOSPITAL_COMMUNITY): Payer: Self-pay | Admitting: Family Medicine

## 2020-09-05 MED FILL — ALPRAZolam 1 MG TABS: 1 | 30 days supply | Qty: 120 | Fill #0

## 2020-09-09 ENCOUNTER — Other Ambulatory Visit (HOSPITAL_COMMUNITY): Payer: Self-pay | Admitting: Family Medicine

## 2020-09-09 MED FILL — VIIBRYD 40 MG TABLET: 40 | 90 days supply | Qty: 90 | Fill #0

## 2020-09-29 ENCOUNTER — Other Ambulatory Visit (HOSPITAL_COMMUNITY): Payer: Self-pay | Admitting: Internal Medicine

## 2020-09-29 DIAGNOSIS — F1120 Opioid dependence, uncomplicated: Secondary | ICD-10-CM | POA: Diagnosis not present

## 2020-09-29 DIAGNOSIS — Z79899 Other long term (current) drug therapy: Secondary | ICD-10-CM | POA: Diagnosis not present

## 2020-09-29 DIAGNOSIS — Z79891 Long term (current) use of opiate analgesic: Secondary | ICD-10-CM | POA: Diagnosis not present

## 2020-09-29 DIAGNOSIS — Z23 Encounter for immunization: Secondary | ICD-10-CM | POA: Diagnosis not present

## 2020-09-29 DIAGNOSIS — Z6833 Body mass index (BMI) 33.0-33.9, adult: Secondary | ICD-10-CM | POA: Diagnosis not present

## 2020-09-29 MED FILL — BUPRENORPHINE HCL-NALOXONE: 8-2 | 30 days supply | Qty: 60 | Fill #0

## 2020-10-02 ENCOUNTER — Other Ambulatory Visit (HOSPITAL_COMMUNITY): Payer: Self-pay | Admitting: Internal Medicine

## 2020-10-02 MED FILL — ATORVASTATIN 40 MG TABLET: 40 | 90 days supply | Qty: 90 | Fill #1

## 2020-10-02 MED FILL — GABAPENTIN 600 MG TABLET: 600 | 90 days supply | Qty: 360 | Fill #0

## 2020-10-08 MED FILL — METOPROLOL SUCCINATE ER 100: 100 | 90 days supply | Qty: 90 | Fill #1

## 2020-10-28 ENCOUNTER — Other Ambulatory Visit (HOSPITAL_COMMUNITY): Payer: Self-pay | Admitting: Internal Medicine

## 2020-10-28 DIAGNOSIS — F1120 Opioid dependence, uncomplicated: Secondary | ICD-10-CM | POA: Diagnosis not present

## 2020-10-28 DIAGNOSIS — Z79899 Other long term (current) drug therapy: Secondary | ICD-10-CM | POA: Diagnosis not present

## 2020-10-28 DIAGNOSIS — Z6834 Body mass index (BMI) 34.0-34.9, adult: Secondary | ICD-10-CM | POA: Diagnosis not present

## 2020-10-28 DIAGNOSIS — Z79891 Long term (current) use of opiate analgesic: Secondary | ICD-10-CM | POA: Diagnosis not present

## 2020-10-28 MED FILL — BUPRENORPHINE HCL-NALOXONE: 8-2 | 30 days supply | Qty: 60 | Fill #0

## 2020-10-30 DIAGNOSIS — Z7984 Long term (current) use of oral hypoglycemic drugs: Secondary | ICD-10-CM | POA: Diagnosis not present

## 2020-10-30 DIAGNOSIS — I10 Essential (primary) hypertension: Secondary | ICD-10-CM | POA: Diagnosis not present

## 2020-10-30 DIAGNOSIS — E782 Mixed hyperlipidemia: Secondary | ICD-10-CM | POA: Diagnosis not present

## 2020-10-30 DIAGNOSIS — E1165 Type 2 diabetes mellitus with hyperglycemia: Secondary | ICD-10-CM | POA: Diagnosis not present

## 2020-10-30 DIAGNOSIS — F324 Major depressive disorder, single episode, in partial remission: Secondary | ICD-10-CM | POA: Diagnosis not present

## 2020-10-30 DIAGNOSIS — E291 Testicular hypofunction: Secondary | ICD-10-CM | POA: Diagnosis not present

## 2020-11-03 MED FILL — ALPRAZolam 1 MG TABS: 1 | 30 days supply | Qty: 120 | Fill #1

## 2020-11-09 MED FILL — METFORMIN HCL ER 500 MG TB2: 500 | 90 days supply | Qty: 360 | Fill #1

## 2020-11-09 MED FILL — buPROPion HCL ER (XL) 300 M: 300 | 90 days supply | Qty: 90 | Fill #1

## 2020-11-27 ENCOUNTER — Other Ambulatory Visit (HOSPITAL_COMMUNITY): Payer: Self-pay | Admitting: Internal Medicine

## 2020-11-27 DIAGNOSIS — F1120 Opioid dependence, uncomplicated: Secondary | ICD-10-CM | POA: Diagnosis not present

## 2020-11-27 DIAGNOSIS — Z79899 Other long term (current) drug therapy: Secondary | ICD-10-CM | POA: Diagnosis not present

## 2020-11-27 DIAGNOSIS — Z6834 Body mass index (BMI) 34.0-34.9, adult: Secondary | ICD-10-CM | POA: Diagnosis not present

## 2020-11-27 DIAGNOSIS — Z79891 Long term (current) use of opiate analgesic: Secondary | ICD-10-CM | POA: Diagnosis not present

## 2020-11-27 MED FILL — BUPRENORPHINE HCL-NALOXONE: 8-2 | 30 days supply | Qty: 60 | Fill #0

## 2020-12-02 MED FILL — VIIBRYD 40 MG TABLET: 40 | 90 days supply | Qty: 90 | Fill #1

## 2020-12-09 ENCOUNTER — Other Ambulatory Visit (HOSPITAL_COMMUNITY): Payer: Self-pay | Admitting: Family Medicine

## 2020-12-09 DIAGNOSIS — A084 Viral intestinal infection, unspecified: Secondary | ICD-10-CM | POA: Diagnosis not present

## 2020-12-09 DIAGNOSIS — R110 Nausea: Secondary | ICD-10-CM | POA: Diagnosis not present

## 2020-12-09 DIAGNOSIS — Z7984 Long term (current) use of oral hypoglycemic drugs: Secondary | ICD-10-CM | POA: Diagnosis not present

## 2020-12-09 DIAGNOSIS — E119 Type 2 diabetes mellitus without complications: Secondary | ICD-10-CM | POA: Diagnosis not present

## 2020-12-09 MED FILL — ONDANSETRON HCL 8 MG TABLET: 8 | 7 days supply | Qty: 20 | Fill #0

## 2020-12-25 ENCOUNTER — Other Ambulatory Visit (HOSPITAL_COMMUNITY): Payer: Self-pay | Admitting: Internal Medicine

## 2020-12-25 DIAGNOSIS — Z6834 Body mass index (BMI) 34.0-34.9, adult: Secondary | ICD-10-CM | POA: Diagnosis not present

## 2020-12-25 DIAGNOSIS — Z79899 Other long term (current) drug therapy: Secondary | ICD-10-CM | POA: Diagnosis not present

## 2020-12-25 DIAGNOSIS — Z79891 Long term (current) use of opiate analgesic: Secondary | ICD-10-CM | POA: Diagnosis not present

## 2020-12-25 DIAGNOSIS — F1120 Opioid dependence, uncomplicated: Secondary | ICD-10-CM | POA: Diagnosis not present

## 2020-12-25 MED FILL — BUPREN-NALOX FILM 8-2MG: 8-2 | 30 days supply | Qty: 60 | Fill #0

## 2020-12-30 MED FILL — ALPRAZolam 1 MG TABS: 1 | 30 days supply | Qty: 120 | Fill #2

## 2020-12-31 ENCOUNTER — Other Ambulatory Visit (HOSPITAL_COMMUNITY): Payer: Self-pay | Admitting: Family Medicine

## 2020-12-31 MED FILL — ATORVASTATIN 40 MG TABLET: 40 | 90 days supply | Qty: 90 | Fill #0

## 2021-01-08 ENCOUNTER — Other Ambulatory Visit (HOSPITAL_COMMUNITY): Payer: Self-pay | Admitting: Family Medicine

## 2021-01-08 MED FILL — METOPROLOL SUCCINATE ER 100: 100 | 90 days supply | Qty: 90 | Fill #0

## 2021-01-19 ENCOUNTER — Other Ambulatory Visit (HOSPITAL_COMMUNITY): Payer: Self-pay | Admitting: Family Medicine

## 2021-01-19 DIAGNOSIS — F419 Anxiety disorder, unspecified: Secondary | ICD-10-CM | POA: Diagnosis not present

## 2021-01-19 DIAGNOSIS — R110 Nausea: Secondary | ICD-10-CM | POA: Diagnosis not present

## 2021-01-19 DIAGNOSIS — E1165 Type 2 diabetes mellitus with hyperglycemia: Secondary | ICD-10-CM | POA: Diagnosis not present

## 2021-01-19 DIAGNOSIS — I10 Essential (primary) hypertension: Secondary | ICD-10-CM | POA: Diagnosis not present

## 2021-01-19 MED FILL — FREESTYLE LITE TEST STRIP: 50 days supply | Qty: 50 | Fill #0

## 2021-01-19 MED FILL — ONDANSETRON HCL 8 MG TABLET: 8 | 7 days supply | Qty: 20 | Fill #0

## 2021-01-25 ENCOUNTER — Other Ambulatory Visit (HOSPITAL_COMMUNITY): Payer: Self-pay | Admitting: Internal Medicine

## 2021-01-25 DIAGNOSIS — M5441 Lumbago with sciatica, right side: Secondary | ICD-10-CM | POA: Diagnosis not present

## 2021-01-25 DIAGNOSIS — M5442 Lumbago with sciatica, left side: Secondary | ICD-10-CM | POA: Diagnosis not present

## 2021-01-25 DIAGNOSIS — F1120 Opioid dependence, uncomplicated: Secondary | ICD-10-CM | POA: Diagnosis not present

## 2021-01-25 DIAGNOSIS — Z79899 Other long term (current) drug therapy: Secondary | ICD-10-CM | POA: Diagnosis not present

## 2021-01-25 DIAGNOSIS — G8929 Other chronic pain: Secondary | ICD-10-CM | POA: Diagnosis not present

## 2021-01-25 MED FILL — BUPRENORPHINE HCL-NALOXONE: 8-2 | 30 days supply | Qty: 60 | Fill #0

## 2021-01-29 ENCOUNTER — Other Ambulatory Visit (HOSPITAL_COMMUNITY): Payer: Self-pay | Admitting: Family Medicine

## 2021-01-29 MED FILL — METFORMIN HCL ER 500 MG TB2: 500 | 90 days supply | Qty: 360 | Fill #0

## 2021-02-05 ENCOUNTER — Other Ambulatory Visit (HOSPITAL_COMMUNITY): Payer: Self-pay | Admitting: Family Medicine

## 2021-02-11 MED FILL — METFORMIN HCL ER 500 MG TB2: 500 | 90 days supply | Qty: 360 | Fill #0

## 2021-02-11 MED FILL — GABAPENTIN 600 MG TABLET: 600 | 90 days supply | Qty: 360 | Fill #1

## 2021-02-12 ENCOUNTER — Other Ambulatory Visit (HOSPITAL_COMMUNITY): Payer: Self-pay | Admitting: Family Medicine

## 2021-02-12 DIAGNOSIS — E291 Testicular hypofunction: Secondary | ICD-10-CM | POA: Diagnosis not present

## 2021-02-12 MED FILL — buPROPion HCL ER (XL) 300 M: 300 | 90 days supply | Qty: 90 | Fill #0

## 2021-02-23 ENCOUNTER — Other Ambulatory Visit (HOSPITAL_COMMUNITY): Payer: Self-pay | Admitting: Internal Medicine

## 2021-02-23 DIAGNOSIS — Z79899 Other long term (current) drug therapy: Secondary | ICD-10-CM | POA: Diagnosis not present

## 2021-02-23 DIAGNOSIS — Z79891 Long term (current) use of opiate analgesic: Secondary | ICD-10-CM | POA: Diagnosis not present

## 2021-02-23 DIAGNOSIS — F1120 Opioid dependence, uncomplicated: Secondary | ICD-10-CM | POA: Diagnosis not present

## 2021-02-23 DIAGNOSIS — E1169 Type 2 diabetes mellitus with other specified complication: Secondary | ICD-10-CM | POA: Diagnosis not present

## 2021-02-23 MED FILL — BUPRENORPHINE HCL-NALOXONE: 8-2 | 30 days supply | Qty: 60 | Fill #0

## 2021-02-24 MED FILL — ALPRAZolam 1 MG TABS: 1 | 30 days supply | Qty: 120 | Fill #3

## 2021-03-12 DIAGNOSIS — E291 Testicular hypofunction: Secondary | ICD-10-CM | POA: Diagnosis not present

## 2021-03-18 ENCOUNTER — Other Ambulatory Visit (HOSPITAL_COMMUNITY): Payer: Self-pay | Admitting: Family Medicine

## 2021-03-20 ENCOUNTER — Other Ambulatory Visit (HOSPITAL_COMMUNITY): Payer: Self-pay

## 2021-03-20 MED FILL — Semaglutide Soln Pen-inj 0.25 or 0.5 MG/DOSE (2 MG/1.5ML): SUBCUTANEOUS | 28 days supply | Qty: 1.5 | Fill #0 | Status: AC

## 2021-03-25 ENCOUNTER — Other Ambulatory Visit (HOSPITAL_COMMUNITY): Payer: Self-pay

## 2021-03-25 DIAGNOSIS — Z6832 Body mass index (BMI) 32.0-32.9, adult: Secondary | ICD-10-CM | POA: Diagnosis not present

## 2021-03-25 DIAGNOSIS — Z79891 Long term (current) use of opiate analgesic: Secondary | ICD-10-CM | POA: Diagnosis not present

## 2021-03-25 DIAGNOSIS — F1120 Opioid dependence, uncomplicated: Secondary | ICD-10-CM | POA: Diagnosis not present

## 2021-03-25 DIAGNOSIS — Z79899 Other long term (current) drug therapy: Secondary | ICD-10-CM | POA: Diagnosis not present

## 2021-03-25 MED ORDER — BUPRENORPHINE HCL-NALOXONE HCL 8-2 MG SL FILM
1.0000 | ORAL_FILM | Freq: Two times a day (BID) | SUBLINGUAL | 0 refills | Status: DC
  Filled 2021-03-25: qty 60, 30d supply, fill #0

## 2021-04-06 ENCOUNTER — Other Ambulatory Visit (HOSPITAL_COMMUNITY): Payer: Self-pay

## 2021-04-06 MED ORDER — METOPROLOL SUCCINATE ER 100 MG PO TB24
100.0000 mg | ORAL_TABLET | Freq: Every day | ORAL | 0 refills | Status: DC
  Filled 2021-04-06: qty 90, 90d supply, fill #0

## 2021-04-06 MED FILL — Atorvastatin Calcium Tab 40 MG (Base Equivalent): ORAL | 90 days supply | Qty: 90 | Fill #0 | Status: AC

## 2021-04-16 DIAGNOSIS — E291 Testicular hypofunction: Secondary | ICD-10-CM | POA: Diagnosis not present

## 2021-04-21 ENCOUNTER — Other Ambulatory Visit (HOSPITAL_COMMUNITY): Payer: Self-pay

## 2021-04-23 ENCOUNTER — Other Ambulatory Visit (HOSPITAL_COMMUNITY): Payer: Self-pay

## 2021-04-24 ENCOUNTER — Other Ambulatory Visit (HOSPITAL_COMMUNITY): Payer: Self-pay

## 2021-04-24 MED ORDER — ALPRAZOLAM 1 MG PO TABS
2.0000 mg | ORAL_TABLET | Freq: Two times a day (BID) | ORAL | 3 refills | Status: DC | PRN
  Filled 2021-04-24: qty 120, 30d supply, fill #0
  Filled 2021-06-24: qty 120, 30d supply, fill #1
  Filled 2021-08-18: qty 120, 30d supply, fill #2
  Filled 2021-10-13: qty 120, 30d supply, fill #3

## 2021-04-26 ENCOUNTER — Other Ambulatory Visit (HOSPITAL_COMMUNITY): Payer: Self-pay

## 2021-04-27 ENCOUNTER — Other Ambulatory Visit (HOSPITAL_COMMUNITY): Payer: Self-pay

## 2021-04-27 DIAGNOSIS — F1120 Opioid dependence, uncomplicated: Secondary | ICD-10-CM | POA: Diagnosis not present

## 2021-04-27 DIAGNOSIS — M1612 Unilateral primary osteoarthritis, left hip: Secondary | ICD-10-CM | POA: Diagnosis not present

## 2021-04-27 DIAGNOSIS — Z79891 Long term (current) use of opiate analgesic: Secondary | ICD-10-CM | POA: Diagnosis not present

## 2021-04-27 DIAGNOSIS — Z79899 Other long term (current) drug therapy: Secondary | ICD-10-CM | POA: Diagnosis not present

## 2021-04-27 MED ORDER — BUPRENORPHINE HCL-NALOXONE HCL 8-2 MG SL FILM
1.0000 | ORAL_FILM | Freq: Two times a day (BID) | SUBLINGUAL | 0 refills | Status: DC
  Filled 2021-04-27: qty 60, 30d supply, fill #0

## 2021-05-12 ENCOUNTER — Other Ambulatory Visit (HOSPITAL_COMMUNITY): Payer: Self-pay

## 2021-05-13 ENCOUNTER — Other Ambulatory Visit (HOSPITAL_COMMUNITY): Payer: Self-pay

## 2021-05-13 DIAGNOSIS — E291 Testicular hypofunction: Secondary | ICD-10-CM | POA: Diagnosis not present

## 2021-05-13 MED ORDER — METFORMIN HCL ER 500 MG PO TB24
1000.0000 mg | ORAL_TABLET | Freq: Two times a day (BID) | ORAL | 6 refills | Status: DC
  Filled 2021-05-13: qty 120, 30d supply, fill #0
  Filled 2021-06-16: qty 120, 30d supply, fill #1
  Filled 2021-07-16: qty 120, 30d supply, fill #2
  Filled 2021-08-18: qty 360, 90d supply, fill #3
  Filled 2021-11-15: qty 120, 30d supply, fill #4

## 2021-05-14 ENCOUNTER — Other Ambulatory Visit (HOSPITAL_COMMUNITY): Payer: Self-pay

## 2021-05-20 ENCOUNTER — Other Ambulatory Visit (HOSPITAL_COMMUNITY): Payer: Self-pay

## 2021-05-20 MED ORDER — CARESTART COVID-19 HOME TEST VI KIT
PACK | 0 refills | Status: DC
  Filled 2021-05-20: qty 4, 4d supply, fill #0

## 2021-05-20 MED ORDER — VIIBRYD 40 MG PO TABS
40.0000 mg | ORAL_TABLET | Freq: Every day | ORAL | 0 refills | Status: DC
  Filled 2021-05-20: qty 90, 90d supply, fill #0

## 2021-05-21 ENCOUNTER — Other Ambulatory Visit (HOSPITAL_COMMUNITY): Payer: Self-pay

## 2021-05-21 MED FILL — Bupropion HCl Tab ER 24HR 300 MG: ORAL | 90 days supply | Qty: 90 | Fill #0 | Status: AC

## 2021-05-26 ENCOUNTER — Other Ambulatory Visit (HOSPITAL_COMMUNITY): Payer: Self-pay

## 2021-05-26 DIAGNOSIS — Z79899 Other long term (current) drug therapy: Secondary | ICD-10-CM | POA: Diagnosis not present

## 2021-05-26 DIAGNOSIS — M1612 Unilateral primary osteoarthritis, left hip: Secondary | ICD-10-CM | POA: Diagnosis not present

## 2021-05-26 DIAGNOSIS — T402X5A Adverse effect of other opioids, initial encounter: Secondary | ICD-10-CM | POA: Diagnosis not present

## 2021-05-26 DIAGNOSIS — M25561 Pain in right knee: Secondary | ICD-10-CM | POA: Diagnosis not present

## 2021-05-26 DIAGNOSIS — K5903 Drug induced constipation: Secondary | ICD-10-CM | POA: Diagnosis not present

## 2021-05-26 MED ORDER — BUPRENORPHINE HCL-NALOXONE HCL 8-2 MG SL FILM
1.0000 | ORAL_FILM | Freq: Two times a day (BID) | SUBLINGUAL | 0 refills | Status: DC
  Filled 2021-05-26: qty 60, 30d supply, fill #0

## 2021-05-26 MED ORDER — GABAPENTIN 600 MG PO TABS
600.0000 mg | ORAL_TABLET | Freq: Four times a day (QID) | ORAL | 1 refills | Status: DC
  Filled 2021-05-26: qty 360, 90d supply, fill #0
  Filled 2021-09-27: qty 360, 90d supply, fill #1

## 2021-06-08 ENCOUNTER — Other Ambulatory Visit (HOSPITAL_COMMUNITY): Payer: Self-pay

## 2021-06-08 MED FILL — Semaglutide Soln Pen-inj 0.25 or 0.5 MG/DOSE (2 MG/1.5ML): SUBCUTANEOUS | 28 days supply | Qty: 1.5 | Fill #1 | Status: AC

## 2021-06-15 DIAGNOSIS — E291 Testicular hypofunction: Secondary | ICD-10-CM | POA: Diagnosis not present

## 2021-06-16 ENCOUNTER — Other Ambulatory Visit (HOSPITAL_COMMUNITY): Payer: Self-pay

## 2021-06-24 ENCOUNTER — Other Ambulatory Visit (HOSPITAL_COMMUNITY): Payer: Self-pay

## 2021-06-24 DIAGNOSIS — T402X5A Adverse effect of other opioids, initial encounter: Secondary | ICD-10-CM | POA: Diagnosis not present

## 2021-06-24 DIAGNOSIS — Z79899 Other long term (current) drug therapy: Secondary | ICD-10-CM | POA: Diagnosis not present

## 2021-06-24 DIAGNOSIS — M25561 Pain in right knee: Secondary | ICD-10-CM | POA: Diagnosis not present

## 2021-06-24 DIAGNOSIS — K5903 Drug induced constipation: Secondary | ICD-10-CM | POA: Diagnosis not present

## 2021-06-24 DIAGNOSIS — M1612 Unilateral primary osteoarthritis, left hip: Secondary | ICD-10-CM | POA: Diagnosis not present

## 2021-06-24 MED ORDER — BUPRENORPHINE HCL-NALOXONE HCL 8-2 MG SL FILM
1.0000 | ORAL_FILM | Freq: Two times a day (BID) | SUBLINGUAL | 0 refills | Status: DC
  Filled 2021-06-24: qty 60, 30d supply, fill #0

## 2021-06-25 ENCOUNTER — Other Ambulatory Visit (HOSPITAL_COMMUNITY): Payer: Self-pay

## 2021-07-05 ENCOUNTER — Other Ambulatory Visit (HOSPITAL_COMMUNITY): Payer: Self-pay

## 2021-07-06 ENCOUNTER — Other Ambulatory Visit (HOSPITAL_COMMUNITY): Payer: Self-pay

## 2021-07-06 MED ORDER — METOPROLOL SUCCINATE ER 100 MG PO TB24
100.0000 mg | ORAL_TABLET | Freq: Every day | ORAL | 0 refills | Status: DC
  Filled 2021-07-06: qty 90, 90d supply, fill #0

## 2021-07-06 MED ORDER — ATORVASTATIN CALCIUM 40 MG PO TABS
40.0000 mg | ORAL_TABLET | Freq: Every day | ORAL | 1 refills | Status: DC
  Filled 2021-07-06: qty 90, 90d supply, fill #0
  Filled 2021-10-06: qty 90, 90d supply, fill #1

## 2021-07-07 ENCOUNTER — Other Ambulatory Visit (HOSPITAL_COMMUNITY): Payer: Self-pay

## 2021-07-15 DIAGNOSIS — E291 Testicular hypofunction: Secondary | ICD-10-CM | POA: Diagnosis not present

## 2021-07-16 ENCOUNTER — Other Ambulatory Visit (HOSPITAL_COMMUNITY): Payer: Self-pay

## 2021-07-22 DIAGNOSIS — Z79891 Long term (current) use of opiate analgesic: Secondary | ICD-10-CM | POA: Diagnosis not present

## 2021-07-22 DIAGNOSIS — Z79899 Other long term (current) drug therapy: Secondary | ICD-10-CM | POA: Diagnosis not present

## 2021-07-22 DIAGNOSIS — Z5181 Encounter for therapeutic drug level monitoring: Secondary | ICD-10-CM | POA: Diagnosis not present

## 2021-07-22 DIAGNOSIS — F1120 Opioid dependence, uncomplicated: Secondary | ICD-10-CM | POA: Diagnosis not present

## 2021-07-23 ENCOUNTER — Other Ambulatory Visit (HOSPITAL_COMMUNITY): Payer: Self-pay

## 2021-07-23 MED ORDER — BUPRENORPHINE HCL-NALOXONE HCL 8-2 MG SL FILM
1.0000 | ORAL_FILM | SUBLINGUAL | 0 refills | Status: DC
  Filled 2021-07-23: qty 60, 30d supply, fill #0

## 2021-07-30 DIAGNOSIS — Z79899 Other long term (current) drug therapy: Secondary | ICD-10-CM | POA: Diagnosis not present

## 2021-08-13 DIAGNOSIS — E291 Testicular hypofunction: Secondary | ICD-10-CM | POA: Diagnosis not present

## 2021-08-18 ENCOUNTER — Other Ambulatory Visit (HOSPITAL_COMMUNITY): Payer: Self-pay

## 2021-08-18 DIAGNOSIS — E782 Mixed hyperlipidemia: Secondary | ICD-10-CM | POA: Diagnosis not present

## 2021-08-18 DIAGNOSIS — G609 Hereditary and idiopathic neuropathy, unspecified: Secondary | ICD-10-CM | POA: Diagnosis not present

## 2021-08-18 DIAGNOSIS — F324 Major depressive disorder, single episode, in partial remission: Secondary | ICD-10-CM | POA: Diagnosis not present

## 2021-08-18 DIAGNOSIS — E1165 Type 2 diabetes mellitus with hyperglycemia: Secondary | ICD-10-CM | POA: Diagnosis not present

## 2021-08-18 DIAGNOSIS — E291 Testicular hypofunction: Secondary | ICD-10-CM | POA: Diagnosis not present

## 2021-08-18 DIAGNOSIS — Z125 Encounter for screening for malignant neoplasm of prostate: Secondary | ICD-10-CM | POA: Diagnosis not present

## 2021-08-18 DIAGNOSIS — N529 Male erectile dysfunction, unspecified: Secondary | ICD-10-CM | POA: Diagnosis not present

## 2021-08-18 DIAGNOSIS — F419 Anxiety disorder, unspecified: Secondary | ICD-10-CM | POA: Diagnosis not present

## 2021-08-18 DIAGNOSIS — I10 Essential (primary) hypertension: Secondary | ICD-10-CM | POA: Diagnosis not present

## 2021-08-18 MED ORDER — VILAZODONE HCL 40 MG PO TABS
40.0000 mg | ORAL_TABLET | Freq: Every day | ORAL | 0 refills | Status: DC
  Filled 2021-08-18: qty 90, 90d supply, fill #0

## 2021-08-18 MED FILL — Bupropion HCl Tab ER 24HR 300 MG: ORAL | 90 days supply | Qty: 90 | Fill #1 | Status: AC

## 2021-08-20 ENCOUNTER — Other Ambulatory Visit (HOSPITAL_COMMUNITY): Payer: Self-pay

## 2021-08-20 DIAGNOSIS — E785 Hyperlipidemia, unspecified: Secondary | ICD-10-CM | POA: Diagnosis not present

## 2021-08-20 DIAGNOSIS — F1120 Opioid dependence, uncomplicated: Secondary | ICD-10-CM | POA: Diagnosis not present

## 2021-08-20 DIAGNOSIS — E1169 Type 2 diabetes mellitus with other specified complication: Secondary | ICD-10-CM | POA: Diagnosis not present

## 2021-08-20 DIAGNOSIS — I10 Essential (primary) hypertension: Secondary | ICD-10-CM | POA: Diagnosis not present

## 2021-08-20 DIAGNOSIS — Z79899 Other long term (current) drug therapy: Secondary | ICD-10-CM | POA: Diagnosis not present

## 2021-08-20 MED ORDER — BUPRENORPHINE HCL-NALOXONE HCL 8-2 MG SL FILM
1.0000 | ORAL_FILM | Freq: Two times a day (BID) | SUBLINGUAL | 0 refills | Status: DC
  Filled 2021-08-20: qty 60, 30d supply, fill #0

## 2021-09-03 ENCOUNTER — Other Ambulatory Visit (HOSPITAL_COMMUNITY): Payer: Self-pay

## 2021-09-03 MED FILL — Semaglutide Soln Pen-inj 0.25 or 0.5 MG/DOSE (2 MG/1.5ML): SUBCUTANEOUS | 84 days supply | Qty: 4.5 | Fill #2 | Status: AC

## 2021-09-08 ENCOUNTER — Other Ambulatory Visit: Payer: Self-pay

## 2021-09-08 ENCOUNTER — Telehealth: Payer: Self-pay | Admitting: Orthopaedic Surgery

## 2021-09-08 DIAGNOSIS — G8929 Other chronic pain: Secondary | ICD-10-CM

## 2021-09-08 DIAGNOSIS — M545 Low back pain, unspecified: Secondary | ICD-10-CM

## 2021-09-08 NOTE — Telephone Encounter (Signed)
Pt called requesting a call back from [facility]. 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 Pt is asking for a call back. Phone number is [phone].

## 2021-09-08 NOTE — Telephone Encounter (Signed)
Sent PT order per his request

## 2021-09-10 DIAGNOSIS — E291 Testicular hypofunction: Secondary | ICD-10-CM | POA: Diagnosis not present

## 2021-09-15 ENCOUNTER — Ambulatory Visit: Payer: 59 | Admitting: Orthopaedic Surgery

## 2021-09-22 ENCOUNTER — Other Ambulatory Visit (HOSPITAL_COMMUNITY): Payer: Self-pay

## 2021-09-22 DIAGNOSIS — Z79891 Long term (current) use of opiate analgesic: Secondary | ICD-10-CM | POA: Diagnosis not present

## 2021-09-22 DIAGNOSIS — Z79899 Other long term (current) drug therapy: Secondary | ICD-10-CM | POA: Diagnosis not present

## 2021-09-22 DIAGNOSIS — F1120 Opioid dependence, uncomplicated: Secondary | ICD-10-CM | POA: Diagnosis not present

## 2021-09-22 DIAGNOSIS — Z6831 Body mass index (BMI) 31.0-31.9, adult: Secondary | ICD-10-CM | POA: Diagnosis not present

## 2021-09-22 MED ORDER — BUPRENORPHINE HCL-NALOXONE HCL 8-2 MG SL FILM
1.0000 | ORAL_FILM | Freq: Two times a day (BID) | SUBLINGUAL | 0 refills | Status: DC
  Filled 2021-09-22: qty 60, 30d supply, fill #0

## 2021-09-27 ENCOUNTER — Other Ambulatory Visit (HOSPITAL_COMMUNITY): Payer: Self-pay

## 2021-09-29 ENCOUNTER — Other Ambulatory Visit: Payer: Self-pay

## 2021-09-29 ENCOUNTER — Encounter (HOSPITAL_BASED_OUTPATIENT_CLINIC_OR_DEPARTMENT_OTHER): Payer: Self-pay | Admitting: Physical Therapy

## 2021-09-29 ENCOUNTER — Ambulatory Visit (HOSPITAL_BASED_OUTPATIENT_CLINIC_OR_DEPARTMENT_OTHER): Payer: 59 | Attending: Orthopaedic Surgery | Admitting: Physical Therapy

## 2021-09-29 DIAGNOSIS — M419 Scoliosis, unspecified: Secondary | ICD-10-CM | POA: Insufficient documentation

## 2021-09-29 DIAGNOSIS — R2689 Other abnormalities of gait and mobility: Secondary | ICD-10-CM | POA: Insufficient documentation

## 2021-09-29 DIAGNOSIS — M545 Low back pain, unspecified: Secondary | ICD-10-CM | POA: Insufficient documentation

## 2021-09-29 DIAGNOSIS — R293 Abnormal posture: Secondary | ICD-10-CM | POA: Insufficient documentation

## 2021-09-29 DIAGNOSIS — G8929 Other chronic pain: Secondary | ICD-10-CM | POA: Insufficient documentation

## 2021-09-29 DIAGNOSIS — Z96641 Presence of right artificial hip joint: Secondary | ICD-10-CM | POA: Diagnosis not present

## 2021-09-29 DIAGNOSIS — M5441 Lumbago with sciatica, right side: Secondary | ICD-10-CM

## 2021-09-29 NOTE — Therapy (Signed)
OUTPATIENT PHYSICAL THERAPY THORACOLUMBAR EVALUATION   Patient Name: Brandon Gordon MRN: [id number] DOB:17-Oct-1971, 50 y.o., male Today's Date: 09/29/2021    Past Medical History:  Diagnosis Date   Anxiety    At risk for sleep apnea    STOP-BANG= 5      SENT TO PCP 04-05-2016   Chronic pain syndrome    History of kidney stones    Hyperlipidemia    Hypertension    Left ureteral stone    OA (osteoarthritis)    back, hip   Pain management    Type 2 diabetes mellitus (HCC)    Past Surgical History:  Procedure Laterality Date   TOTAL HIP ARTHROPLASTY Right 04-22-2011   There are no problems to display for this patient.   PCP: Johny Blamer, MD  REFERRING PROVIDER: Kathryne Hitch*  REFERRING DIAG: X32.44,W10.27 (ICD-10-CM) - Chronic bilateral low back pain, unspecified whether sciatica present  THERAPY DIAG:  Low Back Pain   ONSET DATE:   SUBJECTIVE:                                                                                                                                                                                           SUBJECTIVE STATEMENT: Patient has a long history of low back pain. He has a lifelong diagnosis of scoliosis. He has been having pain going down his right leg for about 10 years. He had a right hip replacement as well 10 years ago. He feels like he s having more difficulty standing over the past few years. He feels like he is bending over more. He is athletic and would like to getting back to playing sports. He swims on a regular basis.  PERTINENT HISTORY:  Anxiety, Kidney stones, DMII, HTN, Potential mensicus tear on the right   PAIN:  Are you having pain? Yes VAS scale: 2/10 Pain location: Low Back  Pain orientation: Right and Left  PAIN TYPE: aching Pain description: intermittent  Aggravating factors: night time  Relieving factors: time and rest   PRECAUTIONS: None  WEIGHT BEARING RESTRICTIONS No  FALLS:  Has patient  fallen in last 6 months? 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 [employer]   PLOF: Independent Hobbies: swimming and boxing   PATIENT GOALS :   To straighten up and reduce pain;     OBJECTIVE:   DIAGNOSTIC FINDINGS:  MRI: 2018: mild stable spondylosis    SCREENING FOR RED FLAGS: Bowel or bladder incontinence: No  Spinal tumors: No Cauda equina syndrome: No Compression fracture: No Abdominal aneurysm: No  COGNITION:  Overall cognitive status: Within functional limits for tasks assessed     SENSATION:  Light touch: Intact   Stereognosis: Appears intact  Hot/Cold: Appears intact  Proprioception: Appears intact Begging to develop numbness in the hands and feet   POSTURE:  Leans to the left and rotated to the left   LUMBARAROM/PROM  A/PROM A/PROM  09/29/2021  Flexion 10-50  Extension Standing 10 degrees of flexion not able to come to neutral extension   Right lateral flexion   Left lateral flexion Stands in left flexion   Right rotation   Left rotation Stands in left rotation    (Blank rows = not tested)  LE AROM/PROM:  A/PROM Right 09/29/2021 Left 09/29/2021  Hip flexion  Pain with hip flexion       Hip extension  Left hip unable to come to neutral   Hip abduction    Hip adduction    Hip internal rotation Painful  Painful   Hip external rotation    Knee flexion    Knee extension    Ankle dorsiflexion    Ankle plantarflexion    Ankle inversion    Ankle eversion     (Blank rows = not tested)  LE MMT:  MMT Right 09/29/2021 Left 09/29/2021  Hip flexion 25.3 32.4  Hip extension    Hip abduction 24.1 28.5  Hip adduction    Hip internal rotation    Hip external rotation    Knee flexion 28.5  23.9  Knee extension 47.6 34.3  Ankle dorsiflexion    Ankle plantarflexion    Ankle inversion    Ankle eversion     (Blank rows = not tested)  Palpation:  significant shortening of the left QL and lumbar multifidi; spasming of the left gluteal; right throacic spasming.    GAIT: Comments: Ambulates with flexed posture with left anterior rotation.     TODAY'S TREATMENT  Manual: trigger point release to left QL in sidelying and trigger point release to left gluteal   Program Notes Reach up the doorway for a stretch             Exercises Reaching in Sidelying - 1 x daily - 7 x weekly - 3 sets - 10 reps Theracane Over Shoulder - 1 x daily - 7 x weekly - 3 sets - 10 reps    PATIENT EDUCATION:  Education details: unilateral stretching; anatomy of condition; benefits of trigger point release; self trigger point release Person educated: Patient Education method: Explanation, Demonstration, Tactile cues, Verbal cues, and Handouts Education comprehension: verbalized understanding, returned demonstration, verbal cues required, tactile cues required, and needs further education   HOME EXERCISE PROGRAM: Access Code: [email or web address] URL: [email or web address] Date: 09/29/2021 Prepared by: Lorayne Bender Program Notes Reach up the doorway for a stretch  Exercises Reaching in Sidelying - 1 x daily - 7 x weekly - 3 sets - 10 reps Theracane Over Shoulder - 1 x daily - 7 x weekly - 3 sets - 10 reps   ASSESSMENT:  CLINICAL IMPRESSION: Patient is a 50 year old male with a long history of lower back pain with right sided sciatic pain. He has a scoliosis with left side bend and rotation. He has spasming in his lumbar spine and into his right gluteal. Today he had difficulty fully extending  his left hip out to neutral in a supine position. He has hip weakness on the right compared to the left. He has increased pain as the day goes on, with the worst pain happening at night. He would  benefit from skilled therapy to improve posture as close to neutral as possible, improve ability to perform exercise and sporting activity, and to control pain with daily activity.   Abnormal gait, decreased activity tolerance, decreased mobility, difficulty walking, decreased ROM, decreased strength, increased fascial restrictions, increased muscle spasms, and pain  cleaning, community activity, yard work, and shopping  Time since onset of injury/illness/exacerbation and 1-2 comorbidities: right knee injury stemming from fall; right hip replacement; multi joint OA   REHAB POTENTIAL: Good  CLINICAL DECISION MAKING: Evolving/moderate complexity increasing flexion and forward rotation  EVALUATION COMPLEXITY: Moderate   GOALS: Goals reviewed with patient? Yes  SHORT TERM GOALS:  STG Name Target Date Goal status  1 Patient will improve standing flexion from 10-5 degrees  Baseline:  10/27/2021 INITIAL  2 Patient will increase right hip flexion strength by 5 lbs  Baseline:  10/27/2021 INITIAL  3 Patient will be independent with basic stretching and self trigger point release program  Baseline: 10/27/2021 INITIAL   LONG TERM GOALS:   LTG Name Target Date Goal status  1 Patient will stand with neutral posture without increased pain or effort in order to perform daily activity  Baseline: 11/24/2021 INITIAL  2 Patient will be independent with a complete HEP including land and pool exercises  Baseline: 11/24/2021 INITIAL  3 Patient will return to golf and sporting activity without a significant increase in pain  Baseline: 11/24/2021 INITIAL   PLAN: PT FREQUENCY: 2x/week  PT DURATION: 8 weeks  PLANNED INTERVENTIONS: Therapeutic exercises, Therapeutic activity, Neuro Muscular re-education, Balance training, Gait training, Patient/Family education, Joint mobilization, Stair training, Aquatic Therapy, Dry Needling, Electrical stimulation, Cryotherapy, Taping, Ultrasound, and Manual  therapy  PLAN FOR NEXT SESSION: If in the water work on stretching with movement; seated lengthening of the spine and hip strengthening; If the patient is on land, consider dry needling; soft tissue mobilization; left hip mobilization to improve extension; posterior chain strengthening   Dessie Coma PT DPT  09/29/2021, 1:36 PM

## 2021-10-05 ENCOUNTER — Other Ambulatory Visit: Payer: Self-pay

## 2021-10-05 ENCOUNTER — Ambulatory Visit (HOSPITAL_BASED_OUTPATIENT_CLINIC_OR_DEPARTMENT_OTHER): Payer: 59 | Admitting: Physical Therapy

## 2021-10-05 DIAGNOSIS — R2689 Other abnormalities of gait and mobility: Secondary | ICD-10-CM | POA: Diagnosis not present

## 2021-10-05 DIAGNOSIS — Z96641 Presence of right artificial hip joint: Secondary | ICD-10-CM | POA: Diagnosis not present

## 2021-10-05 DIAGNOSIS — G8929 Other chronic pain: Secondary | ICD-10-CM | POA: Diagnosis not present

## 2021-10-05 DIAGNOSIS — R293 Abnormal posture: Secondary | ICD-10-CM

## 2021-10-05 DIAGNOSIS — M5441 Lumbago with sciatica, right side: Secondary | ICD-10-CM

## 2021-10-05 DIAGNOSIS — M419 Scoliosis, unspecified: Secondary | ICD-10-CM | POA: Diagnosis not present

## 2021-10-05 DIAGNOSIS — M545 Low back pain, unspecified: Secondary | ICD-10-CM | POA: Diagnosis not present

## 2021-10-06 ENCOUNTER — Other Ambulatory Visit (HOSPITAL_COMMUNITY): Payer: Self-pay

## 2021-10-06 ENCOUNTER — Encounter (HOSPITAL_BASED_OUTPATIENT_CLINIC_OR_DEPARTMENT_OTHER): Payer: Self-pay | Admitting: Physical Therapy

## 2021-10-06 MED ORDER — METOPROLOL SUCCINATE ER 100 MG PO TB24
100.0000 mg | ORAL_TABLET | Freq: Every day | ORAL | 0 refills | Status: DC
  Filled 2021-10-06: qty 90, 90d supply, fill #0

## 2021-10-06 NOTE — Therapy (Signed)
OUTPATIENT PHYSICAL THERAPY TREATMENT NOTE   Patient Name: Brandon Gordon MRN: [id number] DOB:03/30/1971, 50 y.o., male Today's Date: 10/06/2021  PCP: Johny Blamer, MD REFERRING PROVIDER: Johny Blamer, MD    Past Medical History:  Diagnosis Date   Anxiety    At risk for sleep apnea    STOP-BANG= 5      SENT TO PCP 04-05-2016   Chronic pain syndrome    History of kidney stones    Hyperlipidemia    Hypertension    Left ureteral stone    OA (osteoarthritis)    back, hip   Pain management    Type 2 diabetes mellitus [facility])    Past Surgical History:  Procedure Laterality Date   TOTAL HIP ARTHROPLASTY Right 04-22-2011   PCP: Johny Blamer, MD REFERRING PROVIDER: Doneen Poisson   SUBJECTIVE STATEMENT: Patient reports nothing outside of his usual pain. He has tried some of the stretches. He is motivated to get into the pool.  PERTINENT HISTORY:  Anxiety, Kidney stones, DMII, HTN, Potential mensicus tear on the right    PAIN:  Are you having pain? Yes VAS scale: 3/10 Pain location: Low Back  Pain orientation: Right and Left  PAIN TYPE: aching Pain description: intermittent  Aggravating factors: night time  Relieving factors: time and rest    Today's treatment:   10/06/2021  Pt seen for aquatic therapy today.  Treatment took place in water 3.25-4 ft in depth at the [facility] pool. Temp of water was 91.  Pt entered/exited the pool via stairs (step through pattern) independently with bilat rail.  Introduction to water. Had patient stand at different levels so they could feel the different depths of the water   Warm up: heel/toe walking x4 laps across pool chest deep side stepping x4 laps from shallow to deep;  Ambulation with noodle support: long strides, march   Exercises; Slow march x20; hip extension x20; hip abduction x20; Sit to stand x20;    board trunk flexion x10 lateral board rotation x10 each way seated   With neck noddle and foot  float; push pull x20; side to side perturbations from the feet x20; press and push x20                         Seated LAQ and March x20 2lbs   Float position: rythmic movement; push and pull;  Manual:  trigger point release to right side.   Pt requires buoyancy for support and to offload joints with strengthening exercises. Viscosity of the water is needed for resistance of strengthening; water current perturbations provides challenge to standing balance unsupported, requiring increased core activation.   CLINICAL IMPRESSION:   Abnormal gait, decreased activity tolerance, decreased mobility, difficulty walking, decreased ROM, decreased strength, increased fascial restrictions, increased muscle spasms, and pain   cleaning, community activity, yard work, and shopping   Time since onset of injury/illness/exacerbation and 1-2 comorbidities: right knee injury stemming from fall; right hip replacement; multi joint OA    REHAB POTENTIAL: Good   CLINICAL DECISION MAKING: Evolving/moderate complexity increasing flexion and forward rotation   EVALUATION COMPLEXITY: Moderate     GOALS: Goals reviewed with patient? Yes   SHORT TERM GOALS:   STG Name Target Date Goal status  1 Patient will improve standing flexion from 10-5 degrees  Baseline:  10/27/2021 INITIAL  2 Patient will increase right hip flexion strength by 5 lbs  Baseline:  10/27/2021 INITIAL  3 Patient will be  independent with basic stretching and self trigger point release program  Baseline: 10/27/2021 INITIAL    LONG TERM GOALS:    LTG Name Target Date Goal status  1 Patient will stand with neutral posture without increased pain or effort in order to perform daily activity  Baseline: 11/24/2021 INITIAL  2 Patient will be independent with a complete HEP including land and pool exercises  Baseline: 11/24/2021 INITIAL  3 Patient will return to golf and sporting activity without a significant increase in pain  Baseline: 11/24/2021  INITIAL    PLAN: PT FREQUENCY: 2x/week   PT DURATION: 8 weeks   PLANNED INTERVENTIONS: Therapeutic exercises, Therapeutic activity, Neuro Muscular re-education, Balance training, Gait training, Patient/Family education, Joint mobilization, Stair training, Aquatic Therapy, Dry Needling, Electrical stimulation, Cryotherapy, Taping, Ultrasound, and Manual therapy   PLAN FOR NEXT SESSION: If in the water work on stretching with movement; seated lengthening of the spine and hip strengthening; If the patient is on land, consider dry needling; soft tissue mobilization; left hip mobilization to improve extension; posterior chain strengthening         Dessie Coma PT DPT  10/06/2021, 12:49 PM

## 2021-10-07 ENCOUNTER — Ambulatory Visit (HOSPITAL_BASED_OUTPATIENT_CLINIC_OR_DEPARTMENT_OTHER): Payer: 59 | Admitting: Physical Therapy

## 2021-10-08 DIAGNOSIS — E291 Testicular hypofunction: Secondary | ICD-10-CM | POA: Diagnosis not present

## 2021-10-12 ENCOUNTER — Encounter (HOSPITAL_BASED_OUTPATIENT_CLINIC_OR_DEPARTMENT_OTHER): Payer: 59 | Attending: Physical Therapy | Admitting: Physical Therapy

## 2021-10-12 DIAGNOSIS — R293 Abnormal posture: Secondary | ICD-10-CM | POA: Diagnosis not present

## 2021-10-12 DIAGNOSIS — R2689 Other abnormalities of gait and mobility: Secondary | ICD-10-CM | POA: Diagnosis not present

## 2021-10-12 DIAGNOSIS — M5441 Lumbago with sciatica, right side: Secondary | ICD-10-CM | POA: Insufficient documentation

## 2021-10-12 DIAGNOSIS — G8929 Other chronic pain: Secondary | ICD-10-CM | POA: Insufficient documentation

## 2021-10-13 ENCOUNTER — Encounter (HOSPITAL_BASED_OUTPATIENT_CLINIC_OR_DEPARTMENT_OTHER): Payer: Self-pay | Admitting: Physical Therapy

## 2021-10-13 ENCOUNTER — Other Ambulatory Visit (HOSPITAL_COMMUNITY): Payer: Self-pay

## 2021-10-13 ENCOUNTER — Other Ambulatory Visit: Payer: Self-pay

## 2021-10-13 NOTE — Therapy (Signed)
OUTPATIENT PHYSICAL THERAPY TREATMENT NOTE   Patient Name: Brandon Gordon MRN: [id number] DOB:May 30, 1971, 50 y.o., male Today's Date: 10/13/2021  PCP: Johny Blamer, MD REFERRING PROVIDER: Johny Blamer, MD   PT End of Session - 10/13/21 1247     Visit Number 3    Number of Visits 16    Date for PT Re-Evaluation 11/25/21    PT Start Time 1600    PT Stop Time 1643    PT Time Calculation (min) 43 min    Activity Tolerance Patient tolerated treatment well    Behavior During Therapy [facility] for tasks assessed/performed             Past Medical History:  Diagnosis Date   Anxiety    At risk for sleep apnea    STOP-BANG= 5      SENT TO PCP 04-05-2016   Chronic pain syndrome    History of kidney stones    Hyperlipidemia    Hypertension    Left ureteral stone    OA (osteoarthritis)    back, hip   Pain management    Type 2 diabetes mellitus (HCC)    Past Surgical History:  Procedure Laterality Date   TOTAL HIP ARTHROPLASTY Right 04-22-2011   There are no problems to display for this patient.   PCP: Johny Blamer, MD REFERRING PROVIDER: Doneen Poisson    SUBJECTIVE STATEMENT: Patient reports that for a few days he felt a little better and a little straighter. He became sick though and has not done too much. He is sore today.   PERTINENT HISTORY:  Anxiety, Kidney stones, DMII, HTN, Potential mensicus tear on the right    PAIN:  Are you having pain? Yes VAS scale: 5/10 Pain location: Low Back  Pain orientation: Right and Left  PAIN TYPE: aching Pain description: intermittent  Aggravating factors: night time  Relieving factors: time and rest      Today's treatment:    10/06/2021   Pt seen for aquatic therapy today.  Treatment took place in water 3.25-4 ft in depth at the [facility] pool. Temp of water was 91.  Pt entered/exited the pool via stairs (step through pattern) independently with bilat rail.   Introduction to water. Had patient  stand at different levels so they could feel the different depths of the water    Warm up: heel/toe walking x4 laps across pool chest deep side stepping x4 laps from shallow to deep;  Ambulation with noodle support: long strides, march    Exercises; Slow march x20; hip extension x20; hip abduction x20; Sit to stand x20;     board trunk flexion x10 lateral board rotation x10 each way seated    With neck noddle and foot float; push pull x20; side to side perturbations from the feet x20; press and push x20                          Seated LAQ and March x20 2lbs    Float position: rythmic movement; push and pull;   Manual:  trigger point release to right side.    Pt requires buoyancy for support and to offload joints with strengthening exercises. Viscosity of the water is needed for resistance of strengthening; water current perturbations provides challenge to standing balance unsupported, requiring increased core activation.   10/13/2021   Pt seen for aquatic therapy today.  Treatment took place in water 3.25-4 ft in depth at the [facility] pool.  Temp of water was 91.  Pt entered/exited the pool via stairs (step through pattern) independently with bilat rail.   Introduction to water. Had patient stand at different levels so they could feel the different depths of the water    Warm up: heel/toe walking x4 laps across pool chest deep side stepping x4 laps from shallow to deep;  Ambulation with noodle support: long strides, march    Exercises; Slow march x20; hip extension x20; hip abduction x20; Sit to stand x20;     board trunk flexion x10 lateral board rotation x10 each way seated    With neck noddle and foot float; push pull x20; side to side perturbations from the feet x20; press and push x20                          Seated LAQ and March x20 2lbs    Float position: rythmic movement; push and pull;   Manual:  trigger point release to right side.    Pt requires  buoyancy for support and to offload joints with strengthening exercises. Viscosity of the water is needed for resistance of strengthening; water current perturbations provides challenge to standing balance unsupported, requiring increased core activation.     CLINICAL IMPRESSION:  Patient responded well to the initial pool session so exercises and stretches kept consistent. Therapy performed manual therapy to left lumbar spine. He had no significant pain after treatment. Therapy will progress to weights in the pool next pool visit.    Abnormal gait, decreased activity tolerance, decreased mobility, difficulty walking, decreased ROM, decreased strength, increased fascial restrictions, increased muscle spasms, and pain   cleaning, community activity, yard work, and shopping   Time since onset of injury/illness/exacerbation and 1-2 comorbidities: right knee injury stemming from fall; right hip replacement; multi joint OA    REHAB POTENTIAL: Good   CLINICAL DECISION MAKING: Evolving/moderate complexity increasing flexion and forward rotation   EVALUATION COMPLEXITY: Moderate     GOALS: Goals reviewed with patient? Yes   SHORT TERM GOALS:   STG Name Target Date Goal status  1 Patient will improve standing flexion from 10-5 degrees  Baseline:  10/27/2021 INITIAL  2 Patient will increase right hip flexion strength by 5 lbs  Baseline:  10/27/2021 INITIAL  3 Patient will be independent with basic stretching and self trigger point release program  Baseline: 10/27/2021 INITIAL    LONG TERM GOALS:    LTG Name Target Date Goal status  1 Patient will stand with neutral posture without increased pain or effort in order to perform daily activity  Baseline: 11/24/2021 INITIAL  2 Patient will be independent with a complete HEP including land and pool exercises  Baseline: 11/24/2021 INITIAL  3 Patient will return to golf and sporting activity without a significant increase in pain  Baseline:  11/24/2021 INITIAL    PLAN: PT FREQUENCY: 2x/week   PT DURATION: 8 weeks   PLANNED INTERVENTIONS: Therapeutic exercises, Therapeutic activity, Neuro Muscular re-education, Balance training, Gait training, Patient/Family education, Joint mobilization, Stair training, Aquatic Therapy, Dry Needling, Electrical stimulation, Cryotherapy, Taping, Ultrasound, and Manual therapy   PLAN FOR NEXT SESSION: If in the water work on stretching with movement; seated lengthening of the spine and hip strengthening; If the patient is on land, consider dry needling; soft tissue mobilization; left hip mobilization to improve extension; posterior chain strengthening         Dessie Coma PT DPT  10/13/2021, 12:49 PM

## 2021-10-15 ENCOUNTER — Encounter (HOSPITAL_BASED_OUTPATIENT_CLINIC_OR_DEPARTMENT_OTHER): Payer: Self-pay | Admitting: Physical Therapy

## 2021-10-15 ENCOUNTER — Other Ambulatory Visit: Payer: Self-pay

## 2021-10-15 ENCOUNTER — Ambulatory Visit (HOSPITAL_BASED_OUTPATIENT_CLINIC_OR_DEPARTMENT_OTHER): Payer: 59 | Admitting: Physical Therapy

## 2021-10-15 DIAGNOSIS — G8929 Other chronic pain: Secondary | ICD-10-CM | POA: Diagnosis not present

## 2021-10-15 DIAGNOSIS — Z96641 Presence of right artificial hip joint: Secondary | ICD-10-CM | POA: Diagnosis not present

## 2021-10-15 DIAGNOSIS — R2689 Other abnormalities of gait and mobility: Secondary | ICD-10-CM | POA: Diagnosis not present

## 2021-10-15 DIAGNOSIS — R293 Abnormal posture: Secondary | ICD-10-CM

## 2021-10-15 DIAGNOSIS — M545 Low back pain, unspecified: Secondary | ICD-10-CM | POA: Diagnosis not present

## 2021-10-15 DIAGNOSIS — M5441 Lumbago with sciatica, right side: Secondary | ICD-10-CM

## 2021-10-15 DIAGNOSIS — M419 Scoliosis, unspecified: Secondary | ICD-10-CM | POA: Diagnosis not present

## 2021-10-15 NOTE — Therapy (Signed)
OUTPATIENT PHYSICAL THERAPY TREATMENT NOTE   Patient Name: Brandon Gordon MRN: [id number] DOB:Mar 20, 1971, 50 y.o., male Today's Date: 10/15/2021  PCP: Johny Blamer, MD REFERRING PROVIDER: Johny Blamer, MD   PT End of Session - 10/15/21 1551     Visit Number 4    Number of Visits 16    Date for PT Re-Evaluation 11/25/21    PT Start Time 1434    PT Stop Time 1515    PT Time Calculation (min) 41 min    Activity Tolerance Patient tolerated treatment well    Behavior During Therapy [facility] for tasks assessed/performed             Past Medical History:  Diagnosis Date   Anxiety    At risk for sleep apnea    STOP-BANG= 5      SENT TO PCP 04-05-2016   Chronic pain syndrome    History of kidney stones    Hyperlipidemia    Hypertension    Left ureteral stone    OA (osteoarthritis)    back, hip   Pain management    Type 2 diabetes mellitus (HCC)    Past Surgical History:  Procedure Laterality Date   TOTAL HIP ARTHROPLASTY Right 04-22-2011   There are no problems to display for this patient.  PCP: Johny Blamer, MD REFERRING PROVIDER: Doneen Poisson    SUBJECTIVE STATEMENT: Patient reports pain in his left leg into the groin today. He flet good after the pool visit.     PERTINENT HISTORY:  Anxiety, Kidney stones, DMII, HTN, Potential mensicus tear on the right    PAIN:  Are you having pain? Yes VAS scale: 5/10 Pain location: Low Back  Pain orientation: Right and Left  PAIN TYPE: aching Pain description: intermittent  Aggravating factors: night time  Relieving factors: time and rest      Today's treatment:    10/06/2021   Pt seen for aquatic therapy today.  Treatment took place in water 3.25-4 ft in depth at the [facility] pool. Temp of water was 91.  Pt entered/exited the pool via stairs (step through pattern) independently with bilat rail.   Introduction to water. Had patient stand at different levels so they could feel the different  depths of the water    Warm up: heel/toe walking x4 laps across pool chest deep side stepping x4 laps from shallow to deep;  Ambulation with noodle support: long strides, march    Exercises; Slow march x20; hip extension x20; hip abduction x20; Sit to stand x20;     board trunk flexion x10 lateral board rotation x10 each way seated    With neck noddle and foot float; push pull x20; side to side perturbations from the feet x20; press and push x20                          Seated LAQ and March x20 2lbs    Float position: rythmic movement; push and pull;   Manual:  trigger point release to right side.    Pt requires buoyancy for support and to offload joints with strengthening exercises. Viscosity of the water is needed for resistance of strengthening; water current perturbations provides challenge to standing balance unsupported, requiring increased core activation.     10/13/2021   Pt seen for aquatic therapy today.  Treatment took place in water 3.25-4 ft in depth at the [facility] pool. Temp of water was 91.  Pt entered/exited the  pool via stairs (step through pattern) independently with bilat rail.   Introduction to water. Had patient stand at different levels so they could feel the different depths of the water    Warm up: heel/toe walking x4 laps across pool chest deep side stepping x4 laps from shallow to deep;  Ambulation with noodle support: long strides, march    Exercises; Slow march x20; hip extension x20; hip abduction x20; Sit to stand x20;     board trunk flexion x10 lateral board rotation x10 each way seated    With neck noddle and foot float; push pull x20; side to side perturbations from the feet x20; press and push x20                          Seated LAQ and March x20 2lbs    Float position: rythmic movement; push and pull;   Manual:  trigger point release to right side.    Pt requires buoyancy for support and to offload joints with strengthening  exercises. Viscosity of the water is needed for resistance of strengthening; water current perturbations provides challenge to standing balance unsupported, requiring increased core activation.    10/28  TPDN: to L2 L3 on the left; 4 spots needled good twitch and palpable increase. Patient gave permission for needling. Information packet given.   Manual therapy: cross hand hip and rib stretch in supine; MFR to QL and lumbar paraspinals. Anterior hip trigger point release and roller to anterior hip; LAD both from above the knee and below; posterior hip mobilization in supine Grade II and III;   Standing: reviewed standing right arm reach for home  Shoulder extension green 2x15 Scap retraction 2x15 green    CLINICAL IMPRESSION:   Patient had more pain into his left groin area today. He had difficult extending his left leg out straight. It will be one of the main gaols manually, to get that leg straight. He reported less pain straightening his leg but he still was not able to straighten it. Therapy reviewed there-ex and a standing stretch exercise to continue with. Therapy will continue to advance as tolerated.    Abnormal gait, decreased activity tolerance, decreased mobility, difficulty walking, decreased ROM, decreased strength, increased fascial restrictions, increased muscle spasms, and pain   cleaning, community activity, yard work, and shopping   Time since onset of injury/illness/exacerbation and 1-2 comorbidities: right knee injury stemming from fall; right hip replacement; multi joint OA    REHAB POTENTIAL: Good   CLINICAL DECISION MAKING: Evolving/moderate complexity increasing flexion and forward rotation   EVALUATION COMPLEXITY: Moderate     GOALS: Goals reviewed with patient? Yes   SHORT TERM GOALS:   STG Name Target Date Goal status  1 Patient will improve standing flexion from 10-5 degrees  Baseline:  10/27/2021 INITIAL  2 Patient will increase right hip flexion  strength by 5 lbs  Baseline:  10/27/2021 INITIAL  3 Patient will be independent with basic stretching and self trigger point release program  Baseline: 10/27/2021 INITIAL    LONG TERM GOALS:    LTG Name Target Date Goal status  1 Patient will stand with neutral posture without increased pain or effort in order to perform daily activity  Baseline: 11/24/2021 INITIAL  2 Patient will be independent with a complete HEP including land and pool exercises  Baseline: 11/24/2021 INITIAL  3 Patient will return to golf and sporting activity without a significant increase in pain  Baseline:  11/24/2021 INITIAL    PLAN: PT FREQUENCY: 2x/week   PT DURATION: 8 weeks   PLANNED INTERVENTIONS: Therapeutic exercises, Therapeutic activity, Neuro Muscular re-education, Balance training, Gait training, Patient/Family education, Joint mobilization, Stair training, Aquatic Therapy, Dry Needling, Electrical stimulation, Cryotherapy, Taping, Ultrasound, and Manual therapy   PLAN FOR NEXT SESSION: If in the water work on stretching with movement; seated lengthening of the spine and hip strengthening; If the patient is on land, consider dry needling; soft tissue mobilization; left hip mobilization to improve extension; posterior chain strengthening         Dessie Coma PT DPT  10/15/2021, 3:54 PM

## 2021-10-16 IMAGING — CR DG CHEST 2V
2 series · 2 of 2 positions shown · non-contrast
Comparison: July 11, 2017.  Report of CT scan July 20, 1999.

CLINICAL DATA: Chest pain.

EXAM:
CHEST - 2 VIEW

[w chest pa]
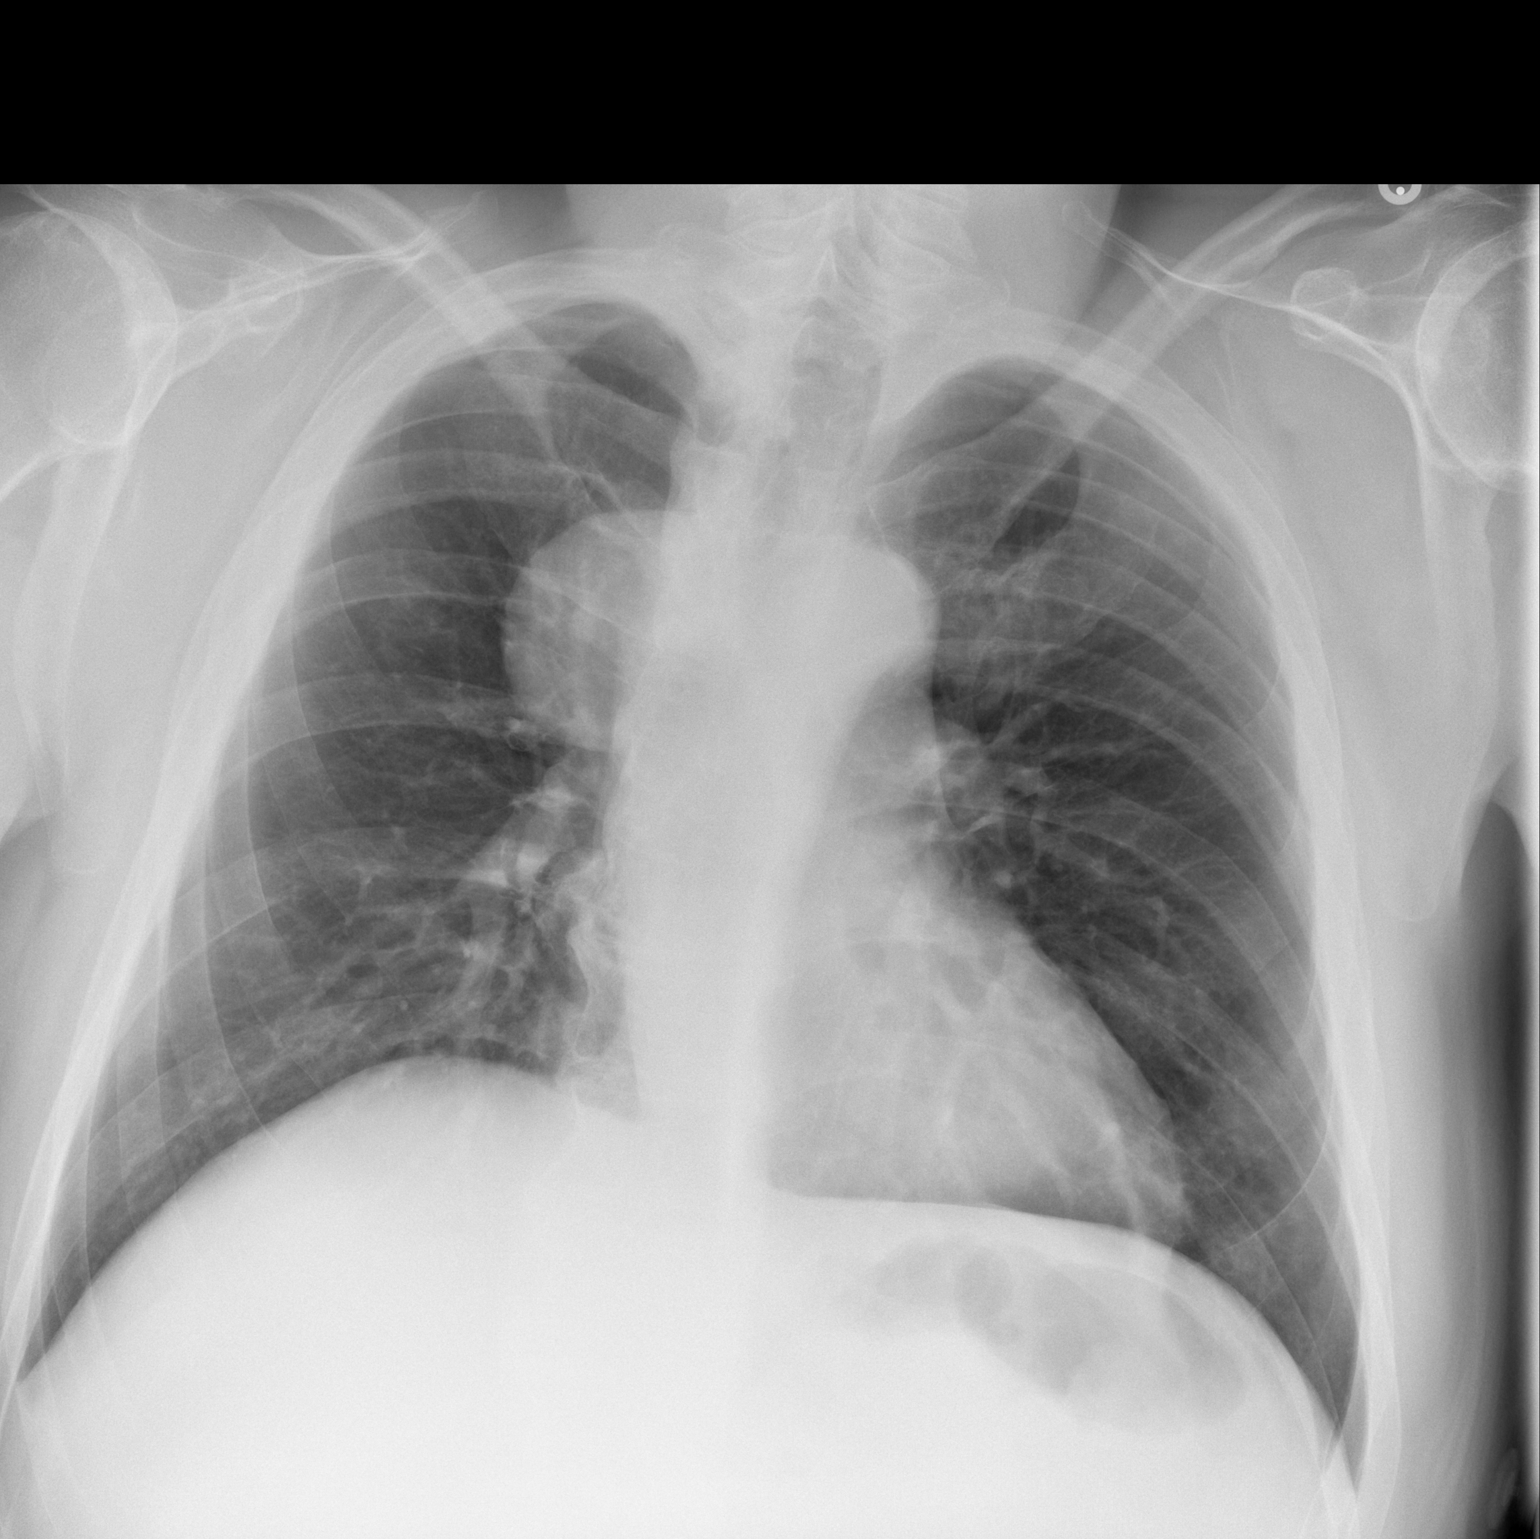

[w chest lat]
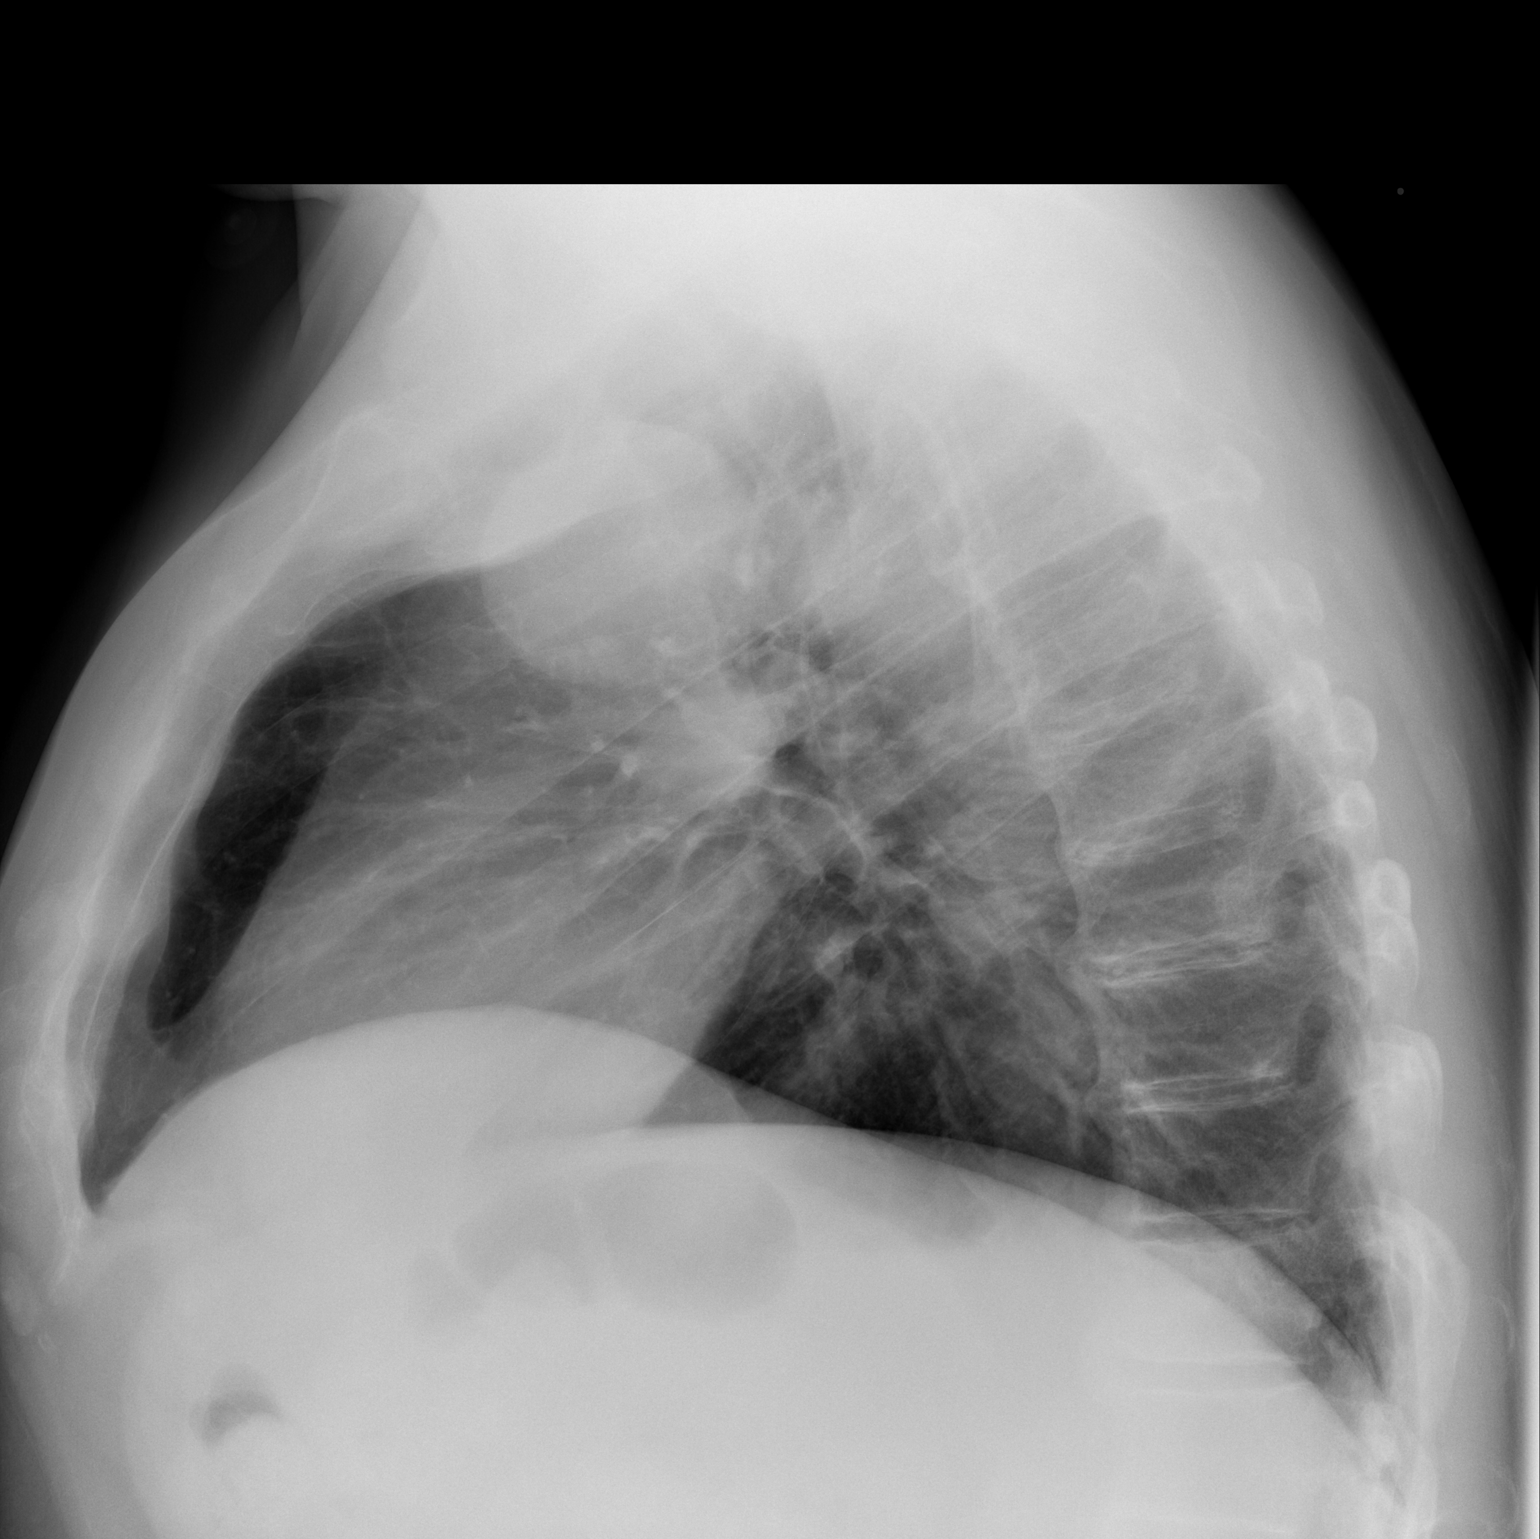

[2 of 2 positions shown; findings below may reference images not displayed]

FINDINGS: Normal cardiac size. Stable rounded right paratracheal prominence is
noted compared to prior exam of 7898, reportedly representing
calcified right paratracheal lymph nodes as described on previous CT
report. No pneumothorax or pleural effusion is noted. Lungs are
clear. Bony thorax is unremarkable.
IMPRESSION: No active cardiopulmonary disease.

## 2021-10-19 ENCOUNTER — Other Ambulatory Visit: Payer: Self-pay

## 2021-10-19 ENCOUNTER — Ambulatory Visit (HOSPITAL_BASED_OUTPATIENT_CLINIC_OR_DEPARTMENT_OTHER): Payer: 59 | Attending: Orthopaedic Surgery | Admitting: Physical Therapy

## 2021-10-19 DIAGNOSIS — G8929 Other chronic pain: Secondary | ICD-10-CM | POA: Diagnosis not present

## 2021-10-19 DIAGNOSIS — R2689 Other abnormalities of gait and mobility: Secondary | ICD-10-CM | POA: Diagnosis not present

## 2021-10-19 DIAGNOSIS — M5441 Lumbago with sciatica, right side: Secondary | ICD-10-CM | POA: Insufficient documentation

## 2021-10-19 DIAGNOSIS — R293 Abnormal posture: Secondary | ICD-10-CM | POA: Diagnosis not present

## 2021-10-20 ENCOUNTER — Encounter (HOSPITAL_BASED_OUTPATIENT_CLINIC_OR_DEPARTMENT_OTHER): Payer: Self-pay | Admitting: Physical Therapy

## 2021-10-20 NOTE — Therapy (Signed)
OUTPATIENT PHYSICAL THERAPY TREATMENT NOTE   Patient Name: Brandon Gordon MRN: [id number] DOB:1971-03-28, 50 y.o., male Today's Date: 10/20/2021  PCP: Shirline Frees, MD REFERRING PROVIDER: Shirline Frees, MD   PT End of Session - 10/20/21 0800     Visit Number 5    Number of Visits 16    Date for PT Re-Evaluation 11/25/21    PT Start Time 1600    PT Stop Time 1644    PT Time Calculation (min) 44 min    Activity Tolerance Patient tolerated treatment well    Behavior During Therapy [facility] for tasks assessed/performed             Past Medical History:  Diagnosis Date   Anxiety    At risk for sleep apnea    STOP-BANG= 5      SENT TO PCP 04-05-2016   Chronic pain syndrome    History of kidney stones    Hyperlipidemia    Hypertension    Left ureteral stone    OA (osteoarthritis)    back, hip   Pain management    Type 2 diabetes mellitus ([place])    Past Surgical History:  Procedure Laterality Date   TOTAL HIP ARTHROPLASTY Right 04-22-2011   There are no problems to display for this patient.  There are no problems to display for this patient.   PCP: Shirline Frees, MD REFERRING PROVIDER: Jean Rosenthal    SUBJECTIVE STATEMENT: Patient reports that after the needling he felt a decrease in pain for several days. He     PERTINENT HISTORY:  Anxiety, Kidney stones, DMII, HTN, Potential mensicus tear on the right    PAIN:  Are you having pain? Yes VAS scale: 5/10 Pain location: Low Back  Pain orientation: Right and Left  PAIN TYPE: aching Pain description: intermittent  Aggravating factors: night time  Relieving factors: time and rest      Today's treatment:    .    Pt requires buoyancy for support and to offload joints with strengthening exercises. Viscosity of the water is needed for resistance of strengthening; water current perturbations provides challenge to standing balance unsupported, requiring increased core activation.     10/13/2021   Pt  seen for aquatic therapy today.  Treatment took place in water 3.25-4 ft in depth at the [facility] pool. Temp of water was 91.  Pt entered/exited the pool via stairs (step through pattern) independently with bilat rail.   Introduction to water. Had patient stand at different levels so they could feel the different depths of the water    Warm up: heel/toe walking x4 laps across pool chest deep side stepping x4 laps from shallow to deep;  Ambulation with noodle support: long strides, march    Exercises; Slow march x20; hip extension x20; hip abduction x20; Sit to stand x20;     board trunk flexion x10 lateral board rotation x10 each way seated    With neck noddle and foot float; push pull x20; side to side perturbations from the feet x20; press and push x20                          Seated LAQ and March x20 2lbs    Float position: rythmic movement; push and pull;   Manual:  trigger point release to right side.    Pt requires buoyancy for support and to offload joints with strengthening exercises. Viscosity of the water is needed for resistance  of strengthening; water current perturbations provides challenge to standing balance unsupported, requiring increased core activation.    10/28   TPDN: to L2 L3 on the left; 4 spots needled good twitch and palpable increase. Patient gave permission for needling. Information packet given.    Manual therapy: cross hand hip and rib stretch in supine; MFR to QL and lumbar paraspinals. Anterior hip trigger point release and roller to anterior hip; LAD both from above the knee and below; posterior hip mobilization in supine Grade II and III;    Standing: reviewed standing right arm reach for home  Shoulder extension green 2x15 Scap retraction 2x15 green     10/19/2021   Pt seen for aquatic therapy today.  Treatment took place in water 3.25-4 ft in depth at the [facility] pool. Temp of water was 91.  Pt entered/exited the pool via  stairs (step through pattern) independently with bilat rail.   Introduction to water. Had patient stand at different levels so they could feel the different depths of the water    Warm up: heel/toe walking x4 laps across pool chest deep side stepping x4 laps from shallow to deep;  Ambulation with noodle support: long strides, march    Exercises; Slow march x20; hip extension x20; hip abduction x20; Sit to stand x20;     board trunk flexion x10 lateral board rotation x10 each way seated    With neck noddle and foot float; push pull x20; side to side perturbations from the feet x20; press and push x20                          Seated LAQ and March x20 2lbs    Float position: rythmic movement; push and pull;   Manual:  trigger point release to right side.    CLINICAL IMPRESSION:   Patient is making progress. After treatment he was able to stand straighter. We will continue to focus on strengthening in the pool. He continues to have a large trigger point on the right side of his paraspinals. Therapy applied pressure to that area.    Abnormal gait, decreased activity tolerance, decreased mobility, difficulty walking, decreased ROM, decreased strength, increased fascial restrictions, increased muscle spasms, and pain   cleaning, community activity, yard work, and shopping   Time since onset of injury/illness/exacerbation and 1-2 comorbidities: right knee injury stemming from fall; right hip replacement; multi joint OA    REHAB POTENTIAL: Good   CLINICAL DECISION MAKING: Evolving/moderate complexity increasing flexion and forward rotation   EVALUATION COMPLEXITY: Moderate     GOALS: Goals reviewed with patient? Yes   SHORT TERM GOALS:   STG Name Target Date Goal status  1 Patient will improve standing flexion from 10-5 degrees  Baseline:  10/27/2021 INITIAL  2 Patient will increase right hip flexion strength by 5 lbs  Baseline:  10/27/2021 INITIAL  3 Patient will be independent  with basic stretching and self trigger point release program  Baseline: 10/27/2021 INITIAL    LONG TERM GOALS:    LTG Name Target Date Goal status  1 Patient will stand with neutral posture without increased pain or effort in order to perform daily activity  Baseline: 11/24/2021 INITIAL  2 Patient will be independent with a complete HEP including land and pool exercises  Baseline: 11/24/2021 INITIAL  3 Patient will return to golf and sporting activity without a significant increase in pain  Baseline: 11/24/2021 INITIAL    PLAN: PT  FREQUENCY: 2x/week   PT DURATION: 8 weeks   PLANNED INTERVENTIONS: Therapeutic exercises, Therapeutic activity, Neuro Muscular re-education, Balance training, Gait training, Patient/Family education, Joint mobilization, Stair training, Aquatic Therapy, Dry Needling, Electrical stimulation, Cryotherapy, Taping, Ultrasound, and Manual therapy   PLAN FOR NEXT SESSION: If in the water work on stretching with movement; seated lengthening of the spine and hip strengthening; If the patient is on land, consider dry needling; soft tissue mobilization; left hip mobilization to improve extension; posterior chain strengthening       Carney Living PT DPT  10/20/2021, 8:07 AM

## 2021-10-22 ENCOUNTER — Encounter (HOSPITAL_BASED_OUTPATIENT_CLINIC_OR_DEPARTMENT_OTHER): Payer: Self-pay

## 2021-10-22 ENCOUNTER — Other Ambulatory Visit (HOSPITAL_COMMUNITY): Payer: Self-pay

## 2021-10-22 ENCOUNTER — Ambulatory Visit (HOSPITAL_BASED_OUTPATIENT_CLINIC_OR_DEPARTMENT_OTHER): Payer: 59 | Admitting: Physical Therapy

## 2021-10-22 DIAGNOSIS — R2689 Other abnormalities of gait and mobility: Secondary | ICD-10-CM

## 2021-10-22 DIAGNOSIS — F1120 Opioid dependence, uncomplicated: Secondary | ICD-10-CM | POA: Diagnosis not present

## 2021-10-22 DIAGNOSIS — Z79899 Other long term (current) drug therapy: Secondary | ICD-10-CM | POA: Diagnosis not present

## 2021-10-22 DIAGNOSIS — R293 Abnormal posture: Secondary | ICD-10-CM | POA: Diagnosis not present

## 2021-10-22 DIAGNOSIS — Z6831 Body mass index (BMI) 31.0-31.9, adult: Secondary | ICD-10-CM | POA: Diagnosis not present

## 2021-10-22 DIAGNOSIS — Z79891 Long term (current) use of opiate analgesic: Secondary | ICD-10-CM | POA: Diagnosis not present

## 2021-10-22 DIAGNOSIS — I10 Essential (primary) hypertension: Secondary | ICD-10-CM | POA: Diagnosis not present

## 2021-10-22 DIAGNOSIS — G8929 Other chronic pain: Secondary | ICD-10-CM

## 2021-10-22 DIAGNOSIS — M5441 Lumbago with sciatica, right side: Secondary | ICD-10-CM | POA: Diagnosis not present

## 2021-10-22 MED ORDER — BUPRENORPHINE HCL-NALOXONE HCL 8-2 MG SL FILM
1.0000 | ORAL_FILM | Freq: Two times a day (BID) | SUBLINGUAL | 0 refills | Status: DC
  Filled 2021-10-22: qty 60, 30d supply, fill #0

## 2021-10-24 ENCOUNTER — Encounter (HOSPITAL_BASED_OUTPATIENT_CLINIC_OR_DEPARTMENT_OTHER): Payer: Self-pay | Admitting: Physical Therapy

## 2021-10-24 ENCOUNTER — Other Ambulatory Visit: Payer: Self-pay

## 2021-10-24 NOTE — Progress Notes (Signed)
OUTPATIENT PHYSICAL THERAPY TREATMENT NOTE   Patient Name: Brandon Gordon MRN: [id number] DOB:[birth year], 50 y.o., male Today's Date: 10/24/2021  PCP: Johny Blamer, MD REFERRING PROVIDER: Johny Blamer, MD  PCP: Johny Blamer, MD REFERRING PROVIDER: Doneen Poisson    SUBJECTIVE STATEMENT: Pateint continues to report a significant improvement  in pain following needling. He has been working on his exercises    PERTINENT HISTORY:  Anxiety, Kidney stones, DMII, HTN, Potential mensicus tear on the right    PAIN:  Are you having pain? Yes VAS scale: 5/10 Pain location: Low Back  Pain orientation: Right and Left  PAIN TYPE: aching Pain description: intermittent  Aggravating factors: night time  Relieving factors: time and rest      Today's treatment:    .    Pt requires buoyancy for support and to offload joints with strengthening exercises. Viscosity of the water is needed for resistance of strengthening; water current perturbations provides challenge to standing balance unsupported, requiring increased core activation.     .    10/28   TPDN: to L2 L3 on the left; 4 spots needled good twitch and palpable increase. Patient gave permission for needling. Information packet given.    Manual therapy: cross hand hip and rib stretch in supine; MFR to QL and lumbar paraspinals. Anterior hip trigger point release and roller to anterior hip; LAD both from above the knee and below; posterior hip mobilization in supine Grade II and III;    Standing: reviewed standing right arm reach for home  Shoulder extension green 2x15 Scap retraction 2x15 green      10/19/2021   Pt seen for aquatic therapy today.  Treatment took place in water 3.25-4 ft in depth at the [facility] pool. Temp of water was 91.  Pt entered/exited the pool via stairs (step through pattern) independently with bilat rail.   Introduction to water. Had patient stand at different levels so they  could feel the different depths of the water    Warm up: heel/toe walking x4 laps across pool chest deep side stepping x4 laps from shallow to deep;  Ambulation with noodle support: long strides, march    Exercises; Slow march x20; hip extension x20; hip abduction x20; Sit to stand x20;     board trunk flexion x10 lateral board rotation x10 each way seated    With neck noddle and foot float; push pull x20; side to side perturbations from the feet x20; press and push x20                          Seated LAQ and March x20 2lbs    Float position: rythmic movement; push and pull;     Manual:  trigger point release to right side.    11/4 Manual therapy: cross hand hip and rib stretch in supine; MFR to QL and lumbar paraspinals. Anterior hip trigger point release and roller to anterior hip; LAD both from above the knee and below; posterior hip mobilization in supine Grade II and III;     CLINICAL IMPRESSION:   Patient was limited by time today. He was 25 minutes late to his appointment. Therapy was able to perform manual therapy to his back. He will work on his exercises at home. We will dry needle his back next week   Abnormal gait, decreased activity tolerance, decreased mobility, difficulty walking, decreased ROM, decreased strength, increased fascial restrictions, increased muscle spasms, and pain   cleaning,  community activity, yard work, and shopping   Time since onset of injury/illness/exacerbation and 1-2 comorbidities: right knee injury stemming from fall; right hip replacement; multi joint OA    REHAB POTENTIAL: Good   CLINICAL DECISION MAKING: Evolving/moderate complexity increasing flexion and forward rotation   EVALUATION COMPLEXITY: Moderate     GOALS: Goals reviewed with patient? Yes   SHORT TERM GOALS:   STG Name Target Date Goal status  1 Patient will improve standing flexion from 10-5 degrees  Baseline:  10/27/2021 INITIAL  2 Patient will increase right hip  flexion strength by 5 lbs  Baseline:  10/27/2021 INITIAL  3 Patient will be independent with basic stretching and self trigger point release program  Baseline: 10/27/2021 INITIAL    LONG TERM GOALS:    LTG Name Target Date Goal status  1 Patient will stand with neutral posture without increased pain or effort in order to perform daily activity  Baseline: 11/24/2021 INITIAL  2 Patient will be independent with a complete HEP including land and pool exercises  Baseline: 11/24/2021 INITIAL  3 Patient will return to golf and sporting activity without a significant increase in pain  Baseline: 11/24/2021 INITIAL    PLAN: PT FREQUENCY: 2x/week   PT DURATION: 8 weeks   PLANNED INTERVENTIONS: Therapeutic exercises, Therapeutic activity, Neuro Muscular re-education, Balance training, Gait training, Patient/Family education, Joint mobilization, Stair training, Aquatic Therapy, Dry Needling, Electrical stimulation, Cryotherapy, Taping, Ultrasound, and Manual therapy   PLAN FOR NEXT SESSION: If in the water work on stretching with movement; seated lengthening of the spine and hip strengthening; If the patient is on land, consider dry needling; soft tissue mobilization; left hip mobilization to improve extension; posterior chain strengthening     PT End of Session - 10/24/21 1834     Visit Number 6    Number of Visits 16    Date for PT Re-Evaluation 11/25/21    PT Start Time 1540   Patient 25 minutes late   PT Stop Time 1600    PT Time Calculation (min) 20 min    Activity Tolerance Patient tolerated treatment well    Behavior During Therapy WFL for tasks assessed/performed             Past Medical History:  Diagnosis Date   Anxiety    At risk for sleep apnea    STOP-BANG= 5      SENT TO PCP 04-05-2016   Chronic pain syndrome    History of kidney stones    Hyperlipidemia    Hypertension    Left ureteral stone    OA (osteoarthritis)    back, hip   Pain management    Type 2 diabetes  mellitus ([place])    Past Surgical History:  Procedure Laterality Date   TOTAL HIP ARTHROPLASTY Right 04-22-2011   There are no problems to display for this patient.  Carney Living Pt DPT                             pT 10/24/2021, 6:40 PM

## 2021-10-26 ENCOUNTER — Other Ambulatory Visit: Payer: Self-pay

## 2021-10-26 ENCOUNTER — Ambulatory Visit (HOSPITAL_BASED_OUTPATIENT_CLINIC_OR_DEPARTMENT_OTHER): Payer: 59 | Admitting: Physical Therapy

## 2021-10-26 DIAGNOSIS — R2689 Other abnormalities of gait and mobility: Secondary | ICD-10-CM | POA: Diagnosis not present

## 2021-10-26 DIAGNOSIS — G8929 Other chronic pain: Secondary | ICD-10-CM

## 2021-10-26 DIAGNOSIS — Z79899 Other long term (current) drug therapy: Secondary | ICD-10-CM | POA: Diagnosis not present

## 2021-10-26 DIAGNOSIS — R293 Abnormal posture: Secondary | ICD-10-CM

## 2021-10-26 DIAGNOSIS — M5441 Lumbago with sciatica, right side: Secondary | ICD-10-CM | POA: Diagnosis not present

## 2021-10-26 NOTE — Therapy (Signed)
OUTPATIENT PHYSICAL THERAPY TREATMENT NOTE   Patient Name: Brandon Gordon MRN: [id number] DOB:July 30, 1971, 50 y.o., male Today's Date: 10/27/2021  Visit Number 6     Number of Visits 16     Date for PT Re-Evaluation 11/25/21     PT Start Time 1600     PT Stop Time [id number]    PT Time Calculation (min) 43 min     Activity Tolerance Patient tolerated treatment well     Behavior During Therapy [facility] for tasks assessed/performed       Patient Name: Brandon Gordon MRN: [id number] DOB:1971-10-05, 50 y.o., male Today's Date: 10/24/2021   PCP: Shirline Frees, MD REFERRING PROVIDER: Shirline Frees, MD   PCP: Shirline Frees, MD REFERRING PROVIDER: Jean Rosenthal    SUBJECTIVE STATEMENT: Patient reports his pain when he is just resting has been significantly better. He continues to have some pain when he is up and moving but it is mild.  PERTINENT HISTORY:  Anxiety, Kidney stones, DMII, HTN, Potential mensicus tear on the right    PAIN:  Are you having pain? Yes VAS scale: 4/10 Pain location: Low Back  Pain orientation: Right and Left  PAIN TYPE: aching Pain description: intermittent  Aggravating factors: night time  Relieving factors: time and rest      Today's treatment:    .    Pt requires buoyancy for support and to offload joints with strengthening exercises. Viscosity of the water is needed for resistance of strengthening; water current perturbations provides challenge to standing balance unsupported, requiring increased core activation.     .    10/28   TPDN: to L2 L3 on the left; 4 spots needled good twitch and palpable increase. Patient gave permission for needling. Information packet given.    Manual therapy: cross hand hip and rib stretch in supine; MFR to QL and lumbar paraspinals. Anterior hip trigger point release and roller to anterior hip; LAD both from above the knee and below; posterior hip mobilization in supine Grade II and III;    Standing: reviewed  standing right arm reach for home  Shoulder extension green 2x15 Scap retraction 2x15 green      10/19/2021   Pt seen for aquatic therapy today.  Treatment took place in water 3.25-4 ft in depth at the [facility] pool. Temp of water was 91.  Pt entered/exited the pool via stairs (step through pattern) independently with bilat rail.   Introduction to water. Had patient stand at different levels so they could feel the different depths of the water    Warm up: heel/toe walking x4 laps across pool chest deep side stepping x4 laps from shallow to deep;  Ambulation with noodle support: long strides, march    Exercises; Slow march x20; hip extension x20; hip abduction x20; Sit to stand x20;     board trunk flexion x10 lateral board rotation x10 each way seated    With neck noddle and foot float; push pull x20; side to side perturbations from the feet x20; press and push x20                          Seated LAQ and March x20 2lbs    Float position: rythmic movement; push and pull;       Manual:  trigger point release to right side.    11/4 Manual therapy: cross hand hip and rib stretch in supine; MFR to QL and lumbar paraspinals. Anterior hip  trigger point release and roller to anterior hip; LAD both from above the knee and below; posterior hip mobilization in supine Grade II and III;    10/27/2021   Pt seen for aquatic therapy today.  Treatment took place in water 3.25-4 ft in depth at the [facility] pool. Temp of water was 91.  Pt entered/exited the pool via stairs (step through pattern) independently with bilat rail.   Introduction to water. Had patient stand at different levels so they could feel the different depths of the water    Warm up: heel/toe walking x4 laps across pool chest deep side stepping x4 laps from shallow to deep;  Ambulation with noodle support: long strides, march    Exercises; Slow march x20; hip extension x20; hip abduction x20; Sit to  stand x20;    Weight push and pull 2x20 yellow    board trunk flexion x10 lateral board rotation x10 each way seated    With neck noddle and foot float; push pull x20; side to side perturbations from the feet x20; press and push x20                          Seated LAQ and March x20 2lbs    Float position: rythmic movement; push and pull;       Manual:  trigger point release to right side.     CLINICAL IMPRESSION:   Patient tolerated treatment well today. He continues to do well with pool exercises. Therapy continues to work on manual trigger point release in the pool. The patient is able to tolerate increased pressure with manual therapy. PT will continue to progress as tolerated.     Abnormal gait, decreased activity tolerance, decreased mobility, difficulty walking, decreased ROM, decreased strength, increased fascial restrictions, increased muscle spasms, and pain   cleaning, community activity, yard work, and shopping   Time since onset of injury/illness/exacerbation and 1-2 comorbidities: right knee injury stemming from fall; right hip replacement; multi joint OA    REHAB POTENTIAL: Good   CLINICAL DECISION MAKING: Evolving/moderate complexity increasing flexion and forward rotation   EVALUATION COMPLEXITY: Moderate     GOALS: Goals reviewed with patient? Yes   SHORT TERM GOALS:   STG Name Target Date Goal status  1 Patient will improve standing flexion from 10-5 degrees  Baseline:  10/27/2021 INITIAL  2 Patient will increase right hip flexion strength by 5 lbs  Baseline:  10/27/2021 INITIAL  3 Patient will be independent with basic stretching and self trigger point release program  Baseline: 10/27/2021 INITIAL    LONG TERM GOALS:    LTG Name Target Date Goal status  1 Patient will stand with neutral posture without increased pain or effort in order to perform daily activity  Baseline: 11/24/2021 INITIAL  2 Patient will be independent with a complete HEP including  land and pool exercises  Baseline: 11/24/2021 INITIAL  3 Patient will return to golf and sporting activity without a significant increase in pain  Baseline: 11/24/2021 INITIAL    PLAN: PT FREQUENCY: 2x/week   PT DURATION: 8 weeks   PLANNED INTERVENTIONS: Therapeutic exercises, Therapeutic activity, Neuro Muscular re-education, Balance training, Gait training, Patient/Family education, Joint mobilization, Stair training, Aquatic Therapy, Dry Needling, Electrical stimulation, Cryotherapy, Taping, Ultrasound, and Manual therapy   PLAN FOR NEXT SESSION: If in the water work on stretching with movement; seated lengthening of the spine and hip strengthening; If the patient is on land, consider dry  needling; soft tissue mobilization; left hip mobilization to improve extension; posterior chain strengthening        PT End of Session - 10/27/21 1708     Visit Number 7    Number of Visits 16    Date for PT Re-Evaluation 11/25/21    PT Start Time 1600    PT Stop Time 1643    PT Time Calculation (min) 43 min    Activity Tolerance Patient tolerated treatment well    Behavior During Therapy WFL for tasks assessed/performed             Past Medical History:  Diagnosis Date   Anxiety    At risk for sleep apnea    STOP-BANG= 5      SENT TO PCP 04-05-2016   Chronic pain syndrome    History of kidney stones    Hyperlipidemia    Hypertension    Left ureteral stone    OA (osteoarthritis)    back, hip   Pain management    Type 2 diabetes mellitus ([facility])    Past Surgical History:  Procedure Laterality Date   TOTAL HIP ARTHROPLASTY Right 04-22-2011   There are no problems to display for this patient.    Dessie Coma PT DPT  10/27/2021, 5:10 PM

## 2021-10-27 ENCOUNTER — Encounter (HOSPITAL_BASED_OUTPATIENT_CLINIC_OR_DEPARTMENT_OTHER): Payer: Self-pay | Admitting: Physical Therapy

## 2021-10-29 ENCOUNTER — Ambulatory Visit (HOSPITAL_BASED_OUTPATIENT_CLINIC_OR_DEPARTMENT_OTHER): Payer: 59 | Admitting: Physical Therapy

## 2021-11-02 ENCOUNTER — Other Ambulatory Visit: Payer: Self-pay

## 2021-11-02 ENCOUNTER — Ambulatory Visit (HOSPITAL_BASED_OUTPATIENT_CLINIC_OR_DEPARTMENT_OTHER): Payer: 59 | Attending: Orthopaedic Surgery | Admitting: Physical Therapy

## 2021-11-02 DIAGNOSIS — G8929 Other chronic pain: Secondary | ICD-10-CM | POA: Insufficient documentation

## 2021-11-02 DIAGNOSIS — R2689 Other abnormalities of gait and mobility: Secondary | ICD-10-CM | POA: Insufficient documentation

## 2021-11-02 DIAGNOSIS — R293 Abnormal posture: Secondary | ICD-10-CM | POA: Insufficient documentation

## 2021-11-02 DIAGNOSIS — M5441 Lumbago with sciatica, right side: Secondary | ICD-10-CM | POA: Diagnosis not present

## 2021-11-03 ENCOUNTER — Encounter (HOSPITAL_BASED_OUTPATIENT_CLINIC_OR_DEPARTMENT_OTHER): Payer: Self-pay | Admitting: Physical Therapy

## 2021-11-03 NOTE — Therapy (Signed)
OUTPATIENT PHYSICAL THERAPY TREATMENT NOTE   Patient Name: Brandon Gordon MRN: [id number] DOB:1971-02-20, 50 y.o., male Today's Date: 11/03/2021  PCP: Shirline Frees, MD REFERRING PROVIDER: Mcarthur Rossetti*   PT End of Session - 11/03/21 1137     Visit Number 8    Number of Visits 16    Date for PT Re-Evaluation 11/25/21    PT Start Time 1600    PT Stop Time 1644    PT Time Calculation (min) 44 min    Activity Tolerance Patient tolerated treatment well    Behavior During Therapy [facility] for tasks assessed/performed             Past Medical History:  Diagnosis Date   Anxiety    At risk for sleep apnea    STOP-BANG= 5      SENT TO PCP 04-05-2016   Chronic pain syndrome    History of kidney stones    Hyperlipidemia    Hypertension    Left ureteral stone    OA (osteoarthritis)    back, hip   Pain management    Type 2 diabetes mellitus ([place])    Past Surgical History:  Procedure Laterality Date   TOTAL HIP ARTHROPLASTY Right 04-22-2011   There are no problems to display for this patient. PCP: Shirline Frees, MD REFERRING PROVIDER: Shirline Frees, MD   PCP: Shirline Frees, MD REFERRING PROVIDER: Jean Rosenthal    SUBJECTIVE STATEMENT: Patient reports his pain when he is just resting has been significantly better. He continues to have some pain when he is up and moving but it is mild.  PERTINENT HISTORY:  Anxiety, Kidney stones, DMII, HTN, Potential mensicus tear on the right    PAIN:  Are you having pain? Yes VAS scale: 4/10 Pain location: Low Back  Pain orientation: Right and Left  PAIN TYPE: aching Pain description: intermittent  Aggravating factors: night time  Relieving factors: time and rest      Today's treatment:    .    Pt requires buoyancy for support and to offload joints with strengthening exercises. Viscosity of the water is needed for resistance of strengthening; water current perturbations provides challenge to standing  balance unsupported, requiring increased core activation.     .    10/28   TPDN: to L2 L3 on the left; 4 spots needled good twitch and palpable increase. Patient gave permission for needling. Information packet given.    Manual therapy: cross hand hip and rib stretch in supine; MFR to QL and lumbar paraspinals. Anterior hip trigger point release and roller to anterior hip; LAD both from above the knee and below; posterior hip mobilization in supine Grade II and III;    Standing: reviewed standing right arm reach for home  Shoulder extension green 2x15 Scap retraction 2x15 green      10/19/2021   Pt seen for aquatic therapy today.  Treatment took place in water 3.25-4 ft in depth at the [facility] pool. Temp of water was 91.  Pt entered/exited the pool via stairs (step through pattern) independently with bilat rail.   Introduction to water. Had patient stand at different levels so they could feel the different depths of the water    Warm up: heel/toe walking x4 laps across pool chest deep side stepping x4 laps from shallow to deep;  Ambulation with noodle support: long strides, march    Exercises; Slow march x20; hip extension x20; hip abduction x20; Sit to stand x20;  board trunk flexion x10 lateral board rotation x10 each way seated    With neck noddle and foot float; push pull x20; side to side perturbations from the feet x20; press and push x20                          Seated LAQ and March x20 2lbs    Float position: rythmic movement; push and pull;       Manual:  trigger point release to right side.    11/4 Manual therapy: cross hand hip and rib stretch in supine; MFR to QL and lumbar paraspinals. Anterior hip trigger point release and roller to anterior hip; LAD both from above the knee and below; posterior hip mobilization in supine Grade II and III;    10/27/2021   Pt seen for aquatic therapy today.  Treatment took place in water 3.25-4 ft in depth at  the [facility] pool. Temp of water was 91.  Pt entered/exited the pool via stairs (step through pattern) independently with bilat rail.   Introduction to water. Had patient stand at different levels so they could feel the different depths of the water    Warm up: heel/toe walking x4 laps across pool chest deep side stepping x4 laps from shallow to deep;  Ambulation with noodle support: long strides, march    Exercises; Slow march x20; hip extension x20; hip abduction x20; Sit to stand x20;     Weight push and pull 2x20 yellow    board trunk flexion x10 lateral board rotation x10 each way seated    With neck noddle and foot float; push pull x20; side to side perturbations from the feet x20; press and push x20                          Seated LAQ and March x20 2lbs    Float position: rythmic movement; push and pull;    11/16  Pt seen for aquatic therapy today.  Treatment took place in water 3.25-4 ft in depth at the [facility] pool. Temp of water was 91.  Pt entered/exited the pool via stairs (step through pattern) independently with bilat rail.   Introduction to water. Had patient stand at different levels so they could feel the different depths of the water    Warm up: heel/toe walking x4 laps across pool chest deep side stepping x4 laps from shallow to deep;  Ambulation with noodle support: long strides, march    Exercises; Slow march x20; hip extension x20; hip abduction x20; Sit to stand x20;     Weight push and pull 2x20 yellow    board trunk flexion x10 lateral board rotation x10 each way seated    With neck noddle and foot float; push pull x20; side to side perturbations from the feet x20; press and push x20                          Seated LAQ and March x20 2lbs    Float position: rythmic movement; push and pull;  Stp up and lateral step up 2x10 each leg      Manual:  trigger point release to right side and left side .     CLINICAL  IMPRESSION:   Patient continues to tolerate treatment well. He continues to have a large trigger point in his right paraspinals. He is tolerating manual therapy  better though. Overall his pain levels are dropping. He is no longer having consistent pain when he is siting. He has pain when she is standing and walking. He performed steps today with min mal difficulty.     Abnormal gait, decreased activity tolerance, decreased mobility, difficulty walking, decreased ROM, decreased strength, increased fascial restrictions, increased muscle spasms, and pain   cleaning, community activity, yard work, and shopping   Time since onset of injury/illness/exacerbation and 1-2 comorbidities: right knee injury stemming from fall; right hip replacement; multi joint OA    REHAB POTENTIAL: Good   CLINICAL DECISION MAKING: Evolving/moderate complexity increasing flexion and forward rotation   EVALUATION COMPLEXITY: Moderate     GOALS: Goals reviewed with patient? Yes   SHORT TERM GOALS:   STG Name Target Date Goal status  1 Patient will improve standing flexion from 10-5 degrees  Baseline:  10/27/2021 INITIAL  2 Patient will increase right hip flexion strength by 5 lbs  Baseline:  10/27/2021 INITIAL  3 Patient will be independent with basic stretching and self trigger point release program  Baseline: 10/27/2021 INITIAL    LONG TERM GOALS:    LTG Name Target Date Goal status  1 Patient will stand with neutral posture without increased pain or effort in order to perform daily activity  Baseline: 11/24/2021 INITIAL  2 Patient will be independent with a complete HEP including land and pool exercises  Baseline: 11/24/2021 INITIAL  3 Patient will return to golf and sporting activity without a significant increase in pain  Baseline: 11/24/2021 INITIAL    PLAN: PT FREQUENCY: 2x/week   PT DURATION: 8 weeks   PLANNED INTERVENTIONS: Therapeutic exercises, Therapeutic activity, Neuro Muscular re-education,  Balance training, Gait training, Patient/Family education, Joint mobilization, Stair training, Aquatic Therapy, Dry Needling, Electrical stimulation, Cryotherapy, Taping, Ultrasound, and Manual therapy   PLAN FOR NEXT SESSION: If in the water work on stretching with movement; seated lengthening of the spine and hip strengthening; If the patient is on land, consider dry needling; soft tissue mobilization; left hip mobilization to improve extension; posterior chain strengthening        Carney Living PT DPT  11/03/2021, 11:42 AM

## 2021-11-05 ENCOUNTER — Other Ambulatory Visit: Payer: Self-pay

## 2021-11-05 ENCOUNTER — Ambulatory Visit (HOSPITAL_BASED_OUTPATIENT_CLINIC_OR_DEPARTMENT_OTHER): Payer: 59 | Admitting: Physical Therapy

## 2021-11-05 DIAGNOSIS — M5441 Lumbago with sciatica, right side: Secondary | ICD-10-CM

## 2021-11-05 DIAGNOSIS — R293 Abnormal posture: Secondary | ICD-10-CM | POA: Diagnosis not present

## 2021-11-05 DIAGNOSIS — R2689 Other abnormalities of gait and mobility: Secondary | ICD-10-CM | POA: Diagnosis not present

## 2021-11-05 DIAGNOSIS — G8929 Other chronic pain: Secondary | ICD-10-CM | POA: Diagnosis not present

## 2021-11-05 NOTE — Therapy (Signed)
OUTPATIENT PHYSICAL THERAPY TREATMENT NOTE   Patient Name: Brandon Gordon MRN: [id number] DOB:July 13, 1971, 50 y.o., male Today's Date: 11/05/2021  PCP: Shirline Frees, MD REFERRING PROVIDER: Shirline Frees, MD    Past Medical History:  Diagnosis Date   Anxiety    At risk for sleep apnea    STOP-BANG= 5      SENT TO PCP 04-05-2016   Chronic pain syndrome    History of kidney stones    Hyperlipidemia    Hypertension    Left ureteral stone    OA (osteoarthritis)    back, hip   Pain management    Type 2 diabetes mellitus ([place])    Past Surgical History:  Procedure Laterality Date   TOTAL HIP ARTHROPLASTY Right 04-22-2011   There are no problems to display for this patient.    There are no problems to display for this patient. PCP: Shirline Frees, MD REFERRING PROVIDER: Shirline Frees, MD   PCP: Shirline Frees, MD REFERRING PROVIDER: Jean Rosenthal    SUBJECTIVE STATEMENT: Patient continues to report that his pain is improving. He has had very little consistent pain. He continues to have pain when he is standing and walking and performing other activity.  PERTINENT HISTORY:  Anxiety, Kidney stones, DMII, HTN, Potential mensicus tear on the right    PAIN:  Are you having pain? No pain today just pain with activity  VAS scale: 0/10 Pain location: Low Back  Pain orientation: Right and Left  PAIN TYPE: aching Pain description: intermittent  Aggravating factors: night time  Relieving factors: time and rest      Today's treatment:    .    Pt requires buoyancy for support and to offload joints with strengthening exercises. Viscosity of the water is needed for resistance of strengthening; water current perturbations provides challenge to standing balance unsupported, requiring increased core activation.     .    10/28   TPDN: to L2 L3 on the left; 4 spots needled good twitch and palpable increase. Patient gave permission for needling. Information packet  given.    Manual therapy: cross hand hip and rib stretch in supine; MFR to QL and lumbar paraspinals. Anterior hip trigger point release and roller to anterior hip; LAD both from above the knee and below; posterior hip mobilization in supine Grade II and III;    Standing: reviewed standing right arm reach for home  Shoulder extension green 2x15 Scap retraction 2x15 green      10/19/2021   Pt seen for aquatic therapy today.  Treatment took place in water 3.25-4 ft in depth at the [facility] pool. Temp of water was 91.  Pt entered/exited the pool via stairs (step through pattern) independently with bilat rail.   Introduction to water. Had patient stand at different levels so they could feel the different depths of the water    Warm up: heel/toe walking x4 laps across pool chest deep side stepping x4 laps from shallow to deep;  Ambulation with noodle support: long strides, march    Exercises; Slow march x20; hip extension x20; hip abduction x20; Sit to stand x20;     board trunk flexion x10 lateral board rotation x10 each way seated    With neck noddle and foot float; push pull x20; side to side perturbations from the feet x20; press and push x20  Seated LAQ and March x20 2lbs    Float position: rythmic movement; push and pull;       Manual:  trigger point release to right side.    11/4 Manual therapy: cross hand hip and rib stretch in supine; MFR to QL and lumbar paraspinals. Anterior hip trigger point release and roller to anterior hip; LAD both from above the knee and below; posterior hip mobilization in supine Grade II and III;    10/27/2021   Pt seen for aquatic therapy today.  Treatment took place in water 3.25-4 ft in depth at the [facility] pool. Temp of water was 91.  Pt entered/exited the pool via stairs (step through pattern) independently with bilat rail.   Introduction to water. Had patient stand at different levels so they  could feel the different depths of the water    Warm up: heel/toe walking x4 laps across pool chest deep side stepping x4 laps from shallow to deep;  Ambulation with noodle support: long strides, march    Exercises; Slow march x20; hip extension x20; hip abduction x20; Sit to stand x20;     Weight push and pull 2x20 yellow    board trunk flexion x10 lateral board rotation x10 each way seated    With neck noddle and foot float; push pull x20; side to side perturbations from the feet x20; press and push x20                          Seated LAQ and March x20 2lbs    Float position: rythmic movement; push and pull;     11/16   Pt seen for aquatic therapy today.  Treatment took place in water 3.25-4 ft in depth at the [facility] pool. Temp of water was 91.  Pt entered/exited the pool via stairs (step through pattern) independently with bilat rail.   Introduction to water. Had patient stand at different levels so they could feel the different depths of the water    Warm up: heel/toe walking x4 laps across pool chest deep side stepping x4 laps from shallow to deep;  Ambulation with noodle support: long strides, march    Exercises; Slow march x20; hip extension x20; hip abduction x20; Sit to stand x20;     Weight push and pull 2x20 yellow    board trunk flexion x10 lateral board rotation x10 each way seated    With neck noddle and foot float; push pull x20; side to side perturbations from the feet x20; press and push x20                          Seated LAQ and March x20 2lbs    Float position: rythmic movement; push and pull;   Stp up and lateral step up 2x10 each leg      Manual:  trigger point release to right side and left side .   11/18 TPDN: to L2 L3 on the right; 3 spots needled good twitch and palpable increase.  2 spots needled in the left gluteal.  Patient gave permission for needling. Information packet given. Skilled palpation of trigger points and monitoring  during session;  Manual therapy: trigger point release to bilateral glueals and bilateral paraspinals   Anterior hip stretch at wall: reach with cuing not to drive through pain  Scap retraction green x20 with cuing for posture   Shoulder extension x20with cuing for posture.  Seated shoulder flexion 2x10 with wand         CLINICAL IMPRESSION:   Good twitch response achieved with needles to the lumbar paraspinal and the gluteal. Therapy performed trigger point release to lumbar paraspinals and gluteals.  He was given posterior chain strengthening for home. He was also given a seated bilateral shoulder flexion. Therapy reviewed his HEP.  Abnormal gait, decreased activity tolerance, decreased mobility, difficulty walking, decreased ROM, decreased strength, increased fascial restrictions, increased muscle spasms, and pain   cleaning, community activity, yard work, and shopping   Time since onset of injury/illness/exacerbation and 1-2 comorbidities: right knee injury stemming from fall; right hip replacement; multi joint OA    REHAB POTENTIAL: Good   CLINICAL DECISION MAKING: Evolving/moderate complexity increasing flexion and forward rotation   EVALUATION COMPLEXITY: Moderate     GOALS: Goals reviewed with patient? Yes   SHORT TERM GOALS:   STG Name Target Date Goal status  1 Patient will improve standing flexion from 10-5 degrees  Baseline:  10/27/2021 INITIAL  2 Patient will increase right hip flexion strength by 5 lbs  Baseline:  10/27/2021 INITIAL  3 Patient will be independent with basic stretching and self trigger point release program  Baseline: 10/27/2021 INITIAL    LONG TERM GOALS:    LTG Name Target Date Goal status  1 Patient will stand with neutral posture without increased pain or effort in order to perform daily activity  Baseline: 11/24/2021 INITIAL  2 Patient will be independent with a complete HEP including land and pool exercises  Baseline: 11/24/2021  INITIAL  3 Patient will return to golf and sporting activity without a significant increase in pain  Baseline: 11/24/2021 INITIAL    PLAN: PT FREQUENCY: 2x/week   PT DURATION: 8 weeks   PLANNED INTERVENTIONS: Therapeutic exercises, Therapeutic activity, Neuro Muscular re-education, Balance training, Gait training, Patient/Family education, Joint mobilization, Stair training, Aquatic Therapy, Dry Needling, Electrical stimulation, Cryotherapy, Taping, Ultrasound, and Manual therapy   PLAN FOR NEXT SESSION: If in the water work on stretching with movement; seated lengthening of the spine and hip strengthening; If the patient is on land, consider dry needling; soft tissue mobilization; left hip mobilization to improve extension; posterior chain strengthening    HOME EXERCISE PROGRAM: Access Code: [email or web address] URL: [email or web address] Date: 09/29/2021 Prepared by: Lorayne Bender Program Notes Reach up the doorway for a stretch  Exercises Reaching in Sidelying - 1 x daily - 7 x weekly - 3 sets - 10 reps Theracane Over Shoulder - 1 x daily - 7 x weekly - 3 sets - 10 reps      Dessie Coma PT DPT  11/05/2021, 3:23 PM

## 2021-11-07 ENCOUNTER — Encounter (HOSPITAL_BASED_OUTPATIENT_CLINIC_OR_DEPARTMENT_OTHER): Payer: Self-pay | Admitting: Physical Therapy

## 2021-11-10 DIAGNOSIS — E782 Mixed hyperlipidemia: Secondary | ICD-10-CM | POA: Diagnosis not present

## 2021-11-10 DIAGNOSIS — Z23 Encounter for immunization: Secondary | ICD-10-CM | POA: Diagnosis not present

## 2021-11-10 DIAGNOSIS — G609 Hereditary and idiopathic neuropathy, unspecified: Secondary | ICD-10-CM | POA: Diagnosis not present

## 2021-11-10 DIAGNOSIS — E1165 Type 2 diabetes mellitus with hyperglycemia: Secondary | ICD-10-CM | POA: Diagnosis not present

## 2021-11-10 DIAGNOSIS — E291 Testicular hypofunction: Secondary | ICD-10-CM | POA: Diagnosis not present

## 2021-11-10 DIAGNOSIS — I10 Essential (primary) hypertension: Secondary | ICD-10-CM | POA: Diagnosis not present

## 2021-11-10 DIAGNOSIS — F419 Anxiety disorder, unspecified: Secondary | ICD-10-CM | POA: Diagnosis not present

## 2021-11-15 ENCOUNTER — Other Ambulatory Visit (HOSPITAL_COMMUNITY): Payer: Self-pay

## 2021-11-15 MED FILL — Bupropion HCl Tab ER 24HR 300 MG: ORAL | 90 days supply | Qty: 90 | Fill #2 | Status: AC

## 2021-11-17 ENCOUNTER — Other Ambulatory Visit (HOSPITAL_COMMUNITY): Payer: Self-pay

## 2021-11-17 DIAGNOSIS — E291 Testicular hypofunction: Secondary | ICD-10-CM | POA: Diagnosis not present

## 2021-11-17 DIAGNOSIS — E1165 Type 2 diabetes mellitus with hyperglycemia: Secondary | ICD-10-CM | POA: Diagnosis not present

## 2021-11-18 ENCOUNTER — Other Ambulatory Visit (HOSPITAL_COMMUNITY): Payer: Self-pay

## 2021-11-18 DIAGNOSIS — Z79899 Other long term (current) drug therapy: Secondary | ICD-10-CM | POA: Diagnosis not present

## 2021-11-18 DIAGNOSIS — Z79891 Long term (current) use of opiate analgesic: Secondary | ICD-10-CM | POA: Diagnosis not present

## 2021-11-18 DIAGNOSIS — F1120 Opioid dependence, uncomplicated: Secondary | ICD-10-CM | POA: Diagnosis not present

## 2021-11-18 DIAGNOSIS — Z6831 Body mass index (BMI) 31.0-31.9, adult: Secondary | ICD-10-CM | POA: Diagnosis not present

## 2021-11-18 DIAGNOSIS — I10 Essential (primary) hypertension: Secondary | ICD-10-CM | POA: Diagnosis not present

## 2021-11-18 MED ORDER — BUPRENORPHINE HCL-NALOXONE HCL 8-2 MG SL FILM
1.0000 | ORAL_FILM | Freq: Two times a day (BID) | SUBLINGUAL | 0 refills | Status: DC
  Filled 2021-11-18 – 2021-11-22 (×2): qty 60, 30d supply, fill #0

## 2021-11-19 ENCOUNTER — Ambulatory Visit (HOSPITAL_BASED_OUTPATIENT_CLINIC_OR_DEPARTMENT_OTHER): Payer: 59 | Admitting: Physical Therapy

## 2021-11-22 ENCOUNTER — Other Ambulatory Visit (HOSPITAL_COMMUNITY): Payer: Self-pay

## 2021-11-26 ENCOUNTER — Ambulatory Visit (HOSPITAL_BASED_OUTPATIENT_CLINIC_OR_DEPARTMENT_OTHER): Payer: 59 | Attending: Orthopaedic Surgery | Admitting: Physical Therapy

## 2021-11-26 DIAGNOSIS — R293 Abnormal posture: Secondary | ICD-10-CM | POA: Insufficient documentation

## 2021-11-26 DIAGNOSIS — G8929 Other chronic pain: Secondary | ICD-10-CM | POA: Insufficient documentation

## 2021-11-26 DIAGNOSIS — R2689 Other abnormalities of gait and mobility: Secondary | ICD-10-CM | POA: Insufficient documentation

## 2021-11-26 DIAGNOSIS — M5441 Lumbago with sciatica, right side: Secondary | ICD-10-CM | POA: Insufficient documentation

## 2021-11-27 DIAGNOSIS — Z7984 Long term (current) use of oral hypoglycemic drugs: Secondary | ICD-10-CM | POA: Diagnosis not present

## 2021-11-27 DIAGNOSIS — E11628 Type 2 diabetes mellitus with other skin complications: Secondary | ICD-10-CM | POA: Diagnosis not present

## 2021-11-27 DIAGNOSIS — L03119 Cellulitis of unspecified part of limb: Secondary | ICD-10-CM | POA: Diagnosis not present

## 2021-11-30 ENCOUNTER — Ambulatory Visit (HOSPITAL_BASED_OUTPATIENT_CLINIC_OR_DEPARTMENT_OTHER): Payer: 59 | Admitting: Physical Therapy

## 2021-11-30 ENCOUNTER — Other Ambulatory Visit (HOSPITAL_COMMUNITY): Payer: Self-pay

## 2021-11-30 MED ORDER — VILAZODONE HCL 40 MG PO TABS
40.0000 mg | ORAL_TABLET | Freq: Every day | ORAL | 0 refills | Status: DC
  Filled 2021-11-30: qty 90, 90d supply, fill #0

## 2021-12-01 ENCOUNTER — Other Ambulatory Visit (HOSPITAL_COMMUNITY): Payer: Self-pay

## 2021-12-02 ENCOUNTER — Ambulatory Visit (HOSPITAL_BASED_OUTPATIENT_CLINIC_OR_DEPARTMENT_OTHER): Payer: 59 | Admitting: Physical Therapy

## 2021-12-02 DIAGNOSIS — E11628 Type 2 diabetes mellitus with other skin complications: Secondary | ICD-10-CM | POA: Diagnosis not present

## 2021-12-02 DIAGNOSIS — R5081 Fever presenting with conditions classified elsewhere: Secondary | ICD-10-CM | POA: Diagnosis not present

## 2021-12-02 DIAGNOSIS — L03116 Cellulitis of left lower limb: Secondary | ICD-10-CM | POA: Diagnosis not present

## 2021-12-02 DIAGNOSIS — Z03818 Encounter for observation for suspected exposure to other biological agents ruled out: Secondary | ICD-10-CM | POA: Diagnosis not present

## 2021-12-07 ENCOUNTER — Encounter (HOSPITAL_BASED_OUTPATIENT_CLINIC_OR_DEPARTMENT_OTHER): Payer: Self-pay | Admitting: Physical Therapy

## 2021-12-07 ENCOUNTER — Ambulatory Visit (HOSPITAL_BASED_OUTPATIENT_CLINIC_OR_DEPARTMENT_OTHER): Payer: 59 | Admitting: Physical Therapy

## 2021-12-08 ENCOUNTER — Other Ambulatory Visit (HOSPITAL_COMMUNITY): Payer: Self-pay

## 2021-12-09 ENCOUNTER — Ambulatory Visit (HOSPITAL_BASED_OUTPATIENT_CLINIC_OR_DEPARTMENT_OTHER): Payer: 59 | Admitting: Physical Therapy

## 2021-12-09 ENCOUNTER — Other Ambulatory Visit (HOSPITAL_COMMUNITY): Payer: Self-pay

## 2021-12-10 ENCOUNTER — Other Ambulatory Visit (HOSPITAL_COMMUNITY): Payer: Self-pay

## 2021-12-14 ENCOUNTER — Other Ambulatory Visit (HOSPITAL_COMMUNITY): Payer: Self-pay

## 2021-12-16 ENCOUNTER — Ambulatory Visit (HOSPITAL_BASED_OUTPATIENT_CLINIC_OR_DEPARTMENT_OTHER): Payer: 59 | Admitting: Physical Therapy

## 2021-12-17 ENCOUNTER — Other Ambulatory Visit (HOSPITAL_COMMUNITY): Payer: Self-pay

## 2021-12-17 ENCOUNTER — Ambulatory Visit (HOSPITAL_BASED_OUTPATIENT_CLINIC_OR_DEPARTMENT_OTHER): Payer: 59 | Admitting: Physical Therapy

## 2021-12-17 DIAGNOSIS — E118 Type 2 diabetes mellitus with unspecified complications: Secondary | ICD-10-CM | POA: Diagnosis not present

## 2021-12-17 DIAGNOSIS — Z79899 Other long term (current) drug therapy: Secondary | ICD-10-CM | POA: Diagnosis not present

## 2021-12-17 DIAGNOSIS — Z79891 Long term (current) use of opiate analgesic: Secondary | ICD-10-CM | POA: Diagnosis not present

## 2021-12-17 DIAGNOSIS — I10 Essential (primary) hypertension: Secondary | ICD-10-CM | POA: Diagnosis not present

## 2021-12-17 DIAGNOSIS — E291 Testicular hypofunction: Secondary | ICD-10-CM | POA: Diagnosis not present

## 2021-12-17 DIAGNOSIS — F1120 Opioid dependence, uncomplicated: Secondary | ICD-10-CM | POA: Diagnosis not present

## 2021-12-17 MED ORDER — BUPRENORPHINE HCL-NALOXONE HCL 8-2 MG SL FILM
1.0000 | ORAL_FILM | Freq: Two times a day (BID) | SUBLINGUAL | 0 refills | Status: DC
  Filled 2021-12-17 – 2021-12-22 (×2): qty 60, 30d supply, fill #0

## 2021-12-21 ENCOUNTER — Other Ambulatory Visit: Payer: Self-pay

## 2021-12-21 ENCOUNTER — Encounter (HOSPITAL_BASED_OUTPATIENT_CLINIC_OR_DEPARTMENT_OTHER): Payer: Self-pay | Admitting: Physical Therapy

## 2021-12-21 ENCOUNTER — Ambulatory Visit (HOSPITAL_BASED_OUTPATIENT_CLINIC_OR_DEPARTMENT_OTHER): Payer: 59 | Attending: Orthopaedic Surgery | Admitting: Physical Therapy

## 2021-12-21 DIAGNOSIS — R2689 Other abnormalities of gait and mobility: Secondary | ICD-10-CM | POA: Diagnosis not present

## 2021-12-21 DIAGNOSIS — G8929 Other chronic pain: Secondary | ICD-10-CM | POA: Diagnosis not present

## 2021-12-21 DIAGNOSIS — M5441 Lumbago with sciatica, right side: Secondary | ICD-10-CM | POA: Diagnosis not present

## 2021-12-21 DIAGNOSIS — R293 Abnormal posture: Secondary | ICD-10-CM | POA: Insufficient documentation

## 2021-12-21 NOTE — Therapy (Signed)
OUTPATIENT PHYSICAL THERAPY TREATMENT NOTE   Patient Name: Brandon Gordon MRN: [id number] DOB:August 24, 1971, 51 y.o., male Today's Date: 12/23/2021  PCP: Shirline Frees, MD REFERRING PROVIDER: Shirline Frees, MD   PT End of Session - 12/23/21 0834     Visit Number 10    Number of Visits 16    Date for PT Re-Evaluation 02/03/22    PT Start Time 1148    PT Stop Time 1230   20 minutes of discussion of scheduling   PT Time Calculation (min) 42 min    Activity Tolerance Patient tolerated treatment well    Behavior During Therapy [facility] for tasks assessed/performed             Past Medical History:  Diagnosis Date   Anxiety    At risk for sleep apnea    STOP-BANG= 5      SENT TO PCP 04-05-2016   Chronic pain syndrome    History of kidney stones    Hyperlipidemia    Hypertension    Left ureteral stone    OA (osteoarthritis)    back, hip   Pain management    Type 2 diabetes mellitus ([place])    Past Surgical History:  Procedure Laterality Date   TOTAL HIP ARTHROPLASTY Right 04-22-2011   There are no problems to display for this patient.  PCP: Shirline Frees, MD REFERRING PROVIDER: Shirline Frees, MD   PCP: Shirline Frees, MD REFERRING PROVIDER: Jean Rosenthal    SUBJECTIVE STATEMENT: Patient has not made it in for almost a month 2nd to scheduling issues. He reports he had been feeling pretty good until about 3 days ago when he began having significant left hip pain. He can not recall a reason.  PERTINENT HISTORY:  Anxiety, Kidney stones, DMII, HTN, Potential mensicus tear on the right    PAIN:  Are you having pain? No pain today just pain with activity  VAS scale: 3/10 Pain location: left hip  Pain orientation: Right and Left  PAIN TYPE: aching Pain description: intermittent  Aggravating factors: night time  Relieving factors: time and rest      Today's treatment:    .    1/5  Most of the session was spent discussing scheduling and POC going forward.    Manual therapy: Trigger point release to quadratus/ lumbar paraspinals and gluteals; reviewed self softt tissue mobilization to the anterior hip   Measured strength and motion. Discussed and compared to first visit.     .    10/28   TPDN: to L2 L3 on the left; 4 spots needled good twitch and palpable increase. Patient gave permission for needling. Information packet given.    Manual therapy: cross hand hip and rib stretch in supine; MFR to QL and lumbar paraspinals. Anterior hip trigger point release and roller to anterior hip; LAD both from above the knee and below; posterior hip mobilization in supine Grade II and III;    Standing: reviewed standing right arm reach for home  Shoulder extension green 2x15 Scap retraction 2x15 green      10/19/2021   Pt seen for aquatic therapy today.  Treatment took place in water 3.25-4 ft in depth at the [facility] pool. Temp of water was 91.  Pt entered/exited the pool via stairs (step through pattern) independently with bilat rail.   Introduction to water. Had patient stand at different levels so they could feel the different depths of the water    Warm up: heel/toe walking x4 laps  across pool chest deep side stepping x4 laps from shallow to deep;  Ambulation with noodle support: long strides, march    Exercises; Slow march x20; hip extension x20; hip abduction x20; Sit to stand x20;     board trunk flexion x10 lateral board rotation x10 each way seated    With neck noddle and foot float; push pull x20; side to side perturbations from the feet x20; press and push x20                          Seated LAQ and March x20 2lbs    Float position: rythmic movement; push and pull;       Manual:  trigger point release to right side.    11/4 Manual therapy: cross hand hip and rib stretch in supine; MFR to QL and lumbar paraspinals. Anterior hip trigger point release and roller to anterior hip; LAD both from above the knee and below;  posterior hip mobilization in supine Grade II and III;    10/27/2021   Pt seen for aquatic therapy today.  Treatment took place in water 3.25-4 ft in depth at the [facility] pool. Temp of water was 91.  Pt entered/exited the pool via stairs (step through pattern) independently with bilat rail.   Introduction to water. Had patient stand at different levels so they could feel the different depths of the water    Warm up: heel/toe walking x4 laps across pool chest deep side stepping x4 laps from shallow to deep;  Ambulation with noodle support: long strides, march    Exercises; Slow march x20; hip extension x20; hip abduction x20; Sit to stand x20;     Weight push and pull 2x20 yellow    board trunk flexion x10 lateral board rotation x10 each way seated    With neck noddle and foot float; push pull x20; side to side perturbations from the feet x20; press and push x20                          Seated LAQ and March x20 2lbs    Float position: rythmic movement; push and pull;     11/16   Pt seen for aquatic therapy today.  Treatment took place in water 3.25-4 ft in depth at the [facility] pool. Temp of water was 91.  Pt entered/exited the pool via stairs (step through pattern) independently with bilat rail.   Introduction to water. Had patient stand at different levels so they could feel the different depths of the water    Warm up: heel/toe walking x4 laps across pool chest deep side stepping x4 laps from shallow to deep;  Ambulation with noodle support: long strides, march    Exercises; Slow march x20; hip extension x20; hip abduction x20; Sit to stand x20;     Weight push and pull 2x20 yellow    board trunk flexion x10 lateral board rotation x10 each way seated    With neck noddle and foot float; push pull x20; side to side perturbations from the feet x20; press and push x20                          Seated LAQ and March x20 2lbs    Float position: rythmic  movement; push and pull;   Stp up and lateral step up 2x10 each leg  Manual:  trigger point release to right side and left side .    11/18 TPDN: to L2 L3 on the right; 3 spots needled good twitch and palpable increase.  2 spots needled in the left gluteal.  Patient gave permission for needling. Information packet given. Skilled palpation of trigger points and monitoring during session;   Manual therapy: trigger point release to bilateral glueals and bilateral paraspinals    Anterior hip stretch at wall: reach with cuing not to drive through pain   Scap retraction green x20 with cuing for posture    Shoulder extension x20with cuing for posture.    Seated shoulder flexion 2x10 with wand             CLINICAL IMPRESSION: Today's session was limited. We spent most of the visit discussing plan going forward and the schedule. We were able to measure his strength and ROM. He had a slight improvement in lumbar flexion. Overall he continues to have a similar amount of extension that he stands in. His strength has improved. Therapy reviewed self soft tissue mobilization using a roller fo his anterior and lateral hip. He would benefit from further skilled therapy in the pool and land for 2W6 to work on improve ability to stand straight.    Abnormal gait, decreased activity tolerance, decreased mobility, difficulty walking, decreased ROM, decreased strength, increased fascial restrictions, increased muscle spasms, and pain   cleaning, community activity, yard work, and shopping   Time since onset of injury/illness/exacerbation and 1-2 comorbidities: right knee injury stemming from fall; right hip replacement; multi joint OA    REHAB POTENTIAL: Good   CLINICAL DECISION MAKING: Evolving/moderate complexity increasing flexion and forward rotation   EVALUATION COMPLEXITY: Moderate     GOALS: Goals reviewed with patient? Yes   SHORT TERM GOALS:   STG Name Target Date Goal status  1  Patient will improve standing flexion from 10-5 degrees  Baseline:  01/11/2021 Still 10 degrees  Ongoing   1/3  2 Patient will increase right hip flexion strength by 10 lbs  Baseline:  01/11/2022 Achieved 1/3  New goal 10 lbs   3 Patient will be independent with basic stretching and self trigger point release program  Baseline: 10/27/2022 Has been doing initial HEP      LONG TERM GOALS:    LTG Name Target Date Goal status  1 Patient will stand with neutral posture without increased pain or effort in order to perform daily activity  Baseline: 02/03/2022 Working towards neutral   Ongoing   2 Patient will be independent with a complete HEP including land and pool exercises  Baseline: 02/03/2022 Still working on establishing full HEP   Ongoing   3 Patient will return to golf and sporting activity without a significant increase in pain  Baseline: 12/03/2022 Has not started gol specific training   Ongoing     PLAN: PT FREQUENCY: 2x/week   PT DURATION:  weeks   PLANNED INTERVENTIONS: Therapeutic exercises, Therapeutic activity, Neuro Muscular re-education, Balance training, Gait training, Patient/Family education, Joint mobilization, Stair training, Aquatic Therapy, Dry Needling, Electrical stimulation, Cryotherapy, Taping, Ultrasound, and Manual therapy   PLAN FOR NEXT SESSION: If in the water work on stretching with movement; seated lengthening of the spine and hip strengthening; If the patient is on land, consider dry needling; soft tissue mobilization; left hip mobilization to improve extension; posterior chain strengthening ( current plan remains appropriate.     HOME EXERCISE PROGRAM: Access Code: [email or web address] URL: [email or web address] Date: 09/29/2021  Prepared by: Carolyne Littles Program Notes Reach up the doorway for a stretch  Exercises Reaching in Sidelying - 1 x daily - 7 x weekly - 3 sets - 10 reps Theracane Over Shoulder - 1 x daily - 7 x weekly - 3 sets - 10  reps           OBJECTIVE:    DIAGNOSTIC FINDINGS:  MRI: 2018: mild stable spondylosis      SCREENING FOR RED FLAGS: Bowel or bladder incontinence: No  Spinal tumors: No Cauda equina syndrome: No Compression fracture: No Abdominal aneurysm: No   COGNITION:          Overall cognitive status: Within functional limits for tasks assessed                        SENSATION:          Light touch: Intact           Stereognosis: Appears intact          Hot/Cold: Appears intact          Proprioception: Appears intact Begging to develop numbness in the hands and feet     POSTURE:  Leans to the left and rotated to the left    LUMBARAROM/PROM   A/PROM A/PROM  09/29/2021  Flexion 10-50  10-65  Extension Standing 10 degrees of flexion not able to come to neutral extension   Right lateral flexion    Left lateral flexion Stands in left flexion   Right rotation    Left rotation Stands in left rotation    (Blank rows = not tested)   LE AROM/PROM:   A/PROM Right 09/29/2021 Left 09/29/2021  Hip flexion   Pain with hip flexion          Hip extension   Left hip unable to come to neutral   Hip abduction      Hip adduction      Hip internal rotation Painful  Painful   Hip external rotation      Knee flexion      Knee extension      Ankle dorsiflexion      Ankle plantarflexion      Ankle inversion      Ankle eversion       (Blank rows = not tested)   LE MMT:   MMT Right 09/29/2021 Left 09/29/2021  Hip flexion 25.3 1/3 26.5 32.4 1/3 27.5  Hip extension      Hip abduction 24.1 1/3 42 28.5 1/3 44  Hip adduction      Hip internal rotation      Hip external rotation      Knee flexion 28.5 23.9  Knee extension 47.6 1/3 65.7 34.3 1/3 73.8  Ankle dorsiflexion      Ankle plantarflexion      Ankle inversion      Ankle eversion       (Blank rows = not tested)   Palpation:  continues to have spasming in the left gluteal and bilateral LE extemety's        GAIT: Comments: Ambulates with flexed posture with left anterior rotation.          Carney Living PT DPT  12/23/2021, 8:52 AM

## 2021-12-22 ENCOUNTER — Other Ambulatory Visit (HOSPITAL_COMMUNITY): Payer: Self-pay

## 2021-12-23 ENCOUNTER — Encounter (HOSPITAL_BASED_OUTPATIENT_CLINIC_OR_DEPARTMENT_OTHER): Payer: Self-pay | Admitting: Physical Therapy

## 2021-12-23 ENCOUNTER — Other Ambulatory Visit (HOSPITAL_COMMUNITY): Payer: Self-pay

## 2021-12-23 ENCOUNTER — Other Ambulatory Visit: Payer: Self-pay

## 2021-12-23 ENCOUNTER — Ambulatory Visit (HOSPITAL_BASED_OUTPATIENT_CLINIC_OR_DEPARTMENT_OTHER): Payer: 59 | Admitting: Physical Therapy

## 2021-12-23 DIAGNOSIS — R293 Abnormal posture: Secondary | ICD-10-CM

## 2021-12-23 DIAGNOSIS — R2689 Other abnormalities of gait and mobility: Secondary | ICD-10-CM

## 2021-12-23 DIAGNOSIS — G8929 Other chronic pain: Secondary | ICD-10-CM

## 2021-12-23 DIAGNOSIS — M5441 Lumbago with sciatica, right side: Secondary | ICD-10-CM | POA: Diagnosis not present

## 2021-12-23 MED ORDER — METFORMIN HCL ER 500 MG PO TB24
1000.0000 mg | ORAL_TABLET | Freq: Two times a day (BID) | ORAL | 1 refills | Status: DC
  Filled 2021-12-23: qty 120, 30d supply, fill #0
  Filled 2022-02-02: qty 120, 30d supply, fill #1

## 2021-12-24 ENCOUNTER — Encounter (HOSPITAL_BASED_OUTPATIENT_CLINIC_OR_DEPARTMENT_OTHER): Payer: Self-pay | Admitting: Physical Therapy

## 2021-12-24 NOTE — Therapy (Signed)
OUTPATIENT PHYSICAL THERAPY TREATMENT NOTE   Patient Name: Brandon Gordon MRN: [id number] DOB:01/09/1971, 51 y.o., male Today's Date: 12/24/2021  PCP: Shirline Frees, MD REFERRING PROVIDER: Shirline Frees, MD   PT End of Session - 12/23/21 1150     Visit Number 11    Number of Visits 22    Date for PT Re-Evaluation 02/03/22    PT Start Time [id number]    PT Stop Time 1230    PT Time Calculation (min) 45 min    Activity Tolerance Patient tolerated treatment well    Behavior During Therapy [facility] for tasks assessed/performed             Past Medical History:  Diagnosis Date   Anxiety    At risk for sleep apnea    STOP-BANG= 5      SENT TO PCP 04-05-2016   Chronic pain syndrome    History of kidney stones    Hyperlipidemia    Hypertension    Left ureteral stone    OA (osteoarthritis)    back, hip   Pain management    Type 2 diabetes mellitus ([place])    Past Surgical History:  Procedure Laterality Date   TOTAL HIP ARTHROPLASTY Right 04-22-2011   There are no problems to display for this patient.  PCP: Shirline Frees, MD REFERRING PROVIDER: Shirline Frees, MD   PCP: Shirline Frees, MD REFERRING PROVIDER: Jean Rosenthal    SUBJECTIVE STATEMENT: Patient has not made it in for almost a month 2nd to scheduling issues. He reports he had been feeling pretty good until about 3 days ago when he began having significant left hip pain. He can not recall a reason.  PERTINENT HISTORY:  Anxiety, Kidney stones, DMII, HTN, Potential mensicus tear on the right    PAIN:  Are you having pain? No pain today just pain with activity  VAS scale: 3/10 Pain location: left hip  Pain orientation: Right and Left  PAIN TYPE: aching Pain description: intermittent  Aggravating factors: night time  Relieving factors: time and rest      Today's treatment:    . 1/5 TPDN: to left gluteal  2 spots needled good twitch and palpable increase. Patient gave permission for needling.  Information packet given. Good twitch noted    Trigger point release to quadratus/ lumbar paraspinals and gluteals; reviewed self softt tissue mobilization to the anterior hip   Exercises:  Seated LAQ 2x10 red; Hip abduction 2x10 red      1/3  Most of the session was spent discussing scheduling and POC going forward.   Manual therapy: Trigger point release to quadratus/ lumbar paraspinals and gluteals; reviewed self softt tissue mobilization to the anterior hip   Measured strength and motion. Discussed and compared to first visit.       .    10/28   TPDN: to L2 L3 on the left; 4 spots needled good twitch and palpable increase. Patient gave permission for needling. Information packet given.    Manual therapy: cross hand hip and rib stretch in supine; MFR to QL and lumbar paraspinals. Anterior hip trigger point release and roller to anterior hip; LAD both from above the knee and below; posterior hip mobilization in supine Grade II and III;    Standing: reviewed standing right arm reach for home  Shoulder extension green 2x15 Scap retraction 2x15 green      10/19/2021   Pt seen for aquatic therapy today.  Treatment took place in water 3.25-4 ft in  depth at the [facility] pool. Temp of water was 91.  Pt entered/exited the pool via stairs (step through pattern) independently with bilat rail.   Introduction to water. Had patient stand at different levels so they could feel the different depths of the water    Warm up: heel/toe walking x4 laps across pool chest deep side stepping x4 laps from shallow to deep;  Ambulation with noodle support: long strides, march    Exercises; Slow march x20; hip extension x20; hip abduction x20; Sit to stand x20;     board trunk flexion x10 lateral board rotation x10 each way seated    With neck noddle and foot float; push pull x20; side to side perturbations from the feet x20; press and push x20                          Seated LAQ  and March x20 2lbs    Float position: rythmic movement; push and pull;       Manual:  trigger point release to right side.    11/4 Manual therapy: cross hand hip and rib stretch in supine; MFR to QL and lumbar paraspinals. Anterior hip trigger point release and roller to anterior hip; LAD both from above the knee and below; posterior hip mobilization in supine Grade II and III;    10/27/2021   Pt seen for aquatic therapy today.  Treatment took place in water 3.25-4 ft in depth at the [facility] pool. Temp of water was 91.  Pt entered/exited the pool via stairs (step through pattern) independently with bilat rail.   Introduction to water. Had patient stand at different levels so they could feel the different depths of the water    Warm up: heel/toe walking x4 laps across pool chest deep side stepping x4 laps from shallow to deep;  Ambulation with noodle support: long strides, march    Exercises; Slow march x20; hip extension x20; hip abduction x20; Sit to stand x20;     Weight push and pull 2x20 yellow    board trunk flexion x10 lateral board rotation x10 each way seated    With neck noddle and foot float; push pull x20; side to side perturbations from the feet x20; press and push x20                          Seated LAQ and March x20 2lbs    Float position: rythmic movement; push and pull;     11/16   Pt seen for aquatic therapy today.  Treatment took place in water 3.25-4 ft in depth at the [facility] pool. Temp of water was 91.  Pt entered/exited the pool via stairs (step through pattern) independently with bilat rail.   Introduction to water. Had patient stand at different levels so they could feel the different depths of the water    Warm up: heel/toe walking x4 laps across pool chest deep side stepping x4 laps from shallow to deep;  Ambulation with noodle support: long strides, march    Exercises; Slow march x20; hip extension x20; hip abduction x20;  Sit to stand x20;     Weight push and pull 2x20 yellow    board trunk flexion x10 lateral board rotation x10 each way seated    With neck noddle and foot float; push pull x20; side to side perturbations from the feet x20; press and push x20  Seated LAQ and March x20 2lbs    Float position: rythmic movement; push and pull;   Stp up and lateral step up 2x10 each leg      Manual:  trigger point release to right side and left side .    11/18 TPDN: to L2 L3 on the right; 3 spots needled good twitch and palpable increase.  2 spots needled in the left gluteal.  Patient gave permission for needling. Information packet given. Skilled palpation of trigger points and monitoring during session;   Manual therapy: trigger point release to bilateral glueals and bilateral paraspinals    Anterior hip stretch at wall: reach with cuing not to drive through pain   Scap retraction green x20 with cuing for posture    Shoulder extension x20with cuing for posture.    Seated shoulder flexion 2x10 with wand             CLINICAL IMPRESSION: Patient tolerated treatment well today We needled his gluteal. He had a great twitch response in his gluteal. Therapy performed manual trigger point release to his QL, gluteal , and lower lumbar spine. We also worked on stretching of his hip flexors. He had signifcant improvement in symptoms.    Abnormal gait, decreased activity tolerance, decreased mobility, difficulty walking, decreased ROM, decreased strength, increased fascial restrictions, increased muscle spasms, and pain   cleaning, community activity, yard work, and shopping   Time since onset of injury/illness/exacerbation and 1-2 comorbidities: right knee injury stemming from fall; right hip replacement; multi joint OA    REHAB POTENTIAL: Good   CLINICAL DECISION MAKING: Evolving/moderate complexity increasing flexion and forward rotation   EVALUATION COMPLEXITY: Moderate      GOALS: Goals reviewed with patient? Yes   SHORT TERM GOALS:   STG Name Target Date Goal status  1 Patient will improve standing flexion from 10-5 degrees  Baseline:  01/11/2021 Still 10 degrees  Ongoing   1/3  2 Patient will increase right hip flexion strength by 10 lbs  Baseline:  01/11/2022 Achieved 1/3  New goal 10 lbs   3 Patient will be independent with basic stretching and self trigger point release program  Baseline: 10/27/2022 Has been doing initial HEP      LONG TERM GOALS:    LTG Name Target Date Goal status  1 Patient will stand with neutral posture without increased pain or effort in order to perform daily activity  Baseline: 02/03/2022 Working towards neutral   Ongoing   2 Patient will be independent with a complete HEP including land and pool exercises  Baseline: 02/03/2022 Still working on establishing full HEP   Ongoing   3 Patient will return to golf and sporting activity without a significant increase in pain  Baseline: 12/03/2022 Has not started gol specific training   Ongoing     PLAN: PT FREQUENCY: 2x/week   PT DURATION:  weeks   PLANNED INTERVENTIONS: Therapeutic exercises, Therapeutic activity, Neuro Muscular re-education, Balance training, Gait training, Patient/Family education, Joint mobilization, Stair training, Aquatic Therapy, Dry Needling, Electrical stimulation, Cryotherapy, Taping, Ultrasound, and Manual therapy   PLAN FOR NEXT SESSION: If in the water work on stretching with movement; seated lengthening of the spine and hip strengthening; If the patient is on land, consider dry needling; soft tissue mobilization; left hip mobilization to improve extension; posterior chain strengthening ( current plan remains appropriate.     HOME EXERCISE PROGRAM: Access Code: [email or web address] URL: [email or web address] Date: 09/29/2021 Prepared by: Carolyne Littles Program Notes Reach up the  doorway for a stretch  Exercises Reaching in Sidelying - 1  x daily - 7 x weekly - 3 sets - 10 reps Theracane Over Shoulder - 1 x daily - 7 x weekly - 3 sets - 10 reps           OBJECTIVE:      Carney Living PT DPT  12/24/2021, 4:37 PM

## 2021-12-26 ENCOUNTER — Encounter (HOSPITAL_BASED_OUTPATIENT_CLINIC_OR_DEPARTMENT_OTHER): Payer: Self-pay | Admitting: Physical Therapy

## 2021-12-27 DIAGNOSIS — Z79899 Other long term (current) drug therapy: Secondary | ICD-10-CM | POA: Diagnosis not present

## 2021-12-31 ENCOUNTER — Other Ambulatory Visit (HOSPITAL_COMMUNITY): Payer: Self-pay

## 2022-01-04 ENCOUNTER — Other Ambulatory Visit (HOSPITAL_COMMUNITY): Payer: Self-pay

## 2022-01-04 MED ORDER — METOPROLOL SUCCINATE ER 100 MG PO TB24
100.0000 mg | ORAL_TABLET | Freq: Every day | ORAL | 0 refills | Status: DC
  Filled 2022-01-04: qty 90, 90d supply, fill #0

## 2022-01-05 ENCOUNTER — Other Ambulatory Visit (HOSPITAL_COMMUNITY): Payer: Self-pay

## 2022-01-05 MED ORDER — GABAPENTIN 600 MG PO TABS
600.0000 mg | ORAL_TABLET | Freq: Four times a day (QID) | ORAL | 1 refills | Status: DC
  Filled 2022-01-05: qty 360, 90d supply, fill #0
  Filled 2022-04-05: qty 360, 90d supply, fill #1

## 2022-01-05 MED ORDER — GABAPENTIN 600 MG PO TABS
600.0000 mg | ORAL_TABLET | Freq: Two times a day (BID) | ORAL | 0 refills | Status: DC
  Filled 2022-01-05: qty 180, 90d supply, fill #0

## 2022-01-12 ENCOUNTER — Encounter (HOSPITAL_BASED_OUTPATIENT_CLINIC_OR_DEPARTMENT_OTHER): Payer: Self-pay | Admitting: Physical Therapy

## 2022-01-12 ENCOUNTER — Other Ambulatory Visit: Payer: Self-pay

## 2022-01-12 ENCOUNTER — Ambulatory Visit (HOSPITAL_BASED_OUTPATIENT_CLINIC_OR_DEPARTMENT_OTHER): Payer: 59 | Admitting: Physical Therapy

## 2022-01-12 DIAGNOSIS — R293 Abnormal posture: Secondary | ICD-10-CM | POA: Diagnosis not present

## 2022-01-12 DIAGNOSIS — R2689 Other abnormalities of gait and mobility: Secondary | ICD-10-CM | POA: Diagnosis not present

## 2022-01-12 DIAGNOSIS — G8929 Other chronic pain: Secondary | ICD-10-CM

## 2022-01-12 DIAGNOSIS — M5441 Lumbago with sciatica, right side: Secondary | ICD-10-CM | POA: Diagnosis not present

## 2022-01-12 NOTE — Therapy (Signed)
OUTPATIENT PHYSICAL THERAPY TREATMENT NOTE   Patient Name: Brandon Gordon MRN: [id number] DOB:[birth year], 51 y.o., male Today's Date: 01/12/2022  PCP: Shirline Frees, MD REFERRING PROVIDER: Shirline Frees, MD   PT End of Session - 01/12/22 1352     Visit Number 12    Number of Visits 22    Date for PT Re-Evaluation 02/03/22    PT Start Time 1345    PT Stop Time 1428    PT Time Calculation (min) 43 min    Activity Tolerance Patient tolerated treatment well    Behavior During Therapy [facility] for tasks assessed/performed             Past Medical History:  Diagnosis Date   Anxiety    At risk for sleep apnea    STOP-BANG= 5      SENT TO PCP 04-05-2016   Chronic pain syndrome    History of kidney stones    Hyperlipidemia    Hypertension    Left ureteral stone    OA (osteoarthritis)    back, hip   Pain management    Type 2 diabetes mellitus ([place])    Past Surgical History:  Procedure Laterality Date   TOTAL HIP ARTHROPLASTY Right 04-22-2011   There are no problems to display for this patient.  PCP: Shirline Frees, MD REFERRING PROVIDER: Shirline Frees, MD   PCP: Shirline Frees, MD REFERRING PROVIDER: Jean Rosenthal    SUBJECTIVE STATEMENT: Patient reports he has been working on his left hip. He reports his neck was better after the last visit but it is hurting more now. He reports some of the pain that has improved is starting to get painful again.    PERTINENT HISTORY:  Anxiety, Kidney stones, DMII, HTN, Potential mensicus tear on the right    PAIN:  Are you having pain? No pain today just pain with activity  VAS scale: 5/10 Pain location: left hip  Pain orientation: Right and Left  PAIN TYPE: aching Pain description: intermittent  Aggravating factors: night time  Relieving factors: time and rest    PAIN:  Are you having pain? Yes VAS scale: 7/10 Pain location: neck  Pain orientation: Bilateral  PAIN TYPE: aching Pain description: constant   Aggravating factors: sitting  Relieving factors: cam crack his neck at times    Today's treatment:   1/26 Reviewed anterior stretching/ reach stretch 3x20 each side  Nu-step 5 min reviewed proper set up   TPDN: to left lateral quadriceps   4 spots needled good twitch and palpable increase. Patient gave permission for needling. Information packet given. Good twitch noted    Trigger point release to quadratus/ lumbar paraspinals and gluteals; reviewed self softt tissue mobilization to the anterior hip  Gross thoracic PA mobilization and trigger point release to bilateral upper traps         1/5 TPDN: to left gluteal  2 spots needled good twitch and palpable increase. Patient gave permission for needling. Information packet given. Good twitch noted    Trigger point release to quadratus/ lumbar paraspinals and gluteals; reviewed self softt tissue mobilization to the anterior hip   Exercises:  Seated LAQ 2x10 red; Hip abduction 2x10 red      1/3  Most of the session was spent discussing scheduling and POC going forward.   Manual therapy: Trigger point release to quadratus/ lumbar paraspinals and gluteals; reviewed self softt tissue mobilization to the anterior hip   Measured strength and motion. Discussed and compared to first visit.       Marland Kitchen  10/28   TPDN: to L2 L3 on the left; 4 spots needled good twitch and palpable increase. Patient gave permission for needling. Information packet given.    Manual therapy: cross hand hip and rib stretch in supine; MFR to QL and lumbar paraspinals. Anterior hip trigger point release and roller to anterior hip; LAD both from above the knee and below; posterior hip mobilization in supine Grade II and III;    Standing: reviewed standing right arm reach for home  Shoulder extension green 2x15 Scap retraction 2x15 green      10/19/2021   Pt seen for aquatic therapy today.  Treatment took place in water 3.25-4 ft in depth at the  [facility] pool. Temp of water was 91.  Pt entered/exited the pool via stairs (step through pattern) independently with bilat rail.   Introduction to water. Had patient stand at different levels so they could feel the different depths of the water    Warm up: heel/toe walking x4 laps across pool chest deep side stepping x4 laps from shallow to deep;  Ambulation with noodle support: long strides, march    Exercises; Slow march x20; hip extension x20; hip abduction x20; Sit to stand x20;     board trunk flexion x10 lateral board rotation x10 each way seated    With neck noddle and foot float; push pull x20; side to side perturbations from the feet x20; press and push x20                          Seated LAQ and March x20 2lbs    Float position: rythmic movement; push and pull;       Manual:  trigger point release to right side.           CLINICAL IMPRESSION: Therapy continues to work on getting the patients hip to come to a neutral position in supine. He is having pain in his upper back now as well. Therapy performed PA glides to his thoracic spine as he continues to have a significant and stiff kyphosis that is likely playing a role in his posture as well. It is also likely effecting his neck and upper back pain. We will work back into exercises next visit. We will focus on land for now. He has a full pool program.   Abnormal gait, decreased activity tolerance, decreased mobility, difficulty walking, decreased ROM, decreased strength, increased fascial restrictions, increased muscle spasms, and pain   cleaning, community activity, yard work, and shopping   Time since onset of injury/illness/exacerbation and 1-2 comorbidities: right knee injury stemming from fall; right hip replacement; multi joint OA    REHAB POTENTIAL: Good   CLINICAL DECISION MAKING: Evolving/moderate complexity increasing flexion and forward rotation   EVALUATION COMPLEXITY: Moderate      GOALS: Goals reviewed with patient? Yes   SHORT TERM GOALS:   STG Name Target Date Goal status  1 Patient will improve standing flexion from 10-5 degrees  Baseline:  01/11/2021 Still 10 degrees  Ongoing   1/3  2 Patient will increase right hip flexion strength by 10 lbs  Baseline:  01/11/2022 Achieved 1/3  New goal 10 lbs   3 Patient will be independent with basic stretching and self trigger point release program  Baseline: 10/27/2022 Has been doing initial HEP      LONG TERM GOALS:    LTG Name Target Date Goal status  1 Patient will stand with neutral posture without increased  pain or effort in order to perform daily activity  Baseline: 02/03/2022 Working towards neutral   Ongoing   2 Patient will be independent with a complete HEP including land and pool exercises  Baseline: 02/03/2022 Still working on establishing full HEP   Ongoing   3 Patient will return to golf and sporting activity without a significant increase in pain  Baseline: 12/03/2022 Has not started gol specific training   Ongoing     PLAN: PT FREQUENCY: 2x/week   PT DURATION:  weeks   PLANNED INTERVENTIONS: Therapeutic exercises, Therapeutic activity, Neuro Muscular re-education, Balance training, Gait training, Patient/Family education, Joint mobilization, Stair training, Aquatic Therapy, Dry Needling, Electrical stimulation, Cryotherapy, Taping, Ultrasound, and Manual therapy   PLAN FOR NEXT SESSION: If in the water work on stretching with movement; seated lengthening of the spine and hip strengthening; If the patient is on land, consider dry needling; soft tissue mobilization; left hip mobilization to improve extension; posterior chain strengthening ( current plan remains appropriate.     HOME EXERCISE PROGRAM: Access Code: [email or web address] URL: [email or web address] Date: 09/29/2021 Prepared by: Carolyne Littles Program Notes Reach up the doorway for a stretch  Exercises Reaching in Sidelying - 1  x daily - 7 x weekly - 3 sets - 10 reps Theracane Over Shoulder - 1 x daily - 7 x weekly - 3 sets - 10 reps           OBJECTIVE:      Carney Living PT DPT  01/13/2022 , 7:50 AM

## 2022-01-13 ENCOUNTER — Encounter (HOSPITAL_BASED_OUTPATIENT_CLINIC_OR_DEPARTMENT_OTHER): Payer: Self-pay | Admitting: Physical Therapy

## 2022-01-14 DIAGNOSIS — E291 Testicular hypofunction: Secondary | ICD-10-CM | POA: Diagnosis not present

## 2022-01-19 ENCOUNTER — Other Ambulatory Visit (HOSPITAL_COMMUNITY): Payer: Self-pay

## 2022-01-19 MED ORDER — ATORVASTATIN CALCIUM 40 MG PO TABS
40.0000 mg | ORAL_TABLET | Freq: Every day | ORAL | 2 refills | Status: DC
  Filled 2022-01-19: qty 90, 90d supply, fill #0

## 2022-01-20 ENCOUNTER — Encounter (HOSPITAL_BASED_OUTPATIENT_CLINIC_OR_DEPARTMENT_OTHER): Payer: Self-pay | Admitting: Physical Therapy

## 2022-01-21 ENCOUNTER — Ambulatory Visit (HOSPITAL_BASED_OUTPATIENT_CLINIC_OR_DEPARTMENT_OTHER): Payer: 59 | Admitting: Physical Therapy

## 2022-01-21 ENCOUNTER — Other Ambulatory Visit (HOSPITAL_COMMUNITY): Payer: Self-pay

## 2022-01-21 DIAGNOSIS — Z79899 Other long term (current) drug therapy: Secondary | ICD-10-CM | POA: Diagnosis not present

## 2022-01-21 DIAGNOSIS — E785 Hyperlipidemia, unspecified: Secondary | ICD-10-CM | POA: Diagnosis not present

## 2022-01-21 DIAGNOSIS — F1120 Opioid dependence, uncomplicated: Secondary | ICD-10-CM | POA: Diagnosis not present

## 2022-01-21 DIAGNOSIS — E1169 Type 2 diabetes mellitus with other specified complication: Secondary | ICD-10-CM | POA: Diagnosis not present

## 2022-01-21 DIAGNOSIS — F419 Anxiety disorder, unspecified: Secondary | ICD-10-CM | POA: Diagnosis not present

## 2022-01-21 DIAGNOSIS — E11621 Type 2 diabetes mellitus with foot ulcer: Secondary | ICD-10-CM | POA: Diagnosis not present

## 2022-01-21 DIAGNOSIS — L97528 Non-pressure chronic ulcer of other part of left foot with other specified severity: Secondary | ICD-10-CM | POA: Diagnosis not present

## 2022-01-21 MED ORDER — SULFAMETHOXAZOLE-TRIMETHOPRIM 800-160 MG PO TABS
1.0000 | ORAL_TABLET | Freq: Two times a day (BID) | ORAL | 0 refills | Status: DC
  Filled 2022-01-21: qty 20, 10d supply, fill #0

## 2022-01-21 MED ORDER — BUPRENORPHINE HCL-NALOXONE HCL 8-2 MG SL FILM
1.0000 | ORAL_FILM | Freq: Two times a day (BID) | SUBLINGUAL | 0 refills | Status: DC
Start: 2022-01-21 — End: 2022-02-17
  Filled 2022-01-21: qty 60, 30d supply, fill #0

## 2022-01-24 ENCOUNTER — Encounter: Payer: Self-pay | Admitting: Orthopedic Surgery

## 2022-01-24 ENCOUNTER — Ambulatory Visit (INDEPENDENT_AMBULATORY_CARE_PROVIDER_SITE_OTHER): Payer: 59

## 2022-01-24 ENCOUNTER — Ambulatory Visit (INDEPENDENT_AMBULATORY_CARE_PROVIDER_SITE_OTHER): Payer: 59 | Admitting: Orthopedic Surgery

## 2022-01-24 ENCOUNTER — Other Ambulatory Visit: Payer: Self-pay

## 2022-01-24 DIAGNOSIS — M79672 Pain in left foot: Secondary | ICD-10-CM | POA: Diagnosis not present

## 2022-01-24 DIAGNOSIS — L97524 Non-pressure chronic ulcer of other part of left foot with necrosis of bone: Secondary | ICD-10-CM | POA: Diagnosis not present

## 2022-01-24 NOTE — Progress Notes (Addendum)
Office Visit Note   Patient: Brandon Gordon           Date of Birth: 02/24/1971           MRN: [id number] Visit Date: 01/24/2022              Requested by: Johny Blamer, MD [phone] [place],  [place] [address] PCP: Johny Blamer, MD  Chief Complaint  Patient presents with   Left Foot - Pain      HPI: Patient is a 51 year old gentleman is seen for initial evaluation for necrotic ulcer plantar aspect left foot fifth metatarsal head.  Patient states he had a blister in December which healed with antibiotics he states that he has this new ulcer over the fifth metatarsal head plantar aspect.  He was started on Bactrim DS 3 days ago.  Patient states he does not have pain but does have odor.  Patient states that his most recent hemoglobin A1c is 7.6.  Assessment & Plan: Visit Diagnoses:  1. Non-pressure chronic ulcer of other part of left foot with necrosis of bone (HCC)   2. Pain in left foot     Plan: Discussed with patient and concern for the possibility of osteomyelitis of the fifth metatarsal head.  I have ordered an MRI scan and we will follow-up in 2 weeks.  He will continue with the Bactrim DS.  A postoperative shoe was fabricated with a pressure relieving felt donut to unload pressure from the fifth metatarsal head.  He will cleanse this with soap and water and use a Band-Aid.  Follow-Up Instructions: Return in about 2 weeks (around 02/07/2022).   Ortho Exam  Patient is alert, oriented, no adenopathy, well-dressed, normal affect, normal respiratory effort. Examination patient has a palpable dorsalis pedis and posterior tibial pulse.  There is no ascending cellulitis.  He has a necrotic ulcer beneath the fifth metatarsal head.  After informed consent a 10 blade knife was used to debride the skin and soft tissue back to healthy viable bleeding tissue this was touched with silver nitrate.  The ulcer was 1 cm in diameter prior to debridement after debridement it  was 3 cm in diameter and probes 1 cm deep down to bone.  Imaging: XR Foot 2 Views Left  Result Date: 01/24/2022 2 view radiographs of the left foot shows no destructive bony changes.  No images are attached to the encounter.  Labs: No results found for: HGBA1C, ESRSEDRATE, CRP, LABURIC, REPTSTATUS, GRAMSTAIN, CULT, LABORGA   No results found for: ALBUMIN, PREALBUMIN, CBC  No results found for: MG No results found for: VD25OH  No results found for: PREALBUMIN CBC EXTENDED Latest Ref Rng & Units 04/03/2020 03/30/2016 04/25/2011  WBC 4.0 - 10.5 K/uL 7.2 6.8 6.6  RBC 4.22 - 5.81 MIL/uL 5.05 4.82 3.38(L)  HGB 13.0 - 17.0 g/dL 53.6 64.4 0.3(K)  HCT 74.2 - 52.0 % 42.0 39.0 28.3(L)  PLT 150 - 400 K/uL 251 214 157  NEUTROABS 1.7 - 7.7 K/uL - 4.1 -  LYMPHSABS 0.7 - 4.0 K/uL - 2.1 -     There is no height or weight on file to calculate BMI.  Orders:  Orders Placed This Encounter  Procedures   XR Foot 2 Views Left   MR Foot Left w/o contrast   No orders of the defined types were placed in this encounter.    Procedures: No procedures performed  Clinical Data: No additional findings.  ROS:  All other  systems negative, except as noted in the HPI. Review of Systems  Objective: Vital Signs: There were no vitals taken for this visit.  Specialty Comments:  No specialty comments available.  PMFS History: There are no problems to display for this patient.  Past Medical History:  Diagnosis Date   Anxiety    At risk for sleep apnea    STOP-BANG= 5      SENT TO PCP 04-05-2016   Chronic pain syndrome    History of kidney stones    Hyperlipidemia    Hypertension    Left ureteral stone    OA (osteoarthritis)    back, hip   Pain management    Type 2 diabetes mellitus (HCC)     History reviewed. No pertinent family history.  Past Surgical History:  Procedure Laterality Date   TOTAL HIP ARTHROPLASTY Right 04-22-2011   Social History   Occupational History   Not on file   Tobacco Use   Smoking status: Never   Smokeless tobacco: Never  Substance and Sexual Activity   Alcohol use: Yes    Alcohol/week: 0.0 standard drinks   Drug use: No   Sexual activity: Yes    Birth control/protection: None

## 2022-01-25 ENCOUNTER — Ambulatory Visit (HOSPITAL_BASED_OUTPATIENT_CLINIC_OR_DEPARTMENT_OTHER): Payer: 59 | Attending: Orthopaedic Surgery | Admitting: Physical Therapy

## 2022-01-25 DIAGNOSIS — M5441 Lumbago with sciatica, right side: Secondary | ICD-10-CM | POA: Diagnosis not present

## 2022-01-25 DIAGNOSIS — G8929 Other chronic pain: Secondary | ICD-10-CM | POA: Diagnosis not present

## 2022-01-25 DIAGNOSIS — R293 Abnormal posture: Secondary | ICD-10-CM | POA: Diagnosis not present

## 2022-01-25 DIAGNOSIS — R2689 Other abnormalities of gait and mobility: Secondary | ICD-10-CM | POA: Insufficient documentation

## 2022-01-26 ENCOUNTER — Encounter (HOSPITAL_BASED_OUTPATIENT_CLINIC_OR_DEPARTMENT_OTHER): Payer: Self-pay | Admitting: Physical Therapy

## 2022-01-26 NOTE — Therapy (Signed)
OUTPATIENT PHYSICAL THERAPY TREATMENT NOTE   Patient Name: Brandon Gordon MRN: [id number] DOB:16-May-1971, 51 y.o., male Today's Date: 01/26/2022  PCP: Shirline Frees, MD REFERRING PROVIDER: Shirline Frees, MD   PT End of Session - 01/26/22 [id number]     Visit Number 13    Number of Visits 22    Date for PT Re-Evaluation 02/03/22    PT Start Time [id number]    PT Stop Time 1558    PT Time Calculation (min) 43 min    Activity Tolerance Patient tolerated treatment well    Behavior During Therapy [facility] for tasks assessed/performed             Past Medical History:  Diagnosis Date   Anxiety    At risk for sleep apnea    STOP-BANG= 5      SENT TO PCP 04-05-2016   Chronic pain syndrome    History of kidney stones    Hyperlipidemia    Hypertension    Left ureteral stone    OA (osteoarthritis)    back, hip   Pain management    Type 2 diabetes mellitus ([place])    Past Surgical History:  Procedure Laterality Date   TOTAL HIP ARTHROPLASTY Right 04-22-2011   There are no problems to display for this patient.  PCP: Shirline Frees, MD REFERRING PROVIDER: Shirline Frees, MD   PCP: Shirline Frees, MD REFERRING PROVIDER: Jean Rosenthal    SUBJECTIVE STATEMENT: Patient reports it had been feeling pretty good until a few days ago. Now it is spasming a bit in his back. He has been to the MD for a wound on his pinkie toe. He is not in an off loading shoe.  PERTINENT HISTORY:  Anxiety, Kidney stones, DMII, HTN, Potential mensicus tear on the right    PAIN:  Are you having pain? No pain today just pain with activity  VAS scale: 4/10 Pain location: left hip  Pain orientation: Right and Left  PAIN TYPE: aching Pain description: intermittent  Aggravating factors: night time  Relieving factors: time and rest      Today's treatment:  2/7  Trigger point release to quadratus/ lumbar paraspinals and gluteals; reviewed self softt tissue mobilization to the anterior hip  Side lying  rolling of the quad; more aggressive TP release to QL today. More space noted between the ribs and illiac crest today in side lying  Supine: LTR x20  Supine march 3x10 with core tightening  Supine wand flexion 2lbs 2x10 with core tightening   Standing scap retraction 3x10 green  Standing shoulder extension 3x10 green   Patient reported minor cramping in his back when he does the standing exercise       1/26 Reviewed anterior stretching/ reach stretch 3x20 each side  Nu-step 5 min reviewed proper set up   TPDN: to left lateral quadriceps   4 spots needled good twitch and palpable increase. Patient gave permission for needling. Information packet given. Good twitch noted    Trigger point release to quadratus/ lumbar paraspinals and gluteals; reviewed self softt tissue mobilization to the anterior hip  Gross thoracic PA mobilization and trigger point release to bilateral upper traps         1/5 TPDN: to left gluteal  2 spots needled good twitch and palpable increase. Patient gave permission for needling. Information packet given. Good twitch noted    Trigger point release to quadratus/ lumbar paraspinals and gluteals; reviewed self softt tissue mobilization to the anterior hip   Exercises:  Seated LAQ 2x10 red; Hip abduction 2x10 red      1/3  Most of the session was spent discussing scheduling and POC going forward.   Manual therapy: Trigger point release to quadratus/ lumbar paraspinals and gluteals; reviewed self softt tissue mobilization to the anterior hip   Measured strength and motion. Discussed and compared to first visit.       .    10/28   TPDN: to L2 L3 on the left; 4 spots needled good twitch and palpable increase. Patient gave permission for needling. Information packet given.    Manual therapy: cross hand hip and rib stretch in supine; MFR to QL and lumbar paraspinals. Anterior hip trigger point release and roller to anterior hip; LAD both from  above the knee and below; posterior hip mobilization in supine Grade II and III;    Standing: reviewed standing right arm reach for home  Shoulder extension green 2x15 Scap retraction 2x15 green      10/19/2021   Pt seen for aquatic therapy today.  Treatment took place in water 3.25-4 ft in depth at the [facility] pool. Temp of water was 91.  Pt entered/exited the pool via stairs (step through pattern) independently with bilat rail.   Introduction to water. Had patient stand at different levels so they could feel the different depths of the water    Warm up: heel/toe walking x4 laps across pool chest deep side stepping x4 laps from shallow to deep;  Ambulation with noodle support: long strides, march    Exercises; Slow march x20; hip extension x20; hip abduction x20; Sit to stand x20;     board trunk flexion x10 lateral board rotation x10 each way seated    With neck noddle and foot float; push pull x20; side to side perturbations from the feet x20; press and push x20                          Seated LAQ and March x20 2lbs    Float position: rythmic movement; push and pull;       Manual:  trigger point release to right side.           CLINICAL IMPRESSION: The patients QL seems to be loosening some. We will continue to work on his thoracic spine and hip as well as they are likely contributors to his overall posture. He has been working at home on his hip as well. He feels like some days it is getting flat to the table. He had minor spasming in his back when he works on standing exercises. He was encouraged to continue at home with these, but to listen to her back.  Abnormal gait, decreased activity tolerance, decreased mobility, difficulty walking, decreased ROM, decreased strength, increased fascial restrictions, increased muscle spasms, and pain   cleaning, community activity, yard work, and shopping   Time since onset of injury/illness/exacerbation and 1-2  comorbidities: right knee injury stemming from fall; right hip replacement; multi joint OA    REHAB POTENTIAL: Good   CLINICAL DECISION MAKING: Evolving/moderate complexity increasing flexion and forward rotation   EVALUATION COMPLEXITY: Moderate     GOALS: Goals reviewed with patient? Yes   SHORT TERM GOALS:   STG Name Target Date Goal status  1 Patient will improve standing flexion from 10-5 degrees  Baseline:  01/11/2021 Still 10 degrees  Ongoing   1/3  2 Patient will increase right hip flexion strength by 10 lbs  Baseline:  01/11/2022 Achieved 1/3  New goal 10 lbs   3 Patient will be independent with basic stretching and self trigger point release program  Baseline: 10/27/2022 Has been doing initial HEP      LONG TERM GOALS:    LTG Name Target Date Goal status  1 Patient will stand with neutral posture without increased pain or effort in order to perform daily activity  Baseline: 02/03/2022 Working towards neutral   Ongoing   2 Patient will be independent with a complete HEP including land and pool exercises  Baseline: 02/03/2022 Still working on establishing full HEP   Ongoing   3 Patient will return to golf and sporting activity without a significant increase in pain  Baseline: 12/03/2022 Has not started gol specific training   Ongoing     PLAN: PT FREQUENCY: 2x/week   PT DURATION:  weeks   PLANNED INTERVENTIONS: Therapeutic exercises, Therapeutic activity, Neuro Muscular re-education, Balance training, Gait training, Patient/Family education, Joint mobilization, Stair training, Aquatic Therapy, Dry Needling, Electrical stimulation, Cryotherapy, Taping, Ultrasound, and Manual therapy   PLAN FOR NEXT SESSION: If in the water work on stretching with movement; seated lengthening of the spine and hip strengthening; If the patient is on land, consider dry needling; soft tissue mobilization; left hip mobilization to improve extension; posterior chain strengthening (  current plan remains appropriate.     HOME EXERCISE PROGRAM: Access Code: [email or web address] URL: [email or web address] Date: 09/29/2021 Prepared by: Carolyne Littles Program Notes Reach up the doorway for a stretch  Exercises Reaching in Sidelying - 1 x daily - 7 x weekly - 3 sets - 10 reps Theracane Over Shoulder - 1 x daily - 7 x weekly - 3 sets - 10 reps           OBJECTIVE:      Carney Living PT DPT  01/26/2022 , 8:26 AM

## 2022-01-27 ENCOUNTER — Ambulatory Visit (HOSPITAL_BASED_OUTPATIENT_CLINIC_OR_DEPARTMENT_OTHER): Payer: 59 | Admitting: Physical Therapy

## 2022-01-27 ENCOUNTER — Other Ambulatory Visit: Payer: Self-pay

## 2022-01-27 DIAGNOSIS — G8929 Other chronic pain: Secondary | ICD-10-CM

## 2022-01-27 DIAGNOSIS — R2689 Other abnormalities of gait and mobility: Secondary | ICD-10-CM

## 2022-01-27 DIAGNOSIS — M5441 Lumbago with sciatica, right side: Secondary | ICD-10-CM | POA: Diagnosis not present

## 2022-01-27 DIAGNOSIS — R293 Abnormal posture: Secondary | ICD-10-CM

## 2022-01-28 ENCOUNTER — Encounter (HOSPITAL_BASED_OUTPATIENT_CLINIC_OR_DEPARTMENT_OTHER): Payer: Self-pay | Admitting: Physical Therapy

## 2022-01-28 NOTE — Therapy (Signed)
OUTPATIENT PHYSICAL THERAPY TREATMENT NOTE   Patient Name: Brandon Gordon MRN: [id number] DOB:11/08/1971, 51 y.o., male Today's Date: 01/28/2022  PCP: Shirline Frees, MD REFERRING PROVIDER: Shirline Frees, MD   PT End of Session - 01/28/22 0944     Visit Number 14    Number of Visits 22    Date for PT Re-Evaluation 02/03/22    PT Start Time [id number]    PT Stop Time 1229    PT Time Calculation (min) 44 min    Activity Tolerance Patient tolerated treatment well    Behavior During Therapy [facility] for tasks assessed/performed             Past Medical History:  Diagnosis Date   Anxiety    At risk for sleep apnea    STOP-BANG= 5      SENT TO PCP 04-05-2016   Chronic pain syndrome    History of kidney stones    Hyperlipidemia    Hypertension    Left ureteral stone    OA (osteoarthritis)    back, hip   Pain management    Type 2 diabetes mellitus ([place])    Past Surgical History:  Procedure Laterality Date   TOTAL HIP ARTHROPLASTY Right 04-22-2011   There are no problems to display for this patient.  PCP: Shirline Frees, MD REFERRING PROVIDER: Shirline Frees, MD   PCP: Shirline Frees, MD REFERRING PROVIDER: Jean Rosenthal    SUBJECTIVE STATEMENT: The patient reports he has felt pretty good over the past few days. He feels like he is standing straighter.   PERTINENT HISTORY:  Anxiety, Kidney stones, DMII, HTN, Potential mensicus tear on the right    PAIN:  Are you having pain? No pain today just pain with activity  VAS scale: 4/10 Pain location: left hip  Pain orientation: Right and Left  PAIN TYPE: aching Pain description: intermittent  Aggravating factors: night time  Relieving factors: time and rest      Today's treatment:  2/10  Trigger point release to quadratus/ lumbar paraspinals and gluteals; reviewed self softt tissue mobilization to the anterior hip  Side lying rolling of the quad; PA mobilizations to the upper back/T-spine. Trigger point  release to the upper traps.  Supine: LTR x20  Supine hip abduction green 3x15   Supine with feet on ball for decompression   Ball press 2x10  Ball press and roll 2x10   Wand flexion 3x10, took pictures and video of the patient doing it at home.     2/7  Trigger point release to quadratus/ lumbar paraspinals and gluteals; reviewed self softt tissue mobilization to the anterior hip  Side lying rolling of the quad; more aggressive TP release to QL today. More space noted between the ribs and illiac crest today in side lying  Supine: LTR x20  Supine march 3x10 with core tightening  Supine wand flexion 2lbs 2x10 with core tightening   Standing scap retraction 3x10 green  Standing shoulder extension 3x10 green   Patient reported minor cramping in his back when he does the standing exercise       1/26 Reviewed anterior stretching/ reach stretch 3x20 each side  Nu-step 5 min reviewed proper set up   TPDN: to left lateral quadriceps   4 spots needled good twitch and palpable increase. Patient gave permission for needling. Information packet given. Good twitch noted    Trigger point release to quadratus/ lumbar paraspinals and gluteals; reviewed self softt tissue mobilization to the anterior hip  Gross  thoracic PA mobilization and trigger point release to bilateral upper traps              CLINICAL IMPRESSION: The patient is making progress. He is having less pain and his hip is extending better. We continue to focus on manual therapy through out his spine. We will begin to advance more into an exercise based program as tolerated. Therapy reviewed exercises with a ball from a decompression position. Despite position he still had minor pain in his back.    Abnormal gait, decreased activity tolerance, decreased mobility, difficulty walking, decreased ROM, decreased strength, increased fascial restrictions, increased muscle spasms, and pain   cleaning, community activity, yard  work, and shopping   Time since onset of injury/illness/exacerbation and 1-2 comorbidities: right knee injury stemming from fall; right hip replacement; multi joint OA    REHAB POTENTIAL: Good   CLINICAL DECISION MAKING: Evolving/moderate complexity increasing flexion and forward rotation   EVALUATION COMPLEXITY: Moderate     GOALS: Goals reviewed with patient? Yes   SHORT TERM GOALS:   STG Name Target Date Goal status  1 Patient will improve standing flexion from 10-5 degrees  Baseline:  01/11/2021 Still 10 degrees  Ongoing   1/3  2 Patient will increase right hip flexion strength by 10 lbs  Baseline:  01/11/2022 Achieved 1/3  New goal 10 lbs   3 Patient will be independent with basic stretching and self trigger point release program  Baseline: 10/27/2022 Has been doing initial HEP      LONG TERM GOALS:    LTG Name Target Date Goal status  1 Patient will stand with neutral posture without increased pain or effort in order to perform daily activity  Baseline: 02/03/2022 Working towards neutral   Ongoing   2 Patient will be independent with a complete HEP including land and pool exercises  Baseline: 02/03/2022 Still working on establishing full HEP   Ongoing   3 Patient will return to golf and sporting activity without a significant increase in pain  Baseline: 12/03/2022 Has not started gol specific training   Ongoing     PLAN: PT FREQUENCY: 2x/week   PT DURATION:  weeks   PLANNED INTERVENTIONS: Therapeutic exercises, Therapeutic activity, Neuro Muscular re-education, Balance training, Gait training, Patient/Family education, Joint mobilization, Stair training, Aquatic Therapy, Dry Needling, Electrical stimulation, Cryotherapy, Taping, Ultrasound, and Manual therapy   PLAN FOR NEXT SESSION: If in the water work on stretching with movement; seated lengthening of the spine and hip strengthening; If the patient is on land, consider dry needling; soft tissue mobilization;  left hip mobilization to improve extension; posterior chain strengthening ( current plan remains appropriate. Continue  with needling as tolerated/     HOME EXERCISE PROGRAM: Access Code: [email or web address] URL: [email or web address] Date: 09/29/2021 Prepared by: Carolyne Littles Program Notes Reach up the doorway for a stretch  Exercises Reaching in Sidelying - 1 x daily - 7 x weekly - 3 sets - 10 reps Theracane Over Shoulder - 1 x daily - 7 x weekly - 3 sets - 10 reps           OBJECTIVE:      Carney Living PT DPT  01/28/2022 , 9:46 AM

## 2022-02-01 ENCOUNTER — Other Ambulatory Visit: Payer: Self-pay

## 2022-02-01 ENCOUNTER — Ambulatory Visit (HOSPITAL_BASED_OUTPATIENT_CLINIC_OR_DEPARTMENT_OTHER): Payer: 59 | Admitting: Physical Therapy

## 2022-02-01 ENCOUNTER — Encounter (HOSPITAL_BASED_OUTPATIENT_CLINIC_OR_DEPARTMENT_OTHER): Payer: Self-pay | Admitting: Physical Therapy

## 2022-02-01 DIAGNOSIS — R293 Abnormal posture: Secondary | ICD-10-CM | POA: Diagnosis not present

## 2022-02-01 DIAGNOSIS — G8929 Other chronic pain: Secondary | ICD-10-CM

## 2022-02-01 DIAGNOSIS — R2689 Other abnormalities of gait and mobility: Secondary | ICD-10-CM | POA: Diagnosis not present

## 2022-02-01 DIAGNOSIS — M5441 Lumbago with sciatica, right side: Secondary | ICD-10-CM | POA: Diagnosis not present

## 2022-02-02 ENCOUNTER — Other Ambulatory Visit (HOSPITAL_COMMUNITY): Payer: Self-pay

## 2022-02-02 ENCOUNTER — Encounter (HOSPITAL_BASED_OUTPATIENT_CLINIC_OR_DEPARTMENT_OTHER): Payer: Self-pay | Admitting: Physical Therapy

## 2022-02-02 NOTE — Therapy (Signed)
OUTPATIENT PHYSICAL THERAPY TREATMENT NOTE   Patient Name: Brandon Gordon MRN: [id number] DOB:December 26, 1970, 51 y.o., male Today's Date: 02/02/2022  PCP: Johny Blamer, MD REFERRING PROVIDER: Johny Blamer, MD   PT End of Session - 02/01/22 1524     Visit Number 15    Number of Visits 22    Date for PT Re-Evaluation 02/03/22    PT Start Time 1518    PT Stop Time 1559    PT Time Calculation (min) 41 min    Activity Tolerance Patient tolerated treatment well    Behavior During Therapy [facility] for tasks assessed/performed             Past Medical History:  Diagnosis Date   Anxiety    At risk for sleep apnea    STOP-BANG= 5      SENT TO PCP 04-05-2016   Chronic pain syndrome    History of kidney stones    Hyperlipidemia    Hypertension    Left ureteral stone    OA (osteoarthritis)    back, hip   Pain management    Type 2 diabetes mellitus (HCC)    Past Surgical History:  Procedure Laterality Date   TOTAL HIP ARTHROPLASTY Right 04-22-2011   There are no problems to display for this patient.  PCP: Johny Blamer, MD REFERRING PROVIDER: Johny Blamer, MD   PCP: Johny Blamer, MD REFERRING PROVIDER: Doneen Poisson    SUBJECTIVE STATEMENT: The patient continues to progress. He reports when he stands for too long he has significant gluteal pain but he is working on his exercises and stretching.   PERTINENT HISTORY:  Anxiety, Kidney stones, DMII, HTN, Potential mensicus tear on the right    PAIN:  Are you having pain? No pain today just pain with activity  2/15 VAS scale: 3/10 when standing  Pain location: left hip  Pain orientation: Right and Left  PAIN TYPE: aching Pain description: intermittent  Aggravating factors: night time  Relieving factors: time and rest      Today's treatment:  2/15 Trigger point release to quadratus/ lumbar paraspinals and gluteals; reviewed self softt tissue mobilization to the anterior hip  Side lying rolling of the  quad; PA mobilizations to the upper back/T-spine. Trigger point release to the upper traps.   Cable row 10 lbs 3x10 tried to advance weight but had increased pain in his gluteal Cable extension 3x10   Knee extension 15 lbs 3x10  Leg press 3x10 70 lbs ( may have been too light)  2/10  Trigger point release to quadratus/ lumbar paraspinals and gluteals; reviewed self softt tissue mobilization to the anterior hip  Side lying rolling of the quad; PA mobilizations to the upper back/T-spine. Trigger point release to the upper traps.  Supine: LTR x20  Supine hip abduction green 3x15   Supine with feet on ball for decompression   Ball press 2x10  Ball press and roll 2x10   Wand flexion 3x10, took pictures and video of the patient doing it at home.     2/7  Trigger point release to quadratus/ lumbar paraspinals and gluteals; reviewed self softt tissue mobilization to the anterior hip  Side lying rolling of the quad; more aggressive TP release to QL today. More space noted between the ribs and illiac crest today in side lying  Supine: LTR x20  Supine march 3x10 with core tightening  Supine wand flexion 2lbs 2x10 with core tightening   Standing scap retraction 3x10 green  Standing shoulder extension  3x10 green   Patient reported minor cramping in his back when he does the standing exercise       1/26 Reviewed anterior stretching/ reach stretch 3x20 each side  Nu-step 5 min reviewed proper set up   TPDN: to left lateral quadriceps   4 spots needled good twitch and palpable increase. Patient gave permission for needling. Information packet given. Good twitch noted    Trigger point release to quadratus/ lumbar paraspinals and gluteals; reviewed self softt tissue mobilization to the anterior hip  Gross thoracic PA mobilization and trigger point release to bilateral upper traps              CLINICAL IMPRESSION: Therapy trialed patient on gym equipment today. He dd well. When  we advanced his weight, he had pain in the area he ussually has pain. When the weights were decreased he was able to work without the pain. He was advised that this means we are working activity's that are likely going to be beneficial to his standing endurance. We expect minor soreness over the next few days as he gets more used to these exercises. Therapy will continue to progress as tolerated.    Abnormal gait, decreased activity tolerance, decreased mobility, difficulty walking, decreased ROM, decreased strength, increased fascial restrictions, increased muscle spasms, and pain   cleaning, community activity, yard work, and shopping   Time since onset of injury/illness/exacerbation and 1-2 comorbidities: right knee injury stemming from fall; right hip replacement; multi joint OA    REHAB POTENTIAL: Good   CLINICAL DECISION MAKING: Evolving/moderate complexity increasing flexion and forward rotation   EVALUATION COMPLEXITY: Moderate     GOALS: Goals reviewed with patient? Yes   SHORT TERM GOALS:   STG Name Target Date Goal status  1 Patient will improve standing flexion from 10-5 degrees  Baseline:  01/11/2021 Still 10 degrees  Ongoing   1/3  2 Patient will increase right hip flexion strength by 10 lbs  Baseline:  01/11/2022 Achieved 1/3  New goal 10 lbs   3 Patient will be independent with basic stretching and self trigger point release program  Baseline: 10/27/2022 Has been doing initial HEP      LONG TERM GOALS:    LTG Name Target Date Goal status  1 Patient will stand with neutral posture without increased pain or effort in order to perform daily activity  Baseline: 02/03/2022 Working towards neutral   Ongoing   2 Patient will be independent with a complete HEP including land and pool exercises  Baseline: 02/03/2022 Still working on establishing full HEP   Ongoing   3 Patient will return to golf and sporting activity without a significant increase in pain  Baseline:  12/03/2022 Has not started gol specific training   Ongoing     PLAN: PT FREQUENCY: 2x/week   PT DURATION:  weeks   PLANNED INTERVENTIONS: Therapeutic exercises, Therapeutic activity, Neuro Muscular re-education, Balance training, Gait training, Patient/Family education, Joint mobilization, Stair training, Aquatic Therapy, Dry Needling, Electrical stimulation, Cryotherapy, Taping, Ultrasound, and Manual therapy   PLAN FOR NEXT SESSION: If in the water work on stretching with movement; seated lengthening of the spine and hip strengthening; If the patient is on land, consider dry needling; soft tissue mobilization; left hip mobilization to improve extension; posterior chain strengthening ( current plan remains appropriate. Continue  with needling as tolerated/     HOME EXERCISE PROGRAM: Access Code: [email or web address] URL: [email or web address] Date: 09/29/2021 Prepared by: Lorayne Bender Program Notes Reach up the doorway  for a stretch  Exercises Reaching in Sidelying - 1 x daily - 7 x weekly - 3 sets - 10 reps Theracane Over Shoulder - 1 x daily - 7 x weekly - 3 sets - 10 reps                Dessie Coma PT DPT  02/02/2022 , 9:27 AM

## 2022-02-03 ENCOUNTER — Ambulatory Visit
Admission: RE | Admit: 2022-02-03 | Discharge: 2022-02-03 | Disposition: A | Discharge status: Home / Self Care | Payer: 59 | Source: Ambulatory Visit | Attending: Orthopedic Surgery | Admitting: Orthopedic Surgery

## 2022-02-03 ENCOUNTER — Ambulatory Visit (HOSPITAL_BASED_OUTPATIENT_CLINIC_OR_DEPARTMENT_OTHER): Payer: 59 | Admitting: Physical Therapy

## 2022-02-03 ENCOUNTER — Other Ambulatory Visit: Payer: Self-pay

## 2022-02-03 DIAGNOSIS — L97524 Non-pressure chronic ulcer of other part of left foot with necrosis of bone: Secondary | ICD-10-CM

## 2022-02-03 DIAGNOSIS — R600 Localized edema: Secondary | ICD-10-CM | POA: Diagnosis not present

## 2022-02-04 ENCOUNTER — Other Ambulatory Visit (HOSPITAL_COMMUNITY): Payer: Self-pay

## 2022-02-04 MED ORDER — ALPRAZOLAM 1 MG PO TABS
2.0000 mg | ORAL_TABLET | Freq: Two times a day (BID) | ORAL | 3 refills | Status: DC | PRN
Start: 2022-02-04 — End: 2023-12-08
  Filled 2022-02-04: qty 120, 30d supply, fill #0
  Filled 2022-04-05: qty 120, 30d supply, fill #1
  Filled 2022-06-03: qty 120, 30d supply, fill #2
  Filled 2022-08-03: qty 120, 30d supply, fill #3

## 2022-02-07 ENCOUNTER — Other Ambulatory Visit (HOSPITAL_COMMUNITY): Payer: Self-pay

## 2022-02-07 ENCOUNTER — Other Ambulatory Visit: Payer: Self-pay

## 2022-02-07 ENCOUNTER — Ambulatory Visit (INDEPENDENT_AMBULATORY_CARE_PROVIDER_SITE_OTHER): Payer: 59 | Admitting: Orthopedic Surgery

## 2022-02-07 ENCOUNTER — Encounter: Payer: Self-pay | Admitting: Orthopedic Surgery

## 2022-02-07 DIAGNOSIS — L97524 Non-pressure chronic ulcer of other part of left foot with necrosis of bone: Secondary | ICD-10-CM | POA: Diagnosis not present

## 2022-02-07 NOTE — Progress Notes (Signed)
Office Visit Note   Patient: Brandon Gordon           Date of Birth: 02-Feb-1971           MRN: [id number] Visit Date: 02/07/2022              Requested by: Johny Blamer, MD [phone] [place],  [place] [address] PCP: Johny Blamer, MD  Chief Complaint  Patient presents with   Left Foot - Follow-up    MRI review      HPI: Patient is a 51 year old gentleman with a chronic ulcer beneath the fifth metatarsal head left foot.  Patient is seen in follow-up after his MRI scan.  Assessment & Plan: Visit Diagnoses:  1. Non-pressure chronic ulcer of other part of left foot with necrosis of bone (HCC)     Plan: With the ulcer and osteomyelitis I have recommended proceeding with 1/5 ray amputation risk and benefits were discussed patient states he understands wished to proceed at this time in 2 weeks.  Patient is currently on Suboxone from a pain clinic he does have a right total hip arthroplasty.  He will discuss with them pain management options after surgery.  Follow-Up Instructions: Return in about 3 weeks (around 02/28/2022).   Ortho Exam  Patient is alert, oriented, no adenopathy, well-dressed, normal affect, normal respiratory effort. Examination patient has a palpable dorsalis pedis pulse there is no redness or cellulitis in the foot.  He has a chronic ulcer beneath the fifth metatarsal head left foot.  This ulcer probes to bone.  Review of the MRI scan shows osteomyelitis of the proximal phalanx little toe  Imaging: No results found. No images are attached to the encounter.  Labs: No results found for: HGBA1C, ESRSEDRATE, CRP, LABURIC, REPTSTATUS, GRAMSTAIN, CULT, LABORGA   No results found for: ALBUMIN, PREALBUMIN, CBC  No results found for: MG No results found for: VD25OH  No results found for: PREALBUMIN CBC EXTENDED Latest Ref Rng & Units 04/03/2020 03/30/2016 04/25/2011  WBC 4.0 - 10.5 K/uL 7.2 6.8 6.6  RBC 4.22 - 5.81 MIL/uL 5.05 4.82 3.38(L)   HGB 13.0 - 17.0 g/dL 42.5 95.6 3.8(V)  HCT 56.4 - 52.0 % 42.0 39.0 28.3(L)  PLT 150 - 400 K/uL 251 214 157  NEUTROABS 1.7 - 7.7 K/uL - 4.1 -  LYMPHSABS 0.7 - 4.0 K/uL - 2.1 -     There is no height or weight on file to calculate BMI.  Orders:  No orders of the defined types were placed in this encounter.  No orders of the defined types were placed in this encounter.    Procedures: No procedures performed  Clinical Data: No additional findings.  ROS:  All other systems negative, except as noted in the HPI. Review of Systems  Objective: Vital Signs: There were no vitals taken for this visit.  Specialty Comments:  No specialty comments available.  PMFS History: There are no problems to display for this patient.  Past Medical History:  Diagnosis Date   Anxiety    At risk for sleep apnea    STOP-BANG= 5      SENT TO PCP 04-05-2016   Chronic pain syndrome    History of kidney stones    Hyperlipidemia    Hypertension    Left ureteral stone    OA (osteoarthritis)    back, hip   Pain management    Type 2 diabetes mellitus (HCC)     History reviewed. No  pertinent family history.  Past Surgical History:  Procedure Laterality Date   TOTAL HIP ARTHROPLASTY Right 04-22-2011   Social History   Occupational History   Not on file  Tobacco Use   Smoking status: Never   Smokeless tobacco: Never  Substance and Sexual Activity   Alcohol use: Yes    Alcohol/week: 0.0 standard drinks   Drug use: No   Sexual activity: Yes    Birth control/protection: None

## 2022-02-08 ENCOUNTER — Encounter (HOSPITAL_BASED_OUTPATIENT_CLINIC_OR_DEPARTMENT_OTHER): Payer: Self-pay | Admitting: Physical Therapy

## 2022-02-08 ENCOUNTER — Ambulatory Visit (HOSPITAL_BASED_OUTPATIENT_CLINIC_OR_DEPARTMENT_OTHER): Payer: 59 | Admitting: Physical Therapy

## 2022-02-08 DIAGNOSIS — R2689 Other abnormalities of gait and mobility: Secondary | ICD-10-CM

## 2022-02-08 DIAGNOSIS — M5441 Lumbago with sciatica, right side: Secondary | ICD-10-CM

## 2022-02-08 DIAGNOSIS — R293 Abnormal posture: Secondary | ICD-10-CM | POA: Diagnosis not present

## 2022-02-08 DIAGNOSIS — G8929 Other chronic pain: Secondary | ICD-10-CM | POA: Diagnosis not present

## 2022-02-08 NOTE — Therapy (Signed)
OUTPATIENT PHYSICAL THERAPY TREATMENT NOTE   Patient Name: Brandon Gordon MRN: [id number] DOB:29-Nov-1971, 51 y.o., male Today's Date: 02/08/2022  PCP: Johny Blamer, MD REFERRING PROVIDER: Johny Blamer, MD     Past Medical History:  Diagnosis Date   Anxiety    At risk for sleep apnea    STOP-BANG= 5      SENT TO PCP 04-05-2016   Chronic pain syndrome    History of kidney stones    Hyperlipidemia    Hypertension    Left ureteral stone    OA (osteoarthritis)    back, hip   Pain management    Type 2 diabetes mellitus (HCC)    Past Surgical History:  Procedure Laterality Date   TOTAL HIP ARTHROPLASTY Right 04-22-2011   There are no problems to display for this patient.  PCP: Johny Blamer, MD REFERRING PROVIDER: Johny Blamer, MD   PCP: Johny Blamer, MD REFERRING PROVIDER: Doneen Poisson    SUBJECTIVE STATEMENT: Per visual inspection the patient is standing straighter then he has in the past. He will be having a 5th toe amputation in the next 2 weeks. He reports the pain is much better in his hip.   PERTINENT HISTORY:  Anxiety, Kidney stones, DMII, HTN, Potential mensicus tear on the right    PAIN:  Are you having pain? No pain today just pain with activity  2/15 VAS scale: 3/10 when standing  Pain location: left hip  Pain orientation: Right and Left  PAIN TYPE: aching Pain description: intermittent  Aggravating factors: night time  Relieving factors: time and rest      Objective   LUMBARAROM/PROM  Precautions: active bone infection    A/PROM A/PROM  09/29/2021  Flexion 10-50  10-65  2/21 7-65  Extension 2/21 Standing 7 degrees of flexion not able to come to neutral extension   Right lateral flexion    Left lateral flexion Stands in left flexion   Right rotation    Left rotation Stands in left rotation    (Blank rows = not tested)   LE AROM/PROM:   A/PROM Right 09/29/2021 Left 09/29/2021  Hip flexion   Pain with hip flexion           Hip extension   Left hip unable to come to neutral   Hip abduction      Hip adduction      Hip internal rotation Painful  Painful   Hip external rotation      Knee flexion      Knee extension      Ankle dorsiflexion      Ankle plantarflexion      Ankle inversion      Ankle eversion       (Blank rows = not tested)   LE MMT:   MMT Right 09/29/2021 Left 09/29/2021  Hip flexion 25.3 1/3 26.5  2/21  25.1 32.4 1/3 27.5  2/21  29.9  Hip extension      Hip abduction 24.1  1/3 42  2/21 64.7 28.5  1/3 44  2/21 61.6   Hip adduction      Hip internal rotation      Hip external rotation      Knee flexion 28.5 23.9  Knee extension 47.6  1/3 65.7  2/21 70.1 34.3  1/3 73.8  2/21 73.7  Ankle dorsiflexion      Ankle plantarflexion      Ankle inversion      Ankle eversion       (  Blank rows = not tested)   Palpation:  continues to have spasming in the left gluteal and bilateral LE extemety's         Today's treatment:   2/21 Reviewed strength and motion measurements.  Reviewed motion measurements and improvements   Knee extension 15 lbs 3x10  Leg press 3x15 110 lbs 2x 90 lbs   Chop 2x10 5 lbs  Pallof press x10 5 lbs  Shoulder extension 2x10  Row x10 10 lbs      2/15 Trigger point release to quadratus/ lumbar paraspinals and gluteals; reviewed self softt tissue mobilization to the anterior hip  Side lying rolling of the quad; PA mobilizations to the upper back/T-spine. Trigger point release to the upper traps.   Cable row 10 lbs 3x10 tried to advance weight but had increased pain in his gluteal Cable extension 3x10   Knee extension 15 lbs 3x10  Leg press 3x10 70 lbs ( may have been too light)  2/10  Trigger point release to quadratus/ lumbar paraspinals and gluteals; reviewed self softt tissue mobilization to the anterior hip  Side lying rolling of the quad; PA mobilizations to the upper back/T-spine. Trigger point release  to the upper traps.  Supine: LTR x20  Supine hip abduction green 3x15   Supine with feet on ball for decompression   Ball press 2x10  Ball press and roll 2x10   Wand flexion 3x10, took pictures and video of the patient doing it at home.     2/7  Trigger point release to quadratus/ lumbar paraspinals and gluteals; reviewed self softt tissue mobilization to the anterior hip  Side lying rolling of the quad; more aggressive TP release to QL today. More space noted between the ribs and illiac crest today in side lying  Supine: LTR x20  Supine march 3x10 with core tightening  Supine wand flexion 2lbs 2x10 with core tightening   Standing scap retraction 3x10 green  Standing shoulder extension 3x10 green   Patient reported minor cramping in his back when he does the standing exercis     CLINICAL IMPRESSION: The patient is making good progress. His hip abduction strength has improved significantly since the last evaluation. He is having less pain in his gluteal. His back ROM is improving as well. He is coming to a more upright position in standing, although he is still not coming to a full neutral standing position but he is improving . Therapy started golf specific strengthening exercises today. We will continue to work until he hads his surgery. We will see at that time how he is doing. We will continue to transition his program more towards exercises. He is performing manual therapy with his thera-cane at home. We will hold on needling at this time 2nd to active bone infection   Abnormal gait, decreased activity tolerance, decreased mobility, difficulty walking, decreased ROM, decreased strength, increased fascial restrictions, increased muscle spasms, and pain   cleaning, community activity, yard work, and shopping   Time since onset of injury/illness/exacerbation and 1-2 comorbidities: right knee injury stemming from fall; right hip replacement; multi joint OA    REHAB POTENTIAL:  Good   CLINICAL DECISION MAKING: Evolving/moderate complexity increasing flexion and forward rotation   EVALUATION COMPLEXITY: Moderate     GOALS: Goals reviewed with patient? Yes   SHORT TERM GOALS:   STG Name Target Date Goal status  1 Patient will improve standing flexion from 10-5 degrees  Baseline:  01/11/2021 Still 10 degrees  Ongoing  1/3  2 Patient will increase right hip flexion strength by 10 lbs  Baseline:  01/11/2022 Achieved 1/3  New goal 10 lbs   3 Patient will be independent with basic stretching and self trigger point release program  Baseline: 10/27/2022 Has been doing initial HEP      LONG TERM GOALS:    LTG Name Target Date Goal status  1 Patient will stand with neutral posture without increased pain or effort in order to perform daily activity  Baseline: 02/03/2022 Working towards neutral   Ongoing   2 Patient will be independent with a complete HEP including land and pool exercises  Baseline: 02/03/2022 Still working on establishing full HEP   Ongoing   3 Patient will return to golf and sporting activity without a significant increase in pain  Baseline: 12/03/2022 Has not started gol specific training   Ongoing     PLAN: PT FREQUENCY: 2x/week   PT DURATION:  weeks   PLANNED INTERVENTIONS: Therapeutic exercises, Therapeutic activity, Neuro Muscular re-education, Balance training, Gait training, Patient/Family education, Joint mobilization, Stair training, Aquatic Therapy, Dry Needling, Electrical stimulation, Cryotherapy, Taping, Ultrasound, and Manual therapy   PLAN FOR NEXT SESSION: If in the water work on stretching with movement; seated lengthening of the spine and hip strengthening; If the patient is on land, consider dry needling; soft tissue mobilization; left hip mobilization to improve extension; posterior chain strengthening ( current plan remains appropriate. Continue  with needling as tolerated/     HOME EXERCISE PROGRAM: Access Code:  [email or web address] URL: [email or web address] Date: 09/29/2021 Prepared by: Lorayne Bender Program Notes Reach up the doorway for a stretch  Exercises Reaching in Sidelying - 1 x daily - 7 x weekly - 3 sets - 10 reps Theracane Over Shoulder - 1 x daily - 7 x weekly - 3 sets - 10 reps                Dessie Coma PT DPT  02/08/2022 , 3:24 PM

## 2022-02-09 ENCOUNTER — Encounter (HOSPITAL_BASED_OUTPATIENT_CLINIC_OR_DEPARTMENT_OTHER): Payer: Self-pay | Admitting: Physical Therapy

## 2022-02-10 ENCOUNTER — Ambulatory Visit (HOSPITAL_BASED_OUTPATIENT_CLINIC_OR_DEPARTMENT_OTHER): Payer: 59 | Admitting: Physical Therapy

## 2022-02-14 DIAGNOSIS — E291 Testicular hypofunction: Secondary | ICD-10-CM | POA: Diagnosis not present

## 2022-02-15 ENCOUNTER — Ambulatory Visit (HOSPITAL_BASED_OUTPATIENT_CLINIC_OR_DEPARTMENT_OTHER): Payer: 59 | Admitting: Physical Therapy

## 2022-02-15 ENCOUNTER — Encounter (HOSPITAL_BASED_OUTPATIENT_CLINIC_OR_DEPARTMENT_OTHER): Payer: Self-pay | Admitting: Physical Therapy

## 2022-02-15 ENCOUNTER — Other Ambulatory Visit: Payer: Self-pay

## 2022-02-15 DIAGNOSIS — R2689 Other abnormalities of gait and mobility: Secondary | ICD-10-CM

## 2022-02-15 DIAGNOSIS — G8929 Other chronic pain: Secondary | ICD-10-CM

## 2022-02-15 DIAGNOSIS — R293 Abnormal posture: Secondary | ICD-10-CM | POA: Diagnosis not present

## 2022-02-15 DIAGNOSIS — M5441 Lumbago with sciatica, right side: Secondary | ICD-10-CM | POA: Diagnosis not present

## 2022-02-16 NOTE — Therapy (Addendum)
OUTPATIENT PHYSICAL THERAPY TREATMENT NOTE/discharge   Patient Name: Brandon Gordon MRN: [id number] DOB:1970-12-28, 51 y.o., male Today's Date: 2/28/ 2023  PCP: Shirline Frees, MD REFERRING PROVIDER: Shirline Frees, MD   PT End of Session - 02/15/22 1626     Visit Number 17    Number of Visits 28    Date for PT Re-Evaluation 04/05/22    PT Start Time 1519   therapy late getting out of the pool   PT Stop Time 1600    PT Time Calculation (min) 41 min    Activity Tolerance Patient tolerated treatment well    Behavior During Therapy [facility] for tasks assessed/performed              Past Medical History:  Diagnosis Date   Anxiety    At risk for sleep apnea    STOP-BANG= 5      SENT TO PCP 04-05-2016   Chronic pain syndrome    History of kidney stones    Hyperlipidemia    Hypertension    Left ureteral stone    OA (osteoarthritis)    back, hip   Pain management    Type 2 diabetes mellitus ([place])    Past Surgical History:  Procedure Laterality Date   TOTAL HIP ARTHROPLASTY Right 04-22-2011   There are no problems to display for this patient.  PCP: Shirline Frees, MD REFERRING PROVIDER: Shirline Frees, MD   PCP: Shirline Frees, MD REFERRING PROVIDER: Jean Rosenthal    SUBJECTIVE STATEMENT: The patient reports he is having much less pain just sitting around. He has been working on his exercises. He will have his 5th toe amputed next Wednesday. No pain in his foot PERTINENT HISTORY:  Anxiety, Kidney stones, DMII, HTN, Potential mensicus tear on the right    PAIN:  Are you having pain? No pain today just pain with activity  3/1 VAS scale: 2/10 when standing  Pain location: left hip  Pain orientation: Right and Left  PAIN TYPE: aching Pain description: intermittent  Aggravating factors: night time  Relieving factors: time and rest      Objective   LUMBARAROM/PROM      Today's treatment:  2/28  Chop 2x10 5 lbs  Pallof press x10 5 lbs   Shoulder extension 2x10  Row x10 10 lbs   LTR Much less motion to the right x25   Trigger point release to quadratus/ lumbar paraspinals and gluteals; reviewed self softt tissue mobilization to the anterior hip  Side lying rolling of the quad; PA mobilizations to the upper back/T-spine. Trigger point release to the upper traps.     2/21 Reviewed strength and motion measurements.  Reviewed motion measurements and improvements   Knee extension 15 lbs 3x10  Leg press 3x15 110 lbs 2x 90 lbs   Chop 2x10 5 lbs  Pallof press x10 5 lbs  Shoulder extension 2x10  Row x10 10 lbs      2/15 Trigger point release to quadratus/ lumbar paraspinals and gluteals; reviewed self softt tissue mobilization to the anterior hip  Side lying rolling of the quad; PA mobilizations to the upper back/T-spine. Trigger point release to the upper traps.   Cable row 10 lbs 3x10 tried to advance weight but had increased pain in his gluteal Cable extension 3x10   Knee extension 15 lbs 3x10  Leg press 3x10 70 lbs ( may have been too light)  2/10  Trigger point release to quadratus/ lumbar paraspinals and gluteals; reviewed self softt tissue mobilization  to the anterior hip  Side lying rolling of the quad; PA mobilizations to the upper back/T-spine. Trigger point release to the upper traps.  Supine: LTR x20  Supine hip abduction green 3x15   Supine with feet on ball for decompression   Ball press 2x10  Ball press and roll 2x10   Wand flexion 3x10, took pictures and video of the patient doing it at home.       CLINICAL IMPRESSION: The patient had some cramping when doing his exercises. He was able to stretch it out on the pull-up bar then keep going. He is progressing very well. Unfortunately he will be having an amputation next week. We will see what the MD says about return top therapy. He was encouraged to continue with his exercises.    Abnormal gait, decreased activity tolerance, decreased  mobility, difficulty walking, decreased ROM, decreased strength, increased fascial restrictions, increased muscle spasms, and pain   cleaning, community activity, yard work, and shopping   Time since onset of injury/illness/exacerbation and 1-2 comorbidities: right knee injury stemming from fall; right hip replacement; multi joint OA    REHAB POTENTIAL: Good   CLINICAL DECISION MAKING: Evolving/moderate complexity increasing flexion and forward rotation   EVALUATION COMPLEXITY: Moderate     GOALS: Goals reviewed with patient? Yes   SHORT TERM GOALS:   STG Name Target Date Goal status  1 Patient will improve standing flexion from 10-5 degrees  Baseline:  01/11/2021 Still 10 degrees  Ongoing   1/3  2 Patient will increase right hip flexion strength by 10 lbs  Baseline:  01/11/2022 Achieved 1/3  New goal 10 lbs   3 Patient will be independent with basic stretching and self trigger point release program  Baseline: 10/27/2022 Has been doing initial HEP      LONG TERM GOALS:    LTG Name Target Date Goal status  1 Patient will stand with neutral posture without increased pain or effort in order to perform daily activity  Baseline: 02/03/2022 Working towards neutral   Ongoing   2 Patient will be independent with a complete HEP including land and pool exercises  Baseline: 02/03/2022 Still working on establishing full HEP   Ongoing   3 Patient will return to golf and sporting activity without a significant increase in pain  Baseline: 12/03/2022 Has not started gol specific training   Ongoing     PLAN: PT FREQUENCY: 2x/week   PT DURATION:  weeks   PLANNED INTERVENTIONS: Therapeutic exercises, Therapeutic activity, Neuro Muscular re-education, Balance training, Gait training, Patient/Family education, Joint mobilization, Stair training, Aquatic Therapy, Dry Needling, Electrical stimulation, Cryotherapy, Taping, Ultrasound, and Manual therapy   PLAN FOR NEXT SESSION: If in the  water work on stretching with movement; seated lengthening of the spine and hip strengthening; If the patient is on land, consider dry needling; soft tissue mobilization; left hip mobilization to improve extension; posterior chain strengthening ( current plan remains appropriate. Continue  with needling as tolerated/     HOME EXERCISE PROGRAM: Access Code: [email or web address] URL: [email or web address] Date: 09/29/2021 Prepared by: Carolyne Littles Program Notes Reach up the doorway for a stretch  Exercises Reaching in Sidelying - 1 x daily - 7 x weekly - 3 sets - 10 reps Theracane Over Shoulder - 1 x daily - 7 x weekly - 3 sets - 10 reps          PHYSICAL THERAPY DISCHARGE SUMMARY  Visits from Start of Care: 17  Current functional level related to goals /  functional outcomes: Had made some improvement in posture . Was going for foot surgery   Remaining deficits: Pain with standing   Education / Equipment: HEP    Patient agrees to discharge. Patient goals were met. Patient is being discharged due to meeting the stated rehab goals.       Carney Living PT DPT  02/16/2022 , 8:30 AM

## 2022-02-17 ENCOUNTER — Ambulatory Visit (HOSPITAL_BASED_OUTPATIENT_CLINIC_OR_DEPARTMENT_OTHER): Payer: 59 | Admitting: Physical Therapy

## 2022-02-17 ENCOUNTER — Other Ambulatory Visit (HOSPITAL_COMMUNITY): Payer: Self-pay

## 2022-02-17 DIAGNOSIS — E785 Hyperlipidemia, unspecified: Secondary | ICD-10-CM | POA: Diagnosis not present

## 2022-02-17 DIAGNOSIS — E1169 Type 2 diabetes mellitus with other specified complication: Secondary | ICD-10-CM | POA: Diagnosis not present

## 2022-02-17 DIAGNOSIS — Z79899 Other long term (current) drug therapy: Secondary | ICD-10-CM | POA: Diagnosis not present

## 2022-02-17 DIAGNOSIS — E118 Type 2 diabetes mellitus with unspecified complications: Secondary | ICD-10-CM | POA: Diagnosis not present

## 2022-02-17 DIAGNOSIS — F1120 Opioid dependence, uncomplicated: Secondary | ICD-10-CM | POA: Diagnosis not present

## 2022-02-17 MED ORDER — BUPRENORPHINE HCL-NALOXONE HCL 8-2 MG SL FILM
1.0000 | ORAL_FILM | Freq: Two times a day (BID) | SUBLINGUAL | 0 refills | Status: DC
  Filled 2022-02-17 – 2022-02-18 (×2): qty 60, 30d supply, fill #0

## 2022-02-18 ENCOUNTER — Other Ambulatory Visit (HOSPITAL_COMMUNITY): Payer: Self-pay

## 2022-02-22 ENCOUNTER — Encounter (HOSPITAL_COMMUNITY): Payer: Self-pay | Admitting: Certified Registered"

## 2022-02-22 ENCOUNTER — Other Ambulatory Visit (HOSPITAL_COMMUNITY): Payer: Self-pay

## 2022-02-22 DIAGNOSIS — E11621 Type 2 diabetes mellitus with foot ulcer: Secondary | ICD-10-CM | POA: Diagnosis not present

## 2022-02-22 DIAGNOSIS — Z79899 Other long term (current) drug therapy: Secondary | ICD-10-CM | POA: Diagnosis not present

## 2022-02-22 DIAGNOSIS — E1169 Type 2 diabetes mellitus with other specified complication: Secondary | ICD-10-CM | POA: Diagnosis not present

## 2022-02-22 DIAGNOSIS — L97509 Non-pressure chronic ulcer of other part of unspecified foot with unspecified severity: Secondary | ICD-10-CM | POA: Diagnosis not present

## 2022-02-22 DIAGNOSIS — M869 Osteomyelitis, unspecified: Secondary | ICD-10-CM | POA: Diagnosis not present

## 2022-02-22 MED ORDER — CLINDAMYCIN HCL 300 MG PO CAPS
300.0000 mg | ORAL_CAPSULE | Freq: Two times a day (BID) | ORAL | 0 refills | Status: DC
  Filled 2022-02-22: qty 20, 10d supply, fill #0

## 2022-02-22 MED FILL — Semaglutide Soln Pen-inj 0.25 or 0.5 MG/DOSE (2 MG/1.5ML): SUBCUTANEOUS | 56 days supply | Qty: 3 | Fill #3 | Status: AC

## 2022-02-23 ENCOUNTER — Encounter (HOSPITAL_COMMUNITY): Admission: RE | Payer: Self-pay | Source: Home / Self Care

## 2022-02-23 ENCOUNTER — Other Ambulatory Visit (HOSPITAL_COMMUNITY): Payer: Self-pay

## 2022-02-23 ENCOUNTER — Ambulatory Visit (HOSPITAL_COMMUNITY): Admission: RE | Admit: 2022-02-23 | Payer: 59 | Source: Home / Self Care | Admitting: Orthopedic Surgery

## 2022-02-23 SURGERY — AMPUTATION, FOOT, RAY
Anesthesia: Choice | Laterality: Left

## 2022-02-23 MED ORDER — VILAZODONE HCL 40 MG PO TABS
40.0000 mg | ORAL_TABLET | Freq: Every day | ORAL | 0 refills | Status: DC
Start: 2022-02-23 — End: 2022-05-25
  Filled 2022-02-23: qty 90, 90d supply, fill #0

## 2022-02-23 MED ORDER — BUPROPION HCL ER (XL) 300 MG PO TB24
300.0000 mg | ORAL_TABLET | Freq: Every morning | ORAL | 0 refills | Status: DC
  Filled 2022-02-23: qty 90, 90d supply, fill #0

## 2022-02-24 ENCOUNTER — Other Ambulatory Visit (HOSPITAL_COMMUNITY): Payer: Self-pay

## 2022-03-02 DIAGNOSIS — Z01818 Encounter for other preprocedural examination: Secondary | ICD-10-CM | POA: Diagnosis not present

## 2022-03-02 DIAGNOSIS — Z01812 Encounter for preprocedural laboratory examination: Secondary | ICD-10-CM | POA: Diagnosis not present

## 2022-03-02 DIAGNOSIS — E11621 Type 2 diabetes mellitus with foot ulcer: Secondary | ICD-10-CM | POA: Diagnosis not present

## 2022-03-02 DIAGNOSIS — Z01810 Encounter for preprocedural cardiovascular examination: Secondary | ICD-10-CM | POA: Diagnosis not present

## 2022-03-04 ENCOUNTER — Other Ambulatory Visit (HOSPITAL_COMMUNITY): Payer: Self-pay

## 2022-03-04 DIAGNOSIS — Z7984 Long term (current) use of oral hypoglycemic drugs: Secondary | ICD-10-CM | POA: Diagnosis not present

## 2022-03-04 DIAGNOSIS — L97529 Non-pressure chronic ulcer of other part of left foot with unspecified severity: Secondary | ICD-10-CM | POA: Diagnosis not present

## 2022-03-04 DIAGNOSIS — M868X7 Other osteomyelitis, ankle and foot: Secondary | ICD-10-CM | POA: Diagnosis not present

## 2022-03-04 DIAGNOSIS — E1169 Type 2 diabetes mellitus with other specified complication: Secondary | ICD-10-CM | POA: Diagnosis not present

## 2022-03-04 DIAGNOSIS — E11621 Type 2 diabetes mellitus with foot ulcer: Secondary | ICD-10-CM | POA: Diagnosis not present

## 2022-03-04 DIAGNOSIS — L97509 Non-pressure chronic ulcer of other part of unspecified foot with unspecified severity: Secondary | ICD-10-CM | POA: Diagnosis not present

## 2022-03-04 DIAGNOSIS — M869 Osteomyelitis, unspecified: Secondary | ICD-10-CM | POA: Diagnosis not present

## 2022-03-04 DIAGNOSIS — M89372 Hypertrophy of bone, left ankle and foot: Secondary | ICD-10-CM | POA: Diagnosis not present

## 2022-03-04 MED ORDER — CLINDAMYCIN HCL 300 MG PO CAPS
300.0000 mg | ORAL_CAPSULE | Freq: Two times a day (BID) | ORAL | 0 refills | Status: DC
  Filled 2022-03-04: qty 14, 7d supply, fill #0

## 2022-03-07 ENCOUNTER — Other Ambulatory Visit (HOSPITAL_COMMUNITY): Payer: Self-pay

## 2022-03-08 ENCOUNTER — Other Ambulatory Visit (HOSPITAL_COMMUNITY): Payer: Self-pay

## 2022-03-09 ENCOUNTER — Other Ambulatory Visit (HOSPITAL_COMMUNITY): Payer: Self-pay

## 2022-03-09 MED ORDER — METFORMIN HCL ER 500 MG PO TB24
1000.0000 mg | ORAL_TABLET | Freq: Two times a day (BID) | ORAL | 0 refills | Status: DC
  Filled 2022-03-09: qty 120, 30d supply, fill #0

## 2022-03-10 ENCOUNTER — Other Ambulatory Visit (HOSPITAL_COMMUNITY): Payer: Self-pay

## 2022-03-11 ENCOUNTER — Other Ambulatory Visit (HOSPITAL_COMMUNITY): Payer: Self-pay

## 2022-03-11 MED ORDER — CLINDAMYCIN HCL 300 MG PO CAPS
300.0000 mg | ORAL_CAPSULE | Freq: Two times a day (BID) | ORAL | 0 refills | Status: DC
  Filled 2022-03-11: qty 14, 7d supply, fill #0

## 2022-03-14 ENCOUNTER — Other Ambulatory Visit (HOSPITAL_COMMUNITY): Payer: Self-pay

## 2022-03-14 DIAGNOSIS — E291 Testicular hypofunction: Secondary | ICD-10-CM | POA: Diagnosis not present

## 2022-03-14 MED ORDER — CLINDAMYCIN HCL 300 MG PO CAPS
300.0000 mg | ORAL_CAPSULE | Freq: Two times a day (BID) | ORAL | 0 refills | Status: DC
  Filled 2022-03-14: qty 14, 7d supply, fill #0

## 2022-03-14 MED ORDER — METOPROLOL SUCCINATE ER 100 MG PO TB24
100.0000 mg | ORAL_TABLET | Freq: Every day | ORAL | 0 refills | Status: DC
  Filled 2022-03-14 – 2022-03-17 (×2): qty 90, 90d supply, fill #0

## 2022-03-15 ENCOUNTER — Other Ambulatory Visit (HOSPITAL_COMMUNITY): Payer: Self-pay

## 2022-03-15 DIAGNOSIS — Z79899 Other long term (current) drug therapy: Secondary | ICD-10-CM | POA: Diagnosis not present

## 2022-03-15 DIAGNOSIS — E785 Hyperlipidemia, unspecified: Secondary | ICD-10-CM | POA: Diagnosis not present

## 2022-03-15 DIAGNOSIS — E1169 Type 2 diabetes mellitus with other specified complication: Secondary | ICD-10-CM | POA: Diagnosis not present

## 2022-03-15 DIAGNOSIS — E118 Type 2 diabetes mellitus with unspecified complications: Secondary | ICD-10-CM | POA: Diagnosis not present

## 2022-03-15 DIAGNOSIS — F1120 Opioid dependence, uncomplicated: Secondary | ICD-10-CM | POA: Diagnosis not present

## 2022-03-15 MED ORDER — BUPRENORPHINE HCL-NALOXONE HCL 8-2 MG SL FILM
1.0000 | ORAL_FILM | Freq: Two times a day (BID) | SUBLINGUAL | 0 refills | Status: DC
  Filled 2022-03-15: qty 60, 30d supply, fill #0

## 2022-03-16 ENCOUNTER — Other Ambulatory Visit (HOSPITAL_COMMUNITY): Payer: Self-pay

## 2022-03-17 ENCOUNTER — Other Ambulatory Visit (HOSPITAL_COMMUNITY): Payer: Self-pay

## 2022-03-17 DIAGNOSIS — Z79899 Other long term (current) drug therapy: Secondary | ICD-10-CM | POA: Diagnosis not present

## 2022-04-05 ENCOUNTER — Other Ambulatory Visit (HOSPITAL_COMMUNITY): Payer: Self-pay

## 2022-04-12 ENCOUNTER — Other Ambulatory Visit (HOSPITAL_COMMUNITY): Payer: Self-pay

## 2022-04-12 MED ORDER — METFORMIN HCL ER 500 MG PO TB24
1000.0000 mg | ORAL_TABLET | Freq: Two times a day (BID) | ORAL | 0 refills | Status: DC
  Filled 2022-04-12: qty 120, 30d supply, fill #0

## 2022-04-14 ENCOUNTER — Other Ambulatory Visit (HOSPITAL_COMMUNITY): Payer: Self-pay

## 2022-04-14 DIAGNOSIS — E118 Type 2 diabetes mellitus with unspecified complications: Secondary | ICD-10-CM | POA: Diagnosis not present

## 2022-04-14 DIAGNOSIS — E1169 Type 2 diabetes mellitus with other specified complication: Secondary | ICD-10-CM | POA: Diagnosis not present

## 2022-04-14 DIAGNOSIS — E785 Hyperlipidemia, unspecified: Secondary | ICD-10-CM | POA: Diagnosis not present

## 2022-04-14 DIAGNOSIS — F1120 Opioid dependence, uncomplicated: Secondary | ICD-10-CM | POA: Diagnosis not present

## 2022-04-14 DIAGNOSIS — Z79899 Other long term (current) drug therapy: Secondary | ICD-10-CM | POA: Diagnosis not present

## 2022-04-14 MED ORDER — BUPRENORPHINE HCL-NALOXONE HCL 8-2 MG SL FILM
1.0000 | ORAL_FILM | Freq: Two times a day (BID) | SUBLINGUAL | 0 refills | Status: DC
  Filled 2022-04-14: qty 60, 30d supply, fill #0

## 2022-04-18 DIAGNOSIS — Z79899 Other long term (current) drug therapy: Secondary | ICD-10-CM | POA: Diagnosis not present

## 2022-04-19 DIAGNOSIS — E291 Testicular hypofunction: Secondary | ICD-10-CM | POA: Diagnosis not present

## 2022-05-11 ENCOUNTER — Other Ambulatory Visit (HOSPITAL_COMMUNITY): Payer: Self-pay

## 2022-05-12 ENCOUNTER — Other Ambulatory Visit (HOSPITAL_COMMUNITY): Payer: Self-pay

## 2022-05-12 DIAGNOSIS — F1190 Opioid use, unspecified, uncomplicated: Secondary | ICD-10-CM | POA: Diagnosis not present

## 2022-05-12 DIAGNOSIS — F1120 Opioid dependence, uncomplicated: Secondary | ICD-10-CM | POA: Diagnosis not present

## 2022-05-12 DIAGNOSIS — E1169 Type 2 diabetes mellitus with other specified complication: Secondary | ICD-10-CM | POA: Diagnosis not present

## 2022-05-12 DIAGNOSIS — E118 Type 2 diabetes mellitus with unspecified complications: Secondary | ICD-10-CM | POA: Diagnosis not present

## 2022-05-12 DIAGNOSIS — Z79899 Other long term (current) drug therapy: Secondary | ICD-10-CM | POA: Diagnosis not present

## 2022-05-12 MED ORDER — BUPRENORPHINE HCL-NALOXONE HCL 8-2 MG SL FILM
1.0000 | ORAL_FILM | Freq: Two times a day (BID) | SUBLINGUAL | 0 refills | Status: DC
  Filled 2022-05-17: qty 60, 30d supply, fill #0

## 2022-05-13 ENCOUNTER — Other Ambulatory Visit (HOSPITAL_COMMUNITY): Payer: Self-pay

## 2022-05-13 DIAGNOSIS — Z79899 Other long term (current) drug therapy: Secondary | ICD-10-CM | POA: Diagnosis not present

## 2022-05-13 MED ORDER — METFORMIN HCL ER 500 MG PO TB24
1000.0000 mg | ORAL_TABLET | Freq: Two times a day (BID) | ORAL | 0 refills | Status: DC
  Filled 2022-05-13: qty 120, 30d supply, fill #0

## 2022-05-17 ENCOUNTER — Other Ambulatory Visit (HOSPITAL_COMMUNITY): Payer: Self-pay

## 2022-05-18 DIAGNOSIS — E291 Testicular hypofunction: Secondary | ICD-10-CM | POA: Diagnosis not present

## 2022-05-25 ENCOUNTER — Other Ambulatory Visit (HOSPITAL_COMMUNITY): Payer: Self-pay

## 2022-05-25 MED ORDER — BUPROPION HCL ER (XL) 300 MG PO TB24
300.0000 mg | ORAL_TABLET | Freq: Every morning | ORAL | 0 refills | Status: DC
  Filled 2022-05-25 (×2): qty 90, 90d supply, fill #0

## 2022-05-25 MED ORDER — VILAZODONE HCL 40 MG PO TABS
40.0000 mg | ORAL_TABLET | Freq: Every day | ORAL | 0 refills | Status: DC
  Filled 2022-05-25: qty 60, 60d supply, fill #0
  Filled 2022-05-25: qty 87, 87d supply, fill #0
  Filled 2022-05-25: qty 3, 3d supply, fill #0

## 2022-05-26 ENCOUNTER — Other Ambulatory Visit (HOSPITAL_COMMUNITY): Payer: Self-pay

## 2022-05-27 ENCOUNTER — Other Ambulatory Visit (HOSPITAL_COMMUNITY): Payer: Self-pay

## 2022-05-27 DIAGNOSIS — E291 Testicular hypofunction: Secondary | ICD-10-CM | POA: Diagnosis not present

## 2022-05-27 DIAGNOSIS — G609 Hereditary and idiopathic neuropathy, unspecified: Secondary | ICD-10-CM | POA: Diagnosis not present

## 2022-05-27 DIAGNOSIS — F419 Anxiety disorder, unspecified: Secondary | ICD-10-CM | POA: Diagnosis not present

## 2022-05-27 DIAGNOSIS — E782 Mixed hyperlipidemia: Secondary | ICD-10-CM | POA: Diagnosis not present

## 2022-05-27 DIAGNOSIS — Z125 Encounter for screening for malignant neoplasm of prostate: Secondary | ICD-10-CM | POA: Diagnosis not present

## 2022-05-27 DIAGNOSIS — I10 Essential (primary) hypertension: Secondary | ICD-10-CM | POA: Diagnosis not present

## 2022-05-27 DIAGNOSIS — E1165 Type 2 diabetes mellitus with hyperglycemia: Secondary | ICD-10-CM | POA: Diagnosis not present

## 2022-05-27 MED ORDER — ATORVASTATIN CALCIUM 40 MG PO TABS
40.0000 mg | ORAL_TABLET | Freq: Every day | ORAL | 3 refills | Status: DC
  Filled 2022-05-27: qty 90, 90d supply, fill #0
  Filled 2022-08-26: qty 90, 90d supply, fill #1
  Filled 2022-11-14: qty 90, 90d supply, fill #2
  Filled 2023-02-17: qty 90, 90d supply, fill #3

## 2022-06-01 ENCOUNTER — Other Ambulatory Visit (HOSPITAL_COMMUNITY): Payer: Self-pay

## 2022-06-02 ENCOUNTER — Other Ambulatory Visit (HOSPITAL_COMMUNITY): Payer: Self-pay

## 2022-06-02 MED ORDER — OZEMPIC (0.25 OR 0.5 MG/DOSE) 2 MG/3ML ~~LOC~~ SOPN
0.5000 mg | PEN_INJECTOR | SUBCUTANEOUS | 0 refills | Status: DC
  Filled 2022-06-02: qty 3, 28d supply, fill #0

## 2022-06-03 ENCOUNTER — Other Ambulatory Visit (HOSPITAL_COMMUNITY): Payer: Self-pay

## 2022-06-06 ENCOUNTER — Other Ambulatory Visit (HOSPITAL_COMMUNITY): Payer: Self-pay

## 2022-06-07 ENCOUNTER — Other Ambulatory Visit (HOSPITAL_COMMUNITY): Payer: Self-pay

## 2022-06-07 MED ORDER — OZEMPIC (0.25 OR 0.5 MG/DOSE) 2 MG/3ML ~~LOC~~ SOPN
0.5000 mg | PEN_INJECTOR | SUBCUTANEOUS | 0 refills | Status: DC
  Filled 2022-06-07 – 2022-07-06 (×2): qty 3, 28d supply, fill #0

## 2022-06-08 ENCOUNTER — Other Ambulatory Visit (HOSPITAL_COMMUNITY): Payer: Self-pay

## 2022-06-09 ENCOUNTER — Other Ambulatory Visit (HOSPITAL_COMMUNITY): Payer: Self-pay

## 2022-06-09 MED ORDER — BUPROPION HCL ER (XL) 300 MG PO TB24
300.0000 mg | ORAL_TABLET | Freq: Every morning | ORAL | 1 refills | Status: DC
  Filled 2022-06-09 – 2022-08-16 (×2): qty 90, 90d supply, fill #0
  Filled 2023-02-13: qty 90, 90d supply, fill #1

## 2022-06-09 MED ORDER — METOPROLOL SUCCINATE ER 100 MG PO TB24
100.0000 mg | ORAL_TABLET | Freq: Every day | ORAL | 0 refills | Status: DC
  Filled 2022-06-09: qty 90, 90d supply, fill #0

## 2022-06-09 MED ORDER — METFORMIN HCL ER 500 MG PO TB24
1000.0000 mg | ORAL_TABLET | Freq: Two times a day (BID) | ORAL | 0 refills | Status: DC
  Filled 2022-06-09: qty 120, 30d supply, fill #0

## 2022-06-10 ENCOUNTER — Other Ambulatory Visit (HOSPITAL_COMMUNITY): Payer: Self-pay

## 2022-06-15 DIAGNOSIS — Z79899 Other long term (current) drug therapy: Secondary | ICD-10-CM | POA: Diagnosis not present

## 2022-06-15 DIAGNOSIS — E118 Type 2 diabetes mellitus with unspecified complications: Secondary | ICD-10-CM | POA: Diagnosis not present

## 2022-06-15 DIAGNOSIS — F1190 Opioid use, unspecified, uncomplicated: Secondary | ICD-10-CM | POA: Diagnosis not present

## 2022-06-15 DIAGNOSIS — F419 Anxiety disorder, unspecified: Secondary | ICD-10-CM | POA: Diagnosis not present

## 2022-06-15 DIAGNOSIS — F1120 Opioid dependence, uncomplicated: Secondary | ICD-10-CM | POA: Diagnosis not present

## 2022-06-16 ENCOUNTER — Other Ambulatory Visit (HOSPITAL_COMMUNITY): Payer: Self-pay

## 2022-06-17 ENCOUNTER — Other Ambulatory Visit (HOSPITAL_COMMUNITY): Payer: Self-pay

## 2022-06-17 DIAGNOSIS — E291 Testicular hypofunction: Secondary | ICD-10-CM | POA: Diagnosis not present

## 2022-06-17 DIAGNOSIS — Z79899 Other long term (current) drug therapy: Secondary | ICD-10-CM | POA: Diagnosis not present

## 2022-06-17 MED ORDER — BUPRENORPHINE HCL-NALOXONE HCL 8-2 MG SL FILM
ORAL_FILM | SUBLINGUAL | 0 refills | Status: DC
  Filled 2022-06-17: qty 60, 30d supply, fill #0

## 2022-07-06 ENCOUNTER — Other Ambulatory Visit (HOSPITAL_COMMUNITY): Payer: Self-pay

## 2022-07-06 MED ORDER — METFORMIN HCL ER 500 MG PO TB24
1000.0000 mg | ORAL_TABLET | Freq: Two times a day (BID) | ORAL | 5 refills | Status: DC
  Filled 2022-07-06: qty 120, 30d supply, fill #0
  Filled 2022-08-10: qty 120, 30d supply, fill #1
  Filled 2022-09-05: qty 120, 30d supply, fill #2
  Filled 2022-10-05 – 2022-10-17 (×2): qty 120, 30d supply, fill #3
  Filled 2022-11-14: qty 120, 30d supply, fill #4
  Filled 2023-03-15: qty 120, 30d supply, fill #5

## 2022-07-07 ENCOUNTER — Other Ambulatory Visit (HOSPITAL_COMMUNITY): Payer: Self-pay

## 2022-07-07 MED ORDER — GABAPENTIN 600 MG PO TABS
600.0000 mg | ORAL_TABLET | Freq: Two times a day (BID) | ORAL | 0 refills | Status: DC
  Filled 2022-07-07: qty 180, 90d supply, fill #0

## 2022-07-08 ENCOUNTER — Other Ambulatory Visit (HOSPITAL_COMMUNITY): Payer: Self-pay

## 2022-07-15 ENCOUNTER — Other Ambulatory Visit (HOSPITAL_COMMUNITY): Payer: Self-pay

## 2022-07-15 DIAGNOSIS — G8929 Other chronic pain: Secondary | ICD-10-CM | POA: Diagnosis not present

## 2022-07-15 DIAGNOSIS — Z5181 Encounter for therapeutic drug level monitoring: Secondary | ICD-10-CM | POA: Diagnosis not present

## 2022-07-15 DIAGNOSIS — R030 Elevated blood-pressure reading, without diagnosis of hypertension: Secondary | ICD-10-CM | POA: Diagnosis not present

## 2022-07-15 DIAGNOSIS — Z79899 Other long term (current) drug therapy: Secondary | ICD-10-CM | POA: Diagnosis not present

## 2022-07-15 DIAGNOSIS — F1120 Opioid dependence, uncomplicated: Secondary | ICD-10-CM | POA: Diagnosis not present

## 2022-07-15 DIAGNOSIS — M5442 Lumbago with sciatica, left side: Secondary | ICD-10-CM | POA: Diagnosis not present

## 2022-07-15 DIAGNOSIS — F1190 Opioid use, unspecified, uncomplicated: Secondary | ICD-10-CM | POA: Diagnosis not present

## 2022-07-15 DIAGNOSIS — Z6829 Body mass index (BMI) 29.0-29.9, adult: Secondary | ICD-10-CM | POA: Diagnosis not present

## 2022-07-15 DIAGNOSIS — M5441 Lumbago with sciatica, right side: Secondary | ICD-10-CM | POA: Diagnosis not present

## 2022-07-15 MED ORDER — BUPRENORPHINE HCL-NALOXONE HCL 8-2 MG SL FILM
1.0000 | ORAL_FILM | Freq: Two times a day (BID) | SUBLINGUAL | 0 refills | Status: DC
  Filled 2022-07-18: qty 60, 30d supply, fill #0

## 2022-07-18 ENCOUNTER — Other Ambulatory Visit (HOSPITAL_COMMUNITY): Payer: Self-pay

## 2022-07-18 DIAGNOSIS — Z79899 Other long term (current) drug therapy: Secondary | ICD-10-CM | POA: Diagnosis not present

## 2022-07-20 DIAGNOSIS — E291 Testicular hypofunction: Secondary | ICD-10-CM | POA: Diagnosis not present

## 2022-07-25 ENCOUNTER — Other Ambulatory Visit (HOSPITAL_COMMUNITY): Payer: Self-pay

## 2022-08-03 ENCOUNTER — Other Ambulatory Visit (HOSPITAL_COMMUNITY): Payer: Self-pay

## 2022-08-10 ENCOUNTER — Other Ambulatory Visit (HOSPITAL_COMMUNITY): Payer: Self-pay

## 2022-08-14 DIAGNOSIS — G8929 Other chronic pain: Secondary | ICD-10-CM | POA: Diagnosis not present

## 2022-08-14 DIAGNOSIS — Z6830 Body mass index (BMI) 30.0-30.9, adult: Secondary | ICD-10-CM | POA: Diagnosis not present

## 2022-08-14 DIAGNOSIS — E118 Type 2 diabetes mellitus with unspecified complications: Secondary | ICD-10-CM | POA: Diagnosis not present

## 2022-08-14 DIAGNOSIS — M5442 Lumbago with sciatica, left side: Secondary | ICD-10-CM | POA: Diagnosis not present

## 2022-08-14 DIAGNOSIS — Z79899 Other long term (current) drug therapy: Secondary | ICD-10-CM | POA: Diagnosis not present

## 2022-08-14 DIAGNOSIS — F1190 Opioid use, unspecified, uncomplicated: Secondary | ICD-10-CM | POA: Diagnosis not present

## 2022-08-14 DIAGNOSIS — I10 Essential (primary) hypertension: Secondary | ICD-10-CM | POA: Diagnosis not present

## 2022-08-14 DIAGNOSIS — Z0130 Encounter for examination of blood pressure without abnormal findings: Secondary | ICD-10-CM | POA: Diagnosis not present

## 2022-08-14 DIAGNOSIS — M5441 Lumbago with sciatica, right side: Secondary | ICD-10-CM | POA: Diagnosis not present

## 2022-08-14 DIAGNOSIS — F1120 Opioid dependence, uncomplicated: Secondary | ICD-10-CM | POA: Diagnosis not present

## 2022-08-16 ENCOUNTER — Other Ambulatory Visit (HOSPITAL_COMMUNITY): Payer: Self-pay

## 2022-08-17 ENCOUNTER — Other Ambulatory Visit (HOSPITAL_COMMUNITY): Payer: Self-pay

## 2022-08-18 ENCOUNTER — Other Ambulatory Visit (HOSPITAL_COMMUNITY): Payer: Self-pay

## 2022-08-18 MED ORDER — OZEMPIC (0.25 OR 0.5 MG/DOSE) 2 MG/3ML ~~LOC~~ SOPN
0.5000 mg | PEN_INJECTOR | SUBCUTANEOUS | 0 refills | Status: DC
  Filled 2022-08-18: qty 3, 28d supply, fill #0

## 2022-08-18 MED ORDER — BUPRENORPHINE HCL-NALOXONE HCL 8-2 MG SL FILM
1.0000 | ORAL_FILM | Freq: Two times a day (BID) | SUBLINGUAL | 0 refills | Status: DC
  Filled 2022-08-18: qty 60, 30d supply, fill #0

## 2022-08-19 ENCOUNTER — Other Ambulatory Visit (HOSPITAL_COMMUNITY): Payer: Self-pay

## 2022-08-19 MED ORDER — VILAZODONE HCL 40 MG PO TABS
40.0000 mg | ORAL_TABLET | Freq: Every day | ORAL | 0 refills | Status: DC
  Filled 2022-08-19: qty 63, 63d supply, fill #0
  Filled 2022-08-19: qty 60, 60d supply, fill #0
  Filled 2022-08-19: qty 30, 30d supply, fill #0
  Filled 2022-08-19: qty 27, 27d supply, fill #0

## 2022-08-20 ENCOUNTER — Other Ambulatory Visit (HOSPITAL_COMMUNITY): Payer: Self-pay

## 2022-08-26 ENCOUNTER — Other Ambulatory Visit (HOSPITAL_COMMUNITY): Payer: Self-pay

## 2022-08-26 MED ORDER — GABAPENTIN 600 MG PO TABS
600.0000 mg | ORAL_TABLET | Freq: Four times a day (QID) | ORAL | 3 refills | Status: DC
  Filled 2022-08-26: qty 360, 90d supply, fill #0
  Filled 2022-11-14: qty 360, 90d supply, fill #1
  Filled 2023-02-17: qty 360, 90d supply, fill #2
  Filled 2023-05-30: qty 360, 90d supply, fill #3

## 2022-09-05 ENCOUNTER — Other Ambulatory Visit (HOSPITAL_COMMUNITY): Payer: Self-pay

## 2022-09-07 ENCOUNTER — Other Ambulatory Visit (HOSPITAL_COMMUNITY): Payer: Self-pay

## 2022-09-09 ENCOUNTER — Other Ambulatory Visit (HOSPITAL_COMMUNITY): Payer: Self-pay

## 2022-09-09 DIAGNOSIS — E291 Testicular hypofunction: Secondary | ICD-10-CM | POA: Diagnosis not present

## 2022-09-09 MED ORDER — METOPROLOL SUCCINATE ER 100 MG PO TB24
100.0000 mg | ORAL_TABLET | Freq: Every day | ORAL | 0 refills | Status: DC
  Filled 2022-09-09: qty 90, 90d supply, fill #0

## 2022-09-15 ENCOUNTER — Other Ambulatory Visit (HOSPITAL_COMMUNITY): Payer: Self-pay

## 2022-09-15 DIAGNOSIS — M545 Low back pain, unspecified: Secondary | ICD-10-CM | POA: Diagnosis not present

## 2022-09-15 DIAGNOSIS — G8929 Other chronic pain: Secondary | ICD-10-CM | POA: Diagnosis not present

## 2022-09-15 DIAGNOSIS — M5441 Lumbago with sciatica, right side: Secondary | ICD-10-CM | POA: Diagnosis not present

## 2022-09-15 DIAGNOSIS — E118 Type 2 diabetes mellitus with unspecified complications: Secondary | ICD-10-CM | POA: Diagnosis not present

## 2022-09-15 DIAGNOSIS — F1120 Opioid dependence, uncomplicated: Secondary | ICD-10-CM | POA: Diagnosis not present

## 2022-09-15 DIAGNOSIS — I10 Essential (primary) hypertension: Secondary | ICD-10-CM | POA: Diagnosis not present

## 2022-09-15 DIAGNOSIS — R030 Elevated blood-pressure reading, without diagnosis of hypertension: Secondary | ICD-10-CM | POA: Diagnosis not present

## 2022-09-15 DIAGNOSIS — Z79899 Other long term (current) drug therapy: Secondary | ICD-10-CM | POA: Diagnosis not present

## 2022-09-15 DIAGNOSIS — F1190 Opioid use, unspecified, uncomplicated: Secondary | ICD-10-CM | POA: Diagnosis not present

## 2022-09-15 DIAGNOSIS — Z6830 Body mass index (BMI) 30.0-30.9, adult: Secondary | ICD-10-CM | POA: Diagnosis not present

## 2022-09-15 MED ORDER — BUPRENORPHINE HCL-NALOXONE HCL 8-2 MG SL FILM
ORAL_FILM | SUBLINGUAL | 0 refills | Status: DC
  Filled 2022-09-15: qty 60, 30d supply, fill #0

## 2022-09-26 ENCOUNTER — Other Ambulatory Visit (HOSPITAL_COMMUNITY): Payer: Self-pay

## 2022-09-28 ENCOUNTER — Other Ambulatory Visit (HOSPITAL_COMMUNITY): Payer: Self-pay

## 2022-09-29 ENCOUNTER — Other Ambulatory Visit (HOSPITAL_COMMUNITY): Payer: Self-pay

## 2022-09-30 ENCOUNTER — Other Ambulatory Visit (HOSPITAL_COMMUNITY): Payer: Self-pay

## 2022-09-30 MED ORDER — OZEMPIC (0.25 OR 0.5 MG/DOSE) 2 MG/3ML ~~LOC~~ SOPN
0.5000 mg | PEN_INJECTOR | SUBCUTANEOUS | 5 refills | Status: DC
  Filled 2022-09-30: qty 3, 28d supply, fill #0
  Filled 2022-11-28: qty 3, 28d supply, fill #1
  Filled 2023-07-06: qty 3, 28d supply, fill #2
  Filled 2023-09-06: qty 3, 28d supply, fill #3

## 2022-10-05 ENCOUNTER — Other Ambulatory Visit (HOSPITAL_COMMUNITY): Payer: Self-pay

## 2022-10-06 ENCOUNTER — Other Ambulatory Visit (HOSPITAL_COMMUNITY): Payer: Self-pay

## 2022-10-06 MED ORDER — BUPROPION HCL ER (XL) 300 MG PO TB24
300.0000 mg | ORAL_TABLET | Freq: Every morning | ORAL | 0 refills | Status: DC
  Filled 2022-10-06 – 2022-11-14 (×2): qty 90, 90d supply, fill #0

## 2022-10-07 ENCOUNTER — Other Ambulatory Visit (HOSPITAL_COMMUNITY): Payer: Self-pay

## 2022-10-13 DIAGNOSIS — Z79899 Other long term (current) drug therapy: Secondary | ICD-10-CM | POA: Diagnosis not present

## 2022-10-13 DIAGNOSIS — I10 Essential (primary) hypertension: Secondary | ICD-10-CM | POA: Diagnosis not present

## 2022-10-13 DIAGNOSIS — M5441 Lumbago with sciatica, right side: Secondary | ICD-10-CM | POA: Diagnosis not present

## 2022-10-13 DIAGNOSIS — Z6830 Body mass index (BMI) 30.0-30.9, adult: Secondary | ICD-10-CM | POA: Diagnosis not present

## 2022-10-13 DIAGNOSIS — R030 Elevated blood-pressure reading, without diagnosis of hypertension: Secondary | ICD-10-CM | POA: Diagnosis not present

## 2022-10-13 DIAGNOSIS — M545 Low back pain, unspecified: Secondary | ICD-10-CM | POA: Diagnosis not present

## 2022-10-13 DIAGNOSIS — F1120 Opioid dependence, uncomplicated: Secondary | ICD-10-CM | POA: Diagnosis not present

## 2022-10-13 DIAGNOSIS — F1190 Opioid use, unspecified, uncomplicated: Secondary | ICD-10-CM | POA: Diagnosis not present

## 2022-10-13 DIAGNOSIS — M5442 Lumbago with sciatica, left side: Secondary | ICD-10-CM | POA: Diagnosis not present

## 2022-10-13 DIAGNOSIS — G8929 Other chronic pain: Secondary | ICD-10-CM | POA: Diagnosis not present

## 2022-10-14 ENCOUNTER — Other Ambulatory Visit (HOSPITAL_COMMUNITY): Payer: Self-pay

## 2022-10-14 DIAGNOSIS — Z79899 Other long term (current) drug therapy: Secondary | ICD-10-CM | POA: Diagnosis not present

## 2022-10-17 ENCOUNTER — Other Ambulatory Visit (HOSPITAL_COMMUNITY): Payer: Self-pay

## 2022-10-17 DIAGNOSIS — E291 Testicular hypofunction: Secondary | ICD-10-CM | POA: Diagnosis not present

## 2022-11-11 DIAGNOSIS — Z79899 Other long term (current) drug therapy: Secondary | ICD-10-CM | POA: Diagnosis not present

## 2022-11-11 DIAGNOSIS — F419 Anxiety disorder, unspecified: Secondary | ICD-10-CM | POA: Diagnosis not present

## 2022-11-11 DIAGNOSIS — R030 Elevated blood-pressure reading, without diagnosis of hypertension: Secondary | ICD-10-CM | POA: Diagnosis not present

## 2022-11-11 DIAGNOSIS — Z6830 Body mass index (BMI) 30.0-30.9, adult: Secondary | ICD-10-CM | POA: Diagnosis not present

## 2022-11-11 DIAGNOSIS — M5442 Lumbago with sciatica, left side: Secondary | ICD-10-CM | POA: Diagnosis not present

## 2022-11-11 DIAGNOSIS — E118 Type 2 diabetes mellitus with unspecified complications: Secondary | ICD-10-CM | POA: Diagnosis not present

## 2022-11-11 DIAGNOSIS — N1831 Chronic kidney disease, stage 3a: Secondary | ICD-10-CM | POA: Diagnosis not present

## 2022-11-11 DIAGNOSIS — F32A Depression, unspecified: Secondary | ICD-10-CM | POA: Diagnosis not present

## 2022-11-11 DIAGNOSIS — M5441 Lumbago with sciatica, right side: Secondary | ICD-10-CM | POA: Diagnosis not present

## 2022-11-11 DIAGNOSIS — I10 Essential (primary) hypertension: Secondary | ICD-10-CM | POA: Diagnosis not present

## 2022-11-14 ENCOUNTER — Other Ambulatory Visit (HOSPITAL_COMMUNITY): Payer: Self-pay

## 2022-11-14 DIAGNOSIS — Z79899 Other long term (current) drug therapy: Secondary | ICD-10-CM | POA: Diagnosis not present

## 2022-11-15 ENCOUNTER — Other Ambulatory Visit (HOSPITAL_COMMUNITY): Payer: Self-pay

## 2022-11-15 MED ORDER — VILAZODONE HCL 40 MG PO TABS
40.0000 mg | ORAL_TABLET | Freq: Every day | ORAL | 0 refills | Status: DC
  Filled 2022-11-15: qty 90, 90d supply, fill #0

## 2022-11-15 MED ORDER — METOPROLOL SUCCINATE ER 100 MG PO TB24
100.0000 mg | ORAL_TABLET | Freq: Every day | ORAL | 0 refills | Status: DC
  Filled 2022-11-15 – 2022-12-07 (×2): qty 90, 90d supply, fill #0

## 2022-11-16 ENCOUNTER — Other Ambulatory Visit (HOSPITAL_COMMUNITY): Payer: Self-pay

## 2022-11-21 ENCOUNTER — Other Ambulatory Visit (HOSPITAL_COMMUNITY): Payer: Self-pay

## 2022-11-21 MED ORDER — BUPRENORPHINE HCL-NALOXONE HCL 8-2 MG SL FILM
1.0000 | ORAL_FILM | Freq: Two times a day (BID) | SUBLINGUAL | 0 refills | Status: DC
  Filled 2022-11-21: qty 60, 30d supply, fill #0

## 2022-11-23 ENCOUNTER — Other Ambulatory Visit (HOSPITAL_COMMUNITY): Payer: Self-pay

## 2022-11-24 ENCOUNTER — Other Ambulatory Visit (HOSPITAL_COMMUNITY): Payer: Self-pay

## 2022-11-28 ENCOUNTER — Other Ambulatory Visit (HOSPITAL_COMMUNITY): Payer: Self-pay

## 2022-12-01 DIAGNOSIS — E291 Testicular hypofunction: Secondary | ICD-10-CM | POA: Diagnosis not present

## 2022-12-07 ENCOUNTER — Other Ambulatory Visit (HOSPITAL_COMMUNITY): Payer: Self-pay

## 2022-12-07 ENCOUNTER — Other Ambulatory Visit: Payer: Self-pay

## 2022-12-09 ENCOUNTER — Other Ambulatory Visit (HOSPITAL_COMMUNITY): Payer: Self-pay

## 2022-12-09 DIAGNOSIS — Z125 Encounter for screening for malignant neoplasm of prostate: Secondary | ICD-10-CM | POA: Diagnosis not present

## 2022-12-09 DIAGNOSIS — G8929 Other chronic pain: Secondary | ICD-10-CM | POA: Diagnosis not present

## 2022-12-09 DIAGNOSIS — F1120 Opioid dependence, uncomplicated: Secondary | ICD-10-CM | POA: Diagnosis not present

## 2022-12-09 DIAGNOSIS — Z79899 Other long term (current) drug therapy: Secondary | ICD-10-CM | POA: Diagnosis not present

## 2022-12-09 DIAGNOSIS — N1831 Chronic kidney disease, stage 3a: Secondary | ICD-10-CM | POA: Diagnosis not present

## 2022-12-09 DIAGNOSIS — I10 Essential (primary) hypertension: Secondary | ICD-10-CM | POA: Diagnosis not present

## 2022-12-09 DIAGNOSIS — Z114 Encounter for screening for human immunodeficiency virus [HIV]: Secondary | ICD-10-CM | POA: Diagnosis not present

## 2022-12-09 DIAGNOSIS — Z1159 Encounter for screening for other viral diseases: Secondary | ICD-10-CM | POA: Diagnosis not present

## 2022-12-09 DIAGNOSIS — E118 Type 2 diabetes mellitus with unspecified complications: Secondary | ICD-10-CM | POA: Diagnosis not present

## 2022-12-09 DIAGNOSIS — M5442 Lumbago with sciatica, left side: Secondary | ICD-10-CM | POA: Diagnosis not present

## 2022-12-09 DIAGNOSIS — M5441 Lumbago with sciatica, right side: Secondary | ICD-10-CM | POA: Diagnosis not present

## 2022-12-09 DIAGNOSIS — F1190 Opioid use, unspecified, uncomplicated: Secondary | ICD-10-CM | POA: Diagnosis not present

## 2022-12-09 DIAGNOSIS — M545 Low back pain, unspecified: Secondary | ICD-10-CM | POA: Diagnosis not present

## 2022-12-09 MED ORDER — BUPRENORPHINE HCL-NALOXONE HCL 8-2 MG SL FILM
1.0000 | ORAL_FILM | Freq: Two times a day (BID) | SUBLINGUAL | 0 refills | Status: DC
  Filled 2022-12-09 – 2022-12-21 (×5): qty 60, 30d supply, fill #0

## 2022-12-14 DIAGNOSIS — Z79899 Other long term (current) drug therapy: Secondary | ICD-10-CM | POA: Diagnosis not present

## 2022-12-20 ENCOUNTER — Other Ambulatory Visit (HOSPITAL_COMMUNITY): Payer: Self-pay

## 2022-12-20 DIAGNOSIS — E291 Testicular hypofunction: Secondary | ICD-10-CM | POA: Diagnosis not present

## 2022-12-20 DIAGNOSIS — R946 Abnormal results of thyroid function studies: Secondary | ICD-10-CM | POA: Diagnosis not present

## 2022-12-20 DIAGNOSIS — E782 Mixed hyperlipidemia: Secondary | ICD-10-CM | POA: Diagnosis not present

## 2022-12-20 DIAGNOSIS — I10 Essential (primary) hypertension: Secondary | ICD-10-CM | POA: Diagnosis not present

## 2022-12-20 DIAGNOSIS — E1165 Type 2 diabetes mellitus with hyperglycemia: Secondary | ICD-10-CM | POA: Diagnosis not present

## 2022-12-20 DIAGNOSIS — F419 Anxiety disorder, unspecified: Secondary | ICD-10-CM | POA: Diagnosis not present

## 2022-12-20 DIAGNOSIS — G609 Hereditary and idiopathic neuropathy, unspecified: Secondary | ICD-10-CM | POA: Diagnosis not present

## 2022-12-20 MED ORDER — OZEMPIC (0.25 OR 0.5 MG/DOSE) 2 MG/3ML ~~LOC~~ SOPN
0.5000 mg | PEN_INJECTOR | SUBCUTANEOUS | 5 refills | Status: DC
  Filled 2022-12-20 – 2022-12-22 (×2): qty 3, 28d supply, fill #0
  Filled 2023-01-23: qty 3, 28d supply, fill #1
  Filled 2023-02-22: qty 3, 28d supply, fill #2
  Filled 2023-03-27: qty 3, 28d supply, fill #3
  Filled 2023-05-02: qty 3, 28d supply, fill #4
  Filled 2023-05-30: qty 3, 28d supply, fill #5

## 2022-12-20 MED ORDER — METFORMIN HCL ER 500 MG PO TB24
1000.0000 mg | ORAL_TABLET | Freq: Two times a day (BID) | ORAL | 3 refills | Status: DC
  Filled 2022-12-20: qty 360, 90d supply, fill #0
  Filled 2023-03-15 – 2023-04-14 (×2): qty 360, 90d supply, fill #1
  Filled 2023-07-14: qty 360, 90d supply, fill #2
  Filled 2023-10-02: qty 360, 90d supply, fill #3

## 2022-12-21 ENCOUNTER — Other Ambulatory Visit: Payer: Self-pay

## 2022-12-21 ENCOUNTER — Other Ambulatory Visit (HOSPITAL_COMMUNITY): Payer: Self-pay

## 2022-12-21 MED ORDER — LEVOTHYROXINE SODIUM 25 MCG PO TABS
25.0000 ug | ORAL_TABLET | Freq: Every day | ORAL | 11 refills | Status: DC
  Filled 2022-12-21: qty 30, 30d supply, fill #0
  Filled 2023-01-23: qty 30, 30d supply, fill #1
  Filled 2023-02-22: qty 30, 30d supply, fill #2
  Filled 2023-04-14: qty 30, 30d supply, fill #3
  Filled 2023-05-13: qty 30, 30d supply, fill #4
  Filled 2023-07-06: qty 90, 90d supply, fill #5
  Filled 2023-10-02: qty 90, 90d supply, fill #6

## 2022-12-22 ENCOUNTER — Other Ambulatory Visit: Payer: Self-pay

## 2022-12-22 ENCOUNTER — Other Ambulatory Visit (HOSPITAL_COMMUNITY): Payer: Self-pay

## 2023-01-11 DIAGNOSIS — E291 Testicular hypofunction: Secondary | ICD-10-CM | POA: Diagnosis not present

## 2023-01-16 ENCOUNTER — Other Ambulatory Visit (HOSPITAL_COMMUNITY): Payer: Self-pay

## 2023-01-16 DIAGNOSIS — Z79899 Other long term (current) drug therapy: Secondary | ICD-10-CM | POA: Diagnosis not present

## 2023-01-16 MED ORDER — BUPRENORPHINE HCL-NALOXONE HCL 8-2 MG SL FILM
1.0000 | ORAL_FILM | Freq: Two times a day (BID) | SUBLINGUAL | 0 refills | Status: DC
  Filled 2023-01-20: qty 60, 30d supply, fill #0

## 2023-01-17 ENCOUNTER — Other Ambulatory Visit (HOSPITAL_COMMUNITY): Payer: Self-pay

## 2023-01-20 ENCOUNTER — Other Ambulatory Visit (HOSPITAL_COMMUNITY): Payer: Self-pay

## 2023-01-30 ENCOUNTER — Other Ambulatory Visit (HOSPITAL_COMMUNITY): Payer: Self-pay

## 2023-02-10 DIAGNOSIS — E291 Testicular hypofunction: Secondary | ICD-10-CM | POA: Diagnosis not present

## 2023-02-13 ENCOUNTER — Other Ambulatory Visit (HOSPITAL_COMMUNITY): Payer: Self-pay

## 2023-02-13 MED ORDER — VILAZODONE HCL 40 MG PO TABS
40.0000 mg | ORAL_TABLET | Freq: Every day | ORAL | 0 refills | Status: DC
  Filled 2023-02-13: qty 90, 90d supply, fill #0

## 2023-02-16 ENCOUNTER — Other Ambulatory Visit (HOSPITAL_COMMUNITY): Payer: Self-pay

## 2023-02-16 DIAGNOSIS — Z79899 Other long term (current) drug therapy: Secondary | ICD-10-CM | POA: Diagnosis not present

## 2023-02-16 MED ORDER — NALOXONE HCL 4 MG/0.1ML NA LIQD
1.0000 | NASAL | 3 refills | Status: DC
  Filled 2023-02-16: qty 2, 15d supply, fill #0
  Filled 2023-03-15 – 2023-05-13 (×2): qty 2, 15d supply, fill #1

## 2023-02-16 MED ORDER — LOSARTAN POTASSIUM 25 MG PO TABS
25.0000 mg | ORAL_TABLET | Freq: Every day | ORAL | 1 refills | Status: DC
  Filled 2023-02-16: qty 90, 90d supply, fill #0
  Filled 2023-03-15 – 2023-05-13 (×2): qty 90, 90d supply, fill #1

## 2023-02-16 MED ORDER — BUPRENORPHINE HCL-NALOXONE HCL 8-2 MG SL FILM
1.0000 | ORAL_FILM | Freq: Two times a day (BID) | SUBLINGUAL | 0 refills | Status: DC
  Filled 2023-02-16 – 2023-02-17 (×2): qty 60, 30d supply, fill #0

## 2023-02-17 ENCOUNTER — Other Ambulatory Visit (HOSPITAL_COMMUNITY): Payer: Self-pay

## 2023-03-14 DIAGNOSIS — E039 Hypothyroidism, unspecified: Secondary | ICD-10-CM | POA: Diagnosis not present

## 2023-03-14 DIAGNOSIS — E291 Testicular hypofunction: Secondary | ICD-10-CM | POA: Diagnosis not present

## 2023-03-15 ENCOUNTER — Other Ambulatory Visit (HOSPITAL_COMMUNITY): Payer: Self-pay

## 2023-03-15 ENCOUNTER — Other Ambulatory Visit: Payer: Self-pay

## 2023-03-16 ENCOUNTER — Other Ambulatory Visit (HOSPITAL_COMMUNITY): Payer: Self-pay

## 2023-03-16 DIAGNOSIS — Z79899 Other long term (current) drug therapy: Secondary | ICD-10-CM | POA: Diagnosis not present

## 2023-03-16 MED ORDER — BUPRENORPHINE HCL-NALOXONE HCL 8-2 MG SL FILM
1.0000 | ORAL_FILM | Freq: Two times a day (BID) | SUBLINGUAL | 0 refills | Status: DC
  Filled 2023-03-21: qty 60, 30d supply, fill #0

## 2023-03-17 ENCOUNTER — Other Ambulatory Visit (HOSPITAL_COMMUNITY): Payer: Self-pay

## 2023-03-17 ENCOUNTER — Other Ambulatory Visit: Payer: Self-pay

## 2023-03-17 MED ORDER — METOPROLOL SUCCINATE ER 100 MG PO TB24
100.0000 mg | ORAL_TABLET | Freq: Every day | ORAL | 0 refills | Status: DC
  Filled 2023-03-17: qty 90, 90d supply, fill #0

## 2023-03-21 ENCOUNTER — Other Ambulatory Visit (HOSPITAL_COMMUNITY): Payer: Self-pay

## 2023-03-27 ENCOUNTER — Other Ambulatory Visit (HOSPITAL_COMMUNITY): Payer: Self-pay

## 2023-04-14 ENCOUNTER — Other Ambulatory Visit (HOSPITAL_COMMUNITY): Payer: Self-pay

## 2023-04-16 DIAGNOSIS — E118 Type 2 diabetes mellitus with unspecified complications: Secondary | ICD-10-CM | POA: Diagnosis not present

## 2023-04-16 DIAGNOSIS — Z79899 Other long term (current) drug therapy: Secondary | ICD-10-CM | POA: Diagnosis not present

## 2023-04-16 DIAGNOSIS — N1832 Chronic kidney disease, stage 3b: Secondary | ICD-10-CM | POA: Diagnosis not present

## 2023-04-17 ENCOUNTER — Other Ambulatory Visit (HOSPITAL_COMMUNITY): Payer: Self-pay

## 2023-04-17 ENCOUNTER — Other Ambulatory Visit: Payer: Self-pay

## 2023-04-17 MED ORDER — METOPROLOL SUCCINATE ER 100 MG PO TB24
100.0000 mg | ORAL_TABLET | Freq: Every day | ORAL | 0 refills | Status: DC
  Filled 2023-04-17 – 2023-06-12 (×2): qty 90, 90d supply, fill #0

## 2023-05-13 ENCOUNTER — Other Ambulatory Visit (HOSPITAL_COMMUNITY): Payer: Self-pay

## 2023-05-15 ENCOUNTER — Other Ambulatory Visit: Payer: Self-pay

## 2023-05-16 ENCOUNTER — Other Ambulatory Visit (HOSPITAL_COMMUNITY): Payer: Self-pay

## 2023-05-16 DIAGNOSIS — E291 Testicular hypofunction: Secondary | ICD-10-CM | POA: Diagnosis not present

## 2023-05-16 MED ORDER — BUPROPION HCL ER (XL) 300 MG PO TB24
300.0000 mg | ORAL_TABLET | Freq: Every morning | ORAL | 0 refills | Status: DC
  Filled 2023-05-16: qty 90, 90d supply, fill #0

## 2023-05-16 MED ORDER — VILAZODONE HCL 40 MG PO TABS
40.0000 mg | ORAL_TABLET | Freq: Every day | ORAL | 0 refills | Status: DC
  Filled 2023-05-16: qty 90, 90d supply, fill #0

## 2023-05-18 DIAGNOSIS — Z79899 Other long term (current) drug therapy: Secondary | ICD-10-CM | POA: Diagnosis not present

## 2023-05-18 DIAGNOSIS — E118 Type 2 diabetes mellitus with unspecified complications: Secondary | ICD-10-CM | POA: Diagnosis not present

## 2023-05-30 ENCOUNTER — Other Ambulatory Visit (HOSPITAL_COMMUNITY): Payer: Self-pay

## 2023-06-12 ENCOUNTER — Other Ambulatory Visit (HOSPITAL_BASED_OUTPATIENT_CLINIC_OR_DEPARTMENT_OTHER): Payer: Self-pay

## 2023-06-12 ENCOUNTER — Other Ambulatory Visit (HOSPITAL_COMMUNITY): Payer: Self-pay

## 2023-06-12 ENCOUNTER — Other Ambulatory Visit: Payer: Self-pay

## 2023-06-13 ENCOUNTER — Other Ambulatory Visit (HOSPITAL_COMMUNITY): Payer: Self-pay

## 2023-06-13 ENCOUNTER — Other Ambulatory Visit: Payer: Self-pay

## 2023-06-13 MED ORDER — ATORVASTATIN CALCIUM 40 MG PO TABS
40.0000 mg | ORAL_TABLET | Freq: Every day | ORAL | 2 refills | Status: DC
  Filled 2023-06-13: qty 90, 90d supply, fill #0
  Filled 2023-10-02: qty 90, 90d supply, fill #1
  Filled 2023-12-27: qty 90, 90d supply, fill #2

## 2023-06-15 ENCOUNTER — Other Ambulatory Visit (HOSPITAL_COMMUNITY): Payer: Self-pay

## 2023-06-16 DIAGNOSIS — Z79899 Other long term (current) drug therapy: Secondary | ICD-10-CM | POA: Diagnosis not present

## 2023-06-28 DIAGNOSIS — E11621 Type 2 diabetes mellitus with foot ulcer: Secondary | ICD-10-CM | POA: Diagnosis not present

## 2023-07-06 ENCOUNTER — Other Ambulatory Visit (HOSPITAL_COMMUNITY): Payer: Self-pay

## 2023-07-12 DIAGNOSIS — E782 Mixed hyperlipidemia: Secondary | ICD-10-CM | POA: Diagnosis not present

## 2023-07-12 DIAGNOSIS — G609 Hereditary and idiopathic neuropathy, unspecified: Secondary | ICD-10-CM | POA: Diagnosis not present

## 2023-07-12 DIAGNOSIS — I10 Essential (primary) hypertension: Secondary | ICD-10-CM | POA: Diagnosis not present

## 2023-07-12 DIAGNOSIS — E1165 Type 2 diabetes mellitus with hyperglycemia: Secondary | ICD-10-CM | POA: Diagnosis not present

## 2023-07-12 DIAGNOSIS — Z125 Encounter for screening for malignant neoplasm of prostate: Secondary | ICD-10-CM | POA: Diagnosis not present

## 2023-07-12 DIAGNOSIS — F419 Anxiety disorder, unspecified: Secondary | ICD-10-CM | POA: Diagnosis not present

## 2023-07-12 DIAGNOSIS — E291 Testicular hypofunction: Secondary | ICD-10-CM | POA: Diagnosis not present

## 2023-07-13 DIAGNOSIS — F1120 Opioid dependence, uncomplicated: Secondary | ICD-10-CM | POA: Diagnosis not present

## 2023-07-14 DIAGNOSIS — Z79899 Other long term (current) drug therapy: Secondary | ICD-10-CM | POA: Diagnosis not present

## 2023-07-20 DIAGNOSIS — E291 Testicular hypofunction: Secondary | ICD-10-CM | POA: Diagnosis not present

## 2023-07-20 DIAGNOSIS — F1120 Opioid dependence, uncomplicated: Secondary | ICD-10-CM | POA: Diagnosis not present

## 2023-07-25 ENCOUNTER — Other Ambulatory Visit (HOSPITAL_COMMUNITY): Payer: Self-pay

## 2023-07-26 ENCOUNTER — Other Ambulatory Visit: Payer: Self-pay

## 2023-07-26 ENCOUNTER — Other Ambulatory Visit (HOSPITAL_COMMUNITY): Payer: Self-pay

## 2023-07-28 ENCOUNTER — Other Ambulatory Visit (HOSPITAL_COMMUNITY): Payer: Self-pay

## 2023-07-31 DIAGNOSIS — Z6831 Body mass index (BMI) 31.0-31.9, adult: Secondary | ICD-10-CM | POA: Diagnosis not present

## 2023-07-31 DIAGNOSIS — M47816 Spondylosis without myelopathy or radiculopathy, lumbar region: Secondary | ICD-10-CM | POA: Diagnosis not present

## 2023-08-11 ENCOUNTER — Other Ambulatory Visit (HOSPITAL_COMMUNITY): Payer: Self-pay

## 2023-08-11 MED ORDER — BUPROPION HCL ER (XL) 300 MG PO TB24
300.0000 mg | ORAL_TABLET | Freq: Every morning | ORAL | 0 refills | Status: DC
  Filled 2023-08-11: qty 90, 90d supply, fill #0

## 2023-08-11 MED ORDER — VILAZODONE HCL 40 MG PO TABS
40.0000 mg | ORAL_TABLET | Freq: Every day | ORAL | 0 refills | Status: DC
  Filled 2023-08-11: qty 90, 90d supply, fill #0

## 2023-08-22 ENCOUNTER — Other Ambulatory Visit (HOSPITAL_COMMUNITY): Payer: Self-pay

## 2023-08-22 DIAGNOSIS — Z6829 Body mass index (BMI) 29.0-29.9, adult: Secondary | ICD-10-CM | POA: Diagnosis not present

## 2023-08-22 DIAGNOSIS — N1832 Chronic kidney disease, stage 3b: Secondary | ICD-10-CM | POA: Diagnosis not present

## 2023-08-22 DIAGNOSIS — F32A Depression, unspecified: Secondary | ICD-10-CM | POA: Diagnosis not present

## 2023-08-22 DIAGNOSIS — M542 Cervicalgia: Secondary | ICD-10-CM | POA: Diagnosis not present

## 2023-08-22 DIAGNOSIS — I10 Essential (primary) hypertension: Secondary | ICD-10-CM | POA: Diagnosis not present

## 2023-08-22 DIAGNOSIS — M5442 Lumbago with sciatica, left side: Secondary | ICD-10-CM | POA: Diagnosis not present

## 2023-08-22 DIAGNOSIS — M549 Dorsalgia, unspecified: Secondary | ICD-10-CM | POA: Diagnosis not present

## 2023-08-22 DIAGNOSIS — F1120 Opioid dependence, uncomplicated: Secondary | ICD-10-CM | POA: Diagnosis not present

## 2023-08-22 DIAGNOSIS — E118 Type 2 diabetes mellitus with unspecified complications: Secondary | ICD-10-CM | POA: Diagnosis not present

## 2023-08-22 DIAGNOSIS — M5441 Lumbago with sciatica, right side: Secondary | ICD-10-CM | POA: Diagnosis not present

## 2023-08-22 DIAGNOSIS — F419 Anxiety disorder, unspecified: Secondary | ICD-10-CM | POA: Diagnosis not present

## 2023-08-22 MED ORDER — BUPRENORPHINE HCL-NALOXONE HCL 8-2 MG SL FILM
1.0000 | ORAL_FILM | Freq: Two times a day (BID) | SUBLINGUAL | 0 refills | Status: DC
Start: 2023-08-22 — End: 2023-09-25
  Filled 2023-08-22 – 2023-08-25 (×2): qty 60, 30d supply, fill #0

## 2023-08-23 ENCOUNTER — Other Ambulatory Visit (HOSPITAL_COMMUNITY): Payer: Self-pay

## 2023-08-23 DIAGNOSIS — E291 Testicular hypofunction: Secondary | ICD-10-CM | POA: Diagnosis not present

## 2023-08-23 NOTE — Therapy (Signed)
OUTPATIENT PHYSICAL THERAPY THORACOLUMBAR EVALUATION   Patient Name: Brandon Gordon MRN: [id number] DOB:May 16, 1971, 52 y.o., male Today's Date: 08/24/2023  END OF SESSION:  PT End of Session - 08/24/23 0918     Visit Number 1    Number of Visits 13    Date for PT Re-Evaluation 10/05/23    Authorization Type [place] [employer]    PT Start Time [phone]    PT Stop Time 1010    PT Time Calculation (min) 45 min    Activity Tolerance Patient tolerated treatment well    Behavior During Therapy [facility] for tasks assessed/performed             Past Medical History:  Diagnosis Date   Anxiety    At risk for sleep apnea    STOP-BANG= 5      SENT TO PCP 04-05-2016   Chronic pain syndrome    History of kidney stones    Hyperlipidemia    Hypertension    Left ureteral stone    OA (osteoarthritis)    back, hip   Pain management    Type 2 diabetes mellitus (HCC)    Past Surgical History:  Procedure Laterality Date   TOTAL HIP ARTHROPLASTY Right 04-22-2011   There are no problems to display for this patient.   PCP: Noberto Retort, MD   REFERRING PROVIDER: Bedelia Person, MD   REFERRING DIAG: [phone] (ICD-10-CM) - Spondylosis without myelopathy or radiculopathy, lumbar region   Rationale for Evaluation and Treatment: Rehabilitation  THERAPY DIAG:  Chronic bilateral low back pain with right-sided sciatica  Abnormal posture  Other abnormalities of gait and mobility  ONSET DATE: 20 years of back pain/ scoliosis,  recently pain in back and continuing to bend back  SUBJECTIVE:                                                                                                                                                                                           SUBJECTIVE STATEMENT: My wife is [name] PT at [employer].  I am now in a job in which I have to bend over a desk.  I am 6'6"   I played basketball [address], [address] and [employer] in college.  Pain in back  is overwhelming. I have a  special needs son. I have been having pain that really effects my life. I have severe scoliosis and a hx of compressed L4 L5 . I feel like the hunchback of [facility].  I also swim at [facility]  and the [facility] in winter.  I have been in a new job with about 8  hours of sitting.   I do not have a standing desk. I work 40% in office and 60% at home.  I have had dry needling and it was helpful and water. 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  I use 20lb  DB to do bench press etc.   Pt with long history of back pain and lifelong dx of scoliosis  PERTINENT HISTORY:  Chronic pain syndrome, hyperlipidemia, HTN, OA DM Type 2, Pain managament. THR R 12 years ago May 2012.  Left shoulder hypermobility and chronic dislocation since basketball days. Surgery of 5th toe infection March 2003. Anxiety  PAIN:  Are you having pain? Yes: NPRS scale: at rest 3/10 and  7/10 Pain location: low back radiating into R buttock and thoracic pain at scoliotic curve Pain description: sharp constant and spasming Aggravating factors: sitting for longer than 30 min, walking for longer  than 5-8 min and begin spasm Relieving factors: Rest Household chores and carrying laundry, any walking  and unable to do any gold swing PRECAUTIONS: None  RED FLAGS: Bowel or bladder incontinence: No and Spinal tumors: No   WEIGHT BEARING RESTRICTIONS: No  FALLS:  Has patient fallen in last 6 months? 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  PLOF: Independent  PATIENT GOALS: To be able to walk again and try to play golf , try to strengthen safely  NEXT MD VISIT: TBD  OBJECTIVE:   DIAGNOSTIC FINDINGS:  Recently taken at [facility]  medical  PATIENT SURVEYS:  FOTO 47% Predicted 56%  SCREENING FOR RED FLAGS: Bowel or bladder incontinence: No Spinal tumors: No   COGNITION: Overall cognitive status: Within functional limits for tasks assessed     SENSATION: No numbness but sometimes tingling on top of feet and I sometimes have fasciculations  MUSCLE LENGTH: :Thomas test Right  WNL able to flatten deg; Left -32 from horizontal deg  Hamstrings: Right 74 deg; Left 69 deg  1/2 range of bridge only  POSTURE: rounded shoulders, forward head, and scoliosis with R elevated pelvis and scoliotic curve leaning to the right in sitting and standing with flexed and  rotated to Left posture  PALPATION: Pt with tightened Left QL, tightened bil hamstrings and  especially Left hip flexor.    LUMBAR ROM: P! - Pain severity  AROM eval  Flexion Finger tips to mid shins   Extension -15 extension P! Unable to come to neutral ext  Right lateral flexion Finger tip 1.5 inch above knee jt line P!  Left lateral flexion Finger tip to fibular head   Right rotation 50%  Left rotation 50% Stands in left rotation   (Blank rows = not tested)  LOWER EXTREMITY ROM:     Active  Right eval Left eval  Hip flexion Supine 120 Supine 100 unable to come to neutral  Hip extension    Hip abduction    Hip adduction    Hip internal rotation 22P!!! 12 P!!!  Hip external rotation 60 55  Knee flexion    Knee extension    Ankle dorsiflexion    Ankle plantarflexion    Ankle inversion    Ankle eversion     (Blank rows =  not tested)  LOWER EXTREMITY MMT:    MMT Right eval Left eval  Hip flexion 4+ 4+  Hip extension 3-/ restricted  motion unable to come to neutral   Hip abduction 3+ 4-  Hip adduction    Hip internal rotation    Hip external rotation    Knee flexion 5 4+  Knee extension 5 5  Ankle dorsiflexion    Ankle plantarflexion    Ankle inversion    Ankle eversion     (Blank rows = not tested)   FUNCTIONAL TESTS:  5 times  sit to stand: 17.95 sec  GAIT: Distance walked: 150 feet Assistive device utilized: None Level of assistance: Complete Independence Comments: Ambulates with flexed posture  with left and rotation to left posture.  Pt with left pelvic level higher than Right and concave scoliosis curve on Right  TODAY'S TREATMENT:                                                                                                                              DATE: 08-24-23  Eval    PATIENT EDUCATION:  Education details: POC, Explanation of findings,  FOTO report Person educated: Patient Education method: Explanation, Demonstration, Tactile cues, and Verbal cues Education comprehension: verbalized understanding, returned demonstration, verbal cues required, and tactile cues required  HOME EXERCISE PROGRAM: TBD  ASSESSMENT:  CLINICAL IMPRESSION: Patient is a 52 y.o. male who was seen today for physical therapy evaluation and treatment for spondylosis  and moderate scoliosis with flexed posture and scoliosis rotated to left with convex on Right. Including R sided sciatic pain.  Pt has has long history of back pain and lifetime dx of scoliosis.  He has thoracic.lumbar spasming into R buttock.  Pt with flexibility issues contributing to pain.He has a job In which he sits and works on [employer] also contributing to his over flexed posture. Pt would benefit form standing desk and HEP/helps to decrease seated time. Pt will benefit from skilled PT to reduce pain and increase strength and flexibility to return to as neutral as possible. Pt needs to care for special needs son. He is limited due to pain in daily sedentary job and with sleep at night. He desires to return to a more active lifestyle playing racquet sports and golf as able but his sedentary job adds to his flexed and scoliotic posture. Will continue to maximize AROM and strength as able to add more varied leisure activities     OBJECTIVE IMPAIRMENTS: decreased  activity tolerance, decreased knowledge of condition, difficulty walking, decreased ROM, decreased strength, increased fascial restrictions, improper body mechanics, postural dysfunction, and obesity.   ACTIVITY LIMITATIONS: carrying, bending, sitting, standing, squatting, sleeping, stairs, locomotion level, and stairs  PARTICIPATION LIMITATIONS: driving, community activity, occupation, and caring for special needs son  PERSONAL FACTORS: Chronic pain syndrome, hyperlipidemia, HTN, OA DM Type 2, Pain managament. THR R 12 years ago May 2012.  Left shoulder hypermobility and chronic dislocation  since basketball days. Surgery of 5th toe infection March 2003. Anxiety are also affecting patient's functional outcome.   REHAB POTENTIAL: Good  CLINICAL DECISION MAKING: Evolving/moderate complexity  EVALUATION COMPLEXITY: Moderate   GOALS: Goals reviewed with patient? Yes  SHORT TERM GOALS: Target date: 09-14-23  Pt will be independent with initial HEP Baseline:no knowledge Goal status: INITIAL  2.  Pt will work on sleep positions to increase more comfort in order to have at least 5 or more hours of restorative sleep Baseline:  tosses and turns at night and has increased pain Goal status: INITIAL  3.  Demonstrate understanding of  as neutral posture as possible with scoliosis and be more conscious of position and posture throughout the day.  Baseline: no knowledge Goal status: INITIAL  4.  Pain with going up and down steps with maximum safety Baseline: Pt descends steps backwards due to size 16 shoe Goal status: INITIAL   LONG TERM GOALS: Target date: 10-05-23  Pt will be independent with advanced HEP using gym equipment Baseline:  Goal status: INITIAL  2.  Pt will be able to walk for  25 minutes using pain modulation Baseline: Can only walk for 5-8 minutes Goal status: INITIAL  3.  Pt will be able to pick up 25 # KB with proper form for exercise Baseline:  Goal status:  INITIAL  4.  Pt will be able to improve flexibility in order to try to begin racquet sports /golf as able.  Baseline: unable to participate now Goal status: INITIAL  5.  Pain with activity will decrease  to 3/10 instead of 7/10 Baseline: At worse with functional activity 7/10 Goal status: INITIAL  6.  Pt will simulate golfing/ racquet sports and add strengthening/flexibility exercises that will enhance ability Baseline: Unable to  play sports at present due to spasming Goal status: INITIAL  PLAN:  PT FREQUENCY: 1-2x/week  PT DURATION: 6 weeks  PLANNED INTERVENTIONS: Therapeutic exercises, Therapeutic activity, Neuromuscular re-education, Balance training, Gait training, Patient/Family education, Self Care, Joint mobilization, Stair training, Dry Needling, Electrical stimulation, Spinal mobilization, Cryotherapy, Moist heat, Taping, Ionotophoresis 4mg /ml Dexamethasone, Manual therapy, and Re-evaluation.  PLAN FOR NEXT SESSION: TPDN and issue initial HEP  Garen Lah, PT, ATRIC Certified Exercise Expert for the Aging Adult  08/24/23 2:00 PM Phone: [phone] Fax: [phone]

## 2023-08-24 ENCOUNTER — Other Ambulatory Visit: Payer: Self-pay

## 2023-08-24 ENCOUNTER — Ambulatory Visit: Payer: 59 | Attending: Neurosurgery | Admitting: Physical Therapy

## 2023-08-24 ENCOUNTER — Encounter: Payer: Self-pay | Admitting: Physical Therapy

## 2023-08-24 ENCOUNTER — Other Ambulatory Visit (HOSPITAL_COMMUNITY): Payer: Self-pay

## 2023-08-24 DIAGNOSIS — R2689 Other abnormalities of gait and mobility: Secondary | ICD-10-CM | POA: Insufficient documentation

## 2023-08-24 DIAGNOSIS — M5441 Lumbago with sciatica, right side: Secondary | ICD-10-CM | POA: Diagnosis not present

## 2023-08-24 DIAGNOSIS — G8929 Other chronic pain: Secondary | ICD-10-CM | POA: Diagnosis not present

## 2023-08-24 DIAGNOSIS — R293 Abnormal posture: Secondary | ICD-10-CM | POA: Diagnosis not present

## 2023-08-25 ENCOUNTER — Other Ambulatory Visit (HOSPITAL_COMMUNITY): Payer: Self-pay

## 2023-08-28 ENCOUNTER — Ambulatory Visit: Payer: 59 | Admitting: Physical Therapy

## 2023-08-28 DIAGNOSIS — R2689 Other abnormalities of gait and mobility: Secondary | ICD-10-CM | POA: Diagnosis not present

## 2023-08-28 DIAGNOSIS — R293 Abnormal posture: Secondary | ICD-10-CM | POA: Diagnosis not present

## 2023-08-28 DIAGNOSIS — G8929 Other chronic pain: Secondary | ICD-10-CM | POA: Diagnosis not present

## 2023-08-28 DIAGNOSIS — M5441 Lumbago with sciatica, right side: Secondary | ICD-10-CM | POA: Diagnosis not present

## 2023-08-28 NOTE — Therapy (Signed)
OUTPATIENT PHYSICAL THERAPY THORACOLUMBAR EVALUATION   Patient Name: Brandon Gordon MRN: [id number] DOB:10-13-1971, 52 y.o., male Today's Date: 08/28/2023  END OF SESSION:  PT End of Session - 08/28/23 1429     Visit Number 2    Number of Visits 13    Date for PT Re-Evaluation 10/05/23    Authorization Type [place] [employer]    PT Start Time 1416    PT Stop Time 1500    PT Time Calculation (min) 44 min    Activity Tolerance Patient tolerated treatment well    Behavior During Therapy WFL for tasks assessed/performed             Past Medical History:  Diagnosis Date   Anxiety    At risk for sleep apnea    STOP-BANG= 5      SENT TO PCP 04-05-2016   Chronic pain syndrome    History of kidney stones    Hyperlipidemia    Hypertension    Left ureteral stone    OA (osteoarthritis)    back, hip   Pain management    Type 2 diabetes mellitus (HCC)    Past Surgical History:  Procedure Laterality Date   TOTAL HIP ARTHROPLASTY Right 04-22-2011   There are no problems to display for this patient.   PCP: Noberto Retort, MD   REFERRING PROVIDER: Bedelia Person, MD   REFERRING DIAG: [phone] (ICD-10-CM) - Spondylosis without myelopathy or radiculopathy, lumbar region   Rationale for Evaluation and Treatment: Rehabilitation  THERAPY DIAG:  Chronic bilateral low back pain with right-sided sciatica  Abnormal posture  Other abnormalities of gait and mobility  ONSET DATE: 20 years of back pain/ scoliosis,  recently pain in back and continuing to bend back  SUBJECTIVE:                                                                                                                                                                                           SUBJECTIVE STATEMENT:  I lie on my Right side most of the time  90% of time.  I have been bumping into things.  I have size 16 shoes.  I don't go out very much because of pain to go to things.  Like walking up the hill  at [facility].  I find it really exertion is difficult.   EVAL-  My wife is [name] PT at [employer]  I am now in a job in which I have to bend over a desk.  I am 6'6"   I played basketball [address], [address] and [employer] in college.  Pain in back  is overwhelming. I have a special needs son. I have been having pain that really effects my life. I have severe scoliosis and a hx of compressed L4 L5 . I feel like the hunchback of [facility].  I also swim at [facility]  and [facility] in winter.  I have been in a new job with about 8 hours of sitting.   I do not have a standing desk. I work 40% in office and 60% at home.  I have had dry needling and it was helpful and water. 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  I use 20lb  DB to do bench press etc.   Pt with long history of back pain and lifelong dx of scoliosis  PERTINENT HISTORY:  Chronic pain syndrome, hyperlipidemia, HTN, OA DM Type 2, Pain managament. THR R 12 years ago May 2012.  Left shoulder hypermobility and chronic dislocation since basketball days. Surgery of 5th toe infection March 2003. Anxiety  PAIN:  Are you having pain? Yes: NPRS scale: at rest 3/10 and  7/10 Pain location: low back radiating into R buttock and thoracic pain at scoliotic curve Pain description: sharp constant and spasming Aggravating factors: sitting for longer than 30 min, walking for longer  than 5-8 min and begin spasm Relieving factors: Rest Household chores and carrying laundry, any walking  and unable to do any gold swing PRECAUTIONS: None  RED FLAGS: Bowel or bladder incontinence: No and Spinal tumors: No   WEIGHT BEARING RESTRICTIONS: No  FALLS:  Has patient fallen in last 6 months? 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 medical  PATIENT SURVEYS:  FOTO 47% Predicted 56%  SCREENING FOR RED FLAGS: Bowel or bladder incontinence: No Spinal tumors: No   COGNITION: Overall cognitive status: Within functional limits for tasks assessed     SENSATION: No numbness but sometimes tingling on top of feet and I sometimes have fasciculations  MUSCLE LENGTH: :Thomas test Right  WNL able to flatten deg; Left -32 from horizontal deg  Hamstrings: Right 74 deg; Left 69 deg  1/2 range of bridge only  POSTURE: rounded shoulders, forward head, and scoliosis with R elevated pelvis and scoliotic curve leaning to the right in sitting and standing with flexed and  rotated to Left posture  PALPATION: Pt with tightened Left QL, tightened bil hamstrings and  especially Left hip flexor.    LUMBAR ROM: P! - Pain severity  AROM eval  Flexion Finger tips to mid shins   Extension -15 extension P! Unable to come to neutral ext  Right lateral flexion Finger tip 1.5 inch above knee jt line P!  Left lateral flexion Finger tip to fibular head   Right rotation 50%  Left rotation 50% Stands in left rotation   (Blank rows = not tested)  LOWER EXTREMITY ROM:     Active  Right eval Left eval  Hip flexion Supine 120 Supine 100 unable to come to neutral  Hip extension    Hip abduction    Hip adduction    Hip internal rotation 22P!!! 12 P!!!  Hip external rotation 60 55  Knee flexion    Knee extension    Ankle dorsiflexion    Ankle plantarflexion    Ankle inversion    Ankle eversion     (Blank rows = not tested)  LOWER EXTREMITY MMT:    MMT Right eval Left eval  Hip flexion 4+ 4+  Hip extension 3-/ restricted  motion unable to come to neutral   Hip abduction 3+ 4-  Hip  adduction    Hip internal rotation    Hip external rotation    Knee flexion 5 4+  Knee extension 5 5  Ankle dorsiflexion    Ankle plantarflexion    Ankle inversion    Ankle eversion     (Blank rows = not tested)   FUNCTIONAL TESTS:  5 times sit to stand: 17.95 sec  GAIT: Distance walked: 150 feet Assistive device utilized: None Level of assistance: Complete Independence Comments: Ambulates with flexed posture  with left and rotation to left posture.  Pt with left pelvic level higher than Right and concave scoliosis curve on Right  TODAY'S TREATMENT:    [facility] Adult PT Treatment:                                                DATE: 08-28-23 Therapeutic Exercise: Left hip flexor stretch 3 x 30 sec hold Supine LTR Standing on box and elongating Left side while holding onto door frame Manual Therapy: STW to all TPDN muscles with decreased muscle tension Trigger Point Dry-Needling performed     by Garen Lah Treatment instructions: Expect mild to moderate muscle soreness. S/S of pneumothorax if dry needled over a lung field, and to seek immediate medical attention should they occur. Patient verbalized understanding of these instructions and education.  Patient Consent Given: Yes Education handout provided: Previously provided Muscles treated  left QL  thoracic left T -4 to T-8 , Left UT, Left infraspinatus Electrical stimulation performed: No Parameters: N/A Treatment response/outcome: twitch response noted, pt noted relief  Self Care: Educaton on curve and scoliosis and rotational curve                                                                                                                              DATE: 08-24-23  Eval    PATIENT EDUCATION:  Education details: POC, Explanation of findings,  FOTO report Person educated: Patient Education method: Explanation, Demonstration, Tactile cues, and Verbal cues Education comprehension: verbalized understanding, returned  demonstration, verbal cues required, and tactile cues required  HOME EXERCISE PROGRAM:  Access Code: [email or web address] URL: [email or web address] Date: 08/28/2023 Prepared by: Garen Lah  Exercises - Hip Flexor Stretch at [facility] of Bed  -  1 x daily - 7 x weekly - 1 sets - 3 reps -  30-60 sec hold - Supine Lower Trunk Rotation  - 1 x daily - 7 x weekly - 1 sets - 5 reps - 20 sec hold  ASSESSMENT:  CLINICAL IMPRESSION: Brandon Gordon presents to clinic with severe moderate scoliosis but severe left lean with gait and inability to  come to neutral.  Pt presents with pain 5/10 today and consented to TPDN and was closely monitored throughout session.  Pt given initial HEP and educated on scoliotic rotational curve. Will continue to develop HEP     EVAL- Patient is a 52 y.o. male who was seen today for physical therapy evaluation and treatment for spondylosis  and moderate scoliosis with flexed posture and scoliosis rotated to left with convex on Right. Including R sided sciatic pain.  Pt has has long history of back pain and lifetime dx of scoliosis.  He has thoracic.lumbar spasming into R buttock.  Pt with flexibility issues contributing to pain.He has a job In which he sits and works on [employer] also contributing to his over flexed posture. Pt would benefit form standing desk and HEP/helps to decrease seated time. Pt will benefit from skilled PT to reduce pain and increase strength and flexibility to return to as neutral as possible. Pt needs to care for special needs son. He is limited due to pain in daily sedentary job and with sleep at night. He desires to return to a more active lifestyle playing racquet sports and golf as able but his sedentary job adds to his flexed and scoliotic posture. Will continue to maximize AROM and strength as able to add more varied leisure activities     OBJECTIVE IMPAIRMENTS: decreased activity tolerance, decreased knowledge of condition, difficulty walking,  decreased ROM, decreased strength, increased fascial restrictions, improper body mechanics, postural dysfunction, and obesity.   ACTIVITY LIMITATIONS: carrying, bending, sitting, standing, squatting, sleeping, stairs, locomotion level, and stairs  PARTICIPATION LIMITATIONS: driving, community activity, occupation, and caring for special needs son  PERSONAL FACTORS: Chronic pain syndrome, hyperlipidemia, HTN, OA DM Type 2, Pain managament. THR R 12 years ago May 2012.  Left shoulder hypermobility and chronic dislocation since basketball days. Surgery of 5th toe infection March 2003. Anxiety are also affecting patient's functional outcome.   REHAB POTENTIAL: Good  CLINICAL DECISION MAKING: Evolving/moderate complexity  EVALUATION COMPLEXITY: Moderate   GOALS: Goals reviewed with patient? Yes  SHORT TERM GOALS: Target date: 09-14-23  Pt will be independent with initial HEP Baseline:no knowledge Goal status: INITIAL  2.  Pt will work on sleep positions to increase more comfort in order to have at least 5 or more hours of restorative sleep Baseline:  tosses and turns at night and has increased pain Goal status: INITIAL  3.  Demonstrate understanding of  as neutral posture as possible with scoliosis and be more conscious of position and posture throughout the day.  Baseline: no knowledge Goal status: INITIAL  4.  Pain with going up and down steps with maximum safety Baseline: Pt descends steps backwards due to size 16 shoe Goal status: INITIAL   LONG TERM GOALS: Target date: 10-05-23  Pt will be independent with advanced HEP using gym equipment Baseline:  Goal status: INITIAL  2.  Pt will be able to walk for  25 minutes using pain modulation Baseline: Can only walk for 5-8 minutes Goal status: INITIAL  3.  Pt will be able to pick up 25 # KB with proper  form for exercise Baseline:  Goal status: INITIAL  4.  Pt will be able to improve flexibility in order to try to begin  racquet sports /golf as able.  Baseline: unable to participate now Goal status: INITIAL  5.  Pain with activity will decrease  to 3/10 instead of 7/10 Baseline: At worse with functional activity 7/10 Goal status: INITIAL  6.  Pt will simulate golfing/ racquet sports and add strengthening/flexibility exercises that will enhance ability Baseline: Unable to  play sports at present due to spasming Goal status: INITIAL  PLAN:  PT FREQUENCY: 1-2x/week  PT DURATION: 6 weeks  PLANNED INTERVENTIONS: Therapeutic exercises, Therapeutic activity, Neuromuscular re-education, Balance training, Gait training, Patient/Family education, Self Care, Joint mobilization, Stair training, Dry Needling, Electrical stimulation, Spinal mobilization, Cryotherapy, Moist heat, Taping, Ionotophoresis 4mg /ml Dexamethasone, Manual therapy, and Re-evaluation.  PLAN FOR NEXT SESSION: TPDN and issue initial HEP  Garen Lah, PT, ATRIC Certified Exercise Expert for the Aging Adult  08/28/23 4:17 PM Phone: [phone] Fax: [phone]

## 2023-08-28 NOTE — Patient Instructions (Signed)

## 2023-08-29 DIAGNOSIS — Z79899 Other long term (current) drug therapy: Secondary | ICD-10-CM | POA: Diagnosis not present

## 2023-08-30 ENCOUNTER — Encounter: Payer: 59 | Admitting: Physical Therapy

## 2023-08-30 NOTE — Therapy (Signed)
OUTPATIENT PHYSICAL THERAPY TREATMENT NOTE   Patient Name: Brandon Gordon MRN: [id number] DOB:[birth year], 52 y.o., male Today's Date: 08/31/2023  END OF SESSION:  PT End of Session - 08/31/23 1146     Visit Number 3    Number of Visits 13    Date for PT Re-Evaluation 10/05/23    Authorization Type [employer]    PT Start Time 1146    PT Stop Time 1235    PT Time Calculation (min) 49 min    Activity Tolerance Patient tolerated treatment well    Behavior During Therapy WFL for tasks assessed/performed              Past Medical History:  Diagnosis Date   Anxiety    At risk for sleep apnea    STOP-BANG= 5      SENT TO PCP 04-05-2016   Chronic pain syndrome    History of kidney stones    Hyperlipidemia    Hypertension    Left ureteral stone    OA (osteoarthritis)    back, hip   Pain management    Type 2 diabetes mellitus (HCC)    Past Surgical History:  Procedure Laterality Date   TOTAL HIP ARTHROPLASTY Right 04-22-2011   There are no problems to display for this patient.   PCP: Brandon Retort, MD   REFERRING PROVIDER: Bedelia Person, MD   REFERRING DIAG: [phone] (ICD-10-CM) - Spondylosis without myelopathy or radiculopathy, lumbar region   Rationale for Evaluation and Treatment: Rehabilitation  THERAPY DIAG:  Chronic bilateral low back pain with right-sided sciatica  Abnormal posture  Other abnormalities of gait and mobility  ONSET DATE: 20 years of back pain/ scoliosis,  recently pain in back and continuing to bend back  SUBJECTIVE:                                                                                                                                                                                           SUBJECTIVE STATEMENT: 08/31/2023 Pt arrives w/ 2-3/10 pain, notes good relief for remainder of day after needling last session. Is having a bit more pain today which he attributes to spending a lot of time in the car yesterday  and doing a strenuous workout last night. No other new updates.    EVAL-  My wife is Brandon Gordon PT at [employer]  I am now in a job in which I have to bend over a desk.  I am 6'6"   I played basketball [address], [address] and [employer] in college.  Pain in back  is overwhelming. I have a special needs son.  I have been having pain that really effects my life. I have severe scoliosis and a hx of compressed L4 L5 . I feel like the hunchback of [facility].  I also swim at [facility]  and the [facility] in winter.  I have been in a new job with about 8 hours of sitting.   I do not have a standing desk. I work 40% in office and 60% at home.  I have had dry needling and it was helpful and water. 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  I use 20lb  DB to do bench press etc.   Pt with long history of back pain and lifelong dx of scoliosis  PERTINENT HISTORY:  Chronic pain syndrome, hyperlipidemia, HTN, OA DM Type 2, Pain managament. THR R 12 years ago May 2012.  Left shoulder hypermobility and chronic dislocation since basketball days. Surgery of 5th toe infection March 2003. Anxiety  PAIN:  Are you having pain? Yes: NPRS scale: at rest 3/10 and  7/10 Pain location: low back radiating into R buttock and thoracic pain at scoliotic curve Pain description: sharp constant and spasming Aggravating factors: sitting for longer than 30 min, walking for longer  than 5-8 min and begin spasm Relieving factors: Rest Household chores and carrying laundry, any walking  and unable to do any gold swing PRECAUTIONS: None  RED FLAGS: Bowel or bladder incontinence: No and Spinal tumors: No   WEIGHT BEARING RESTRICTIONS: No  FALLS:  Has patient fallen in last 6 months? 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  PLOF: Independent  PATIENT GOALS: To be able to walk again and try to play golf , try to strengthen safely  NEXT MD VISIT: TBD  OBJECTIVE: (objective measures completed at initial evaluation unless otherwise dated)   DIAGNOSTIC FINDINGS:  Recently taken at [facility]  PATIENT SURVEYS:  FOTO 47% Predicted 56%  SCREENING FOR RED FLAGS: Bowel or bladder incontinence: No Spinal tumors: No   COGNITION: Overall cognitive status: Within functional limits for tasks assessed     SENSATION: No numbness but sometimes tingling on top of feet and I sometimes have fasciculations  MUSCLE LENGTH: :Thomas test Right  WNL able to flatten deg; Left -32 from horizontal deg  Hamstrings: Right 74 deg; Left 69 deg  1/2 range of bridge only  POSTURE: rounded shoulders, forward head, and scoliosis with R elevated pelvis and scoliotic curve leaning to the right in sitting and standing with flexed and  rotated to Left posture  PALPATION: Pt with tightened Left QL, tightened bil hamstrings and  especially Left hip flexor.    LUMBAR ROM: P! - Pain severity  AROM eval  Flexion Finger tips to mid shins   Extension -15 extension P! Unable to come to neutral ext  Right lateral flexion Finger tip 1.5 inch above knee jt line P!  Left lateral flexion Finger tip to fibular head   Right rotation 50%  Left rotation 50% Stands in left rotation   (Blank rows = not tested)  LOWER EXTREMITY ROM:     Active  Right eval Left eval  Hip flexion Supine 120 Supine 100 unable to come to neutral  Hip extension    Hip abduction    Hip adduction    Hip internal rotation 22P!!! 12 P!!!  Hip external rotation 60 55  Knee flexion    Knee extension    Ankle dorsiflexion    Ankle plantarflexion    Ankle inversion    Ankle eversion     (Blank rows = not tested)  LOWER EXTREMITY MMT:    MMT Right eval Left eval  Hip flexion 4+ 4+  Hip  extension 3-/ restricted  motion unable to come to neutral   Hip abduction 3+ 4-  Hip adduction    Hip internal rotation    Hip external rotation    Knee flexion 5 4+  Knee extension 5 5  Ankle dorsiflexion    Ankle plantarflexion    Ankle inversion    Ankle eversion     (Blank rows = not tested)   FUNCTIONAL TESTS:  5 times sit to stand: 17.95 sec  GAIT: Distance walked: 150 feet Assistive device utilized: None Level of assistance: Complete Independence Comments: Ambulates with flexed posture  with left and rotation to left posture.  Pt with left pelvic level higher than Right and concave scoliosis curve on Right  TODAY'S TREATMENT:    [facility] Adult PT Treatment:                                                DATE: 08/31/23 Therapeutic Exercise: LTR x10  Open book x10 R side only (L sidelying) L QL stretch w/ physioball fulcrum, 2x8 CGA given mild instability during lean, increased time for setup/performance, cues for appropriate ball height at hip Standing L hip flexor stretch + contralateral trunk rotation 2x10; use of treadmill for LUE support, cueing for pelvic positioning Significant time spent w/ education/discussion re: pt exercise outside of session, relevant anatomy/physiology as it pertains to exercise, and rationale for interventions    [facility] Adult PT Treatment:                                                DATE: 08-28-23 Therapeutic Exercise: Left hip flexor stretch 3 x 30 sec hold Supine LTR Standing on box and elongating Left side while holding onto door frame Manual Therapy: STW to all TPDN muscles with decreased muscle tension Trigger Point Dry-Needling performed     by Garen Lah Treatment instructions: Expect mild to moderate muscle soreness. S/S of pneumothorax if dry needled over a lung field, and to seek immediate medical attention should they occur. Patient verbalized understanding of these instructions and education.  Patient Consent Given:  Yes Education handout provided: Previously provided Muscles treated  left QL  thoracic left T -4 to T-8 , Left UT, Left infraspinatus Electrical stimulation performed: No Parameters: N/A Treatment response/outcome: twitch response noted, pt noted relief  Self Care: Educaton on curve and scoliosis and rotational curve  DATE: 08-24-23  Eval    PATIENT EDUCATION:  Education details: rationale for interventions Gordon educated: Patient Education method: Explanation, Demonstration, Tactile cues, and Verbal cues Education comprehension: verbalized understanding, returned demonstration, verbal cues required, and tactile cues required  HOME EXERCISE PROGRAM:  Access Code: [email or web address] URL: [email or web address] Date: 08/28/2023 Prepared by: Garen Lah  Exercises - Hip Flexor Stretch at [facility] of Bed  - 1 x daily - 7 x weekly - 1 sets - 3 reps -  30-60 sec hold - Supine Lower Trunk Rotation  - 1 x daily - 7 x weekly - 1 sets - 5 reps - 20 sec hold  ASSESSMENT:  CLINICAL IMPRESSION: 08/31/2023 Pt arrived w/ 2-3/10, some symptom aggravation after activities yesterday, but he did note that needling seemed helpful last session. Today we spent a great deal of time with education/discussion re: relevant anatomy/physiology, rationale for interventions, and pt exercise participation/goals. We also continued to work on multiplanar rotation-based exercises with emphasis on posture and reducing compensations, particularly at pelvis. Pt tolerates session well overall, no adverse events. Would likely benefit from continued mobility work with integration of muscular stability training, recommend continued emphasis on rotation given nature of pt goals (wants to return to rotational activities such as golf, racquet sports). Recommend continuing along current POC in order to  address relevant deficits and improve functional tolerance. Pt departs today's session in no acute distress, all voiced questions/concerns addressed appropriately from PT perspective.       EVAL- Patient is a 52 y.o. male who was seen today for physical therapy evaluation and treatment for spondylosis  and moderate scoliosis with flexed posture and scoliosis rotated to left with convex on Right. Including R sided sciatic pain.  Pt has has long history of back pain and lifetime dx of scoliosis.  He has thoracic.lumbar spasming into R buttock.  Pt with flexibility issues contributing to pain.He has a job In which he sits and works on [employer] also contributing to his over flexed posture. Pt would benefit form standing desk and HEP/helps to decrease seated time. Pt will benefit from skilled PT to reduce pain and increase strength and flexibility to return to as neutral as possible. Pt needs to care for special needs son. He is limited due to pain in daily sedentary job and with sleep at night. He desires to return to a more active lifestyle playing racquet sports and golf as able but his sedentary job adds to his flexed and scoliotic posture. Will continue to maximize AROM and strength as able to add more varied leisure activities     OBJECTIVE IMPAIRMENTS: decreased activity tolerance, decreased knowledge of condition, difficulty walking, decreased ROM, decreased strength, increased fascial restrictions, improper body mechanics, postural dysfunction, and obesity.   ACTIVITY LIMITATIONS: carrying, bending, sitting, standing, squatting, sleeping, stairs, locomotion level, and stairs  PARTICIPATION LIMITATIONS: driving, community activity, occupation, and caring for special needs son  PERSONAL FACTORS: Chronic pain syndrome, hyperlipidemia, HTN, OA DM Type 2, Pain managament. THR R 12 years ago May 2012.  Left shoulder hypermobility and chronic dislocation since basketball days. Surgery of 5th toe infection  March 2003. Anxiety are also affecting patient's functional outcome.   REHAB POTENTIAL: Good  CLINICAL DECISION MAKING: Evolving/moderate complexity  EVALUATION COMPLEXITY: Moderate   GOALS: Goals reviewed with patient? Yes  SHORT TERM GOALS: Target date: 09-14-23  Pt will be independent with initial HEP Baseline:no knowledge Goal status: INITIAL  2.  Pt will work on sleep positions to increase more comfort  in order to have at least 5 or more hours of restorative sleep Baseline:  tosses and turns at night and has increased pain Goal status: INITIAL  3.  Demonstrate understanding of  as neutral posture as possible with scoliosis and be more conscious of position and posture throughout the day.  Baseline: no knowledge Goal status: INITIAL  4.  Pain with going up and down steps with maximum safety Baseline: Pt descends steps backwards due to size 16 shoe Goal status: INITIAL   LONG TERM GOALS: Target date: 10-05-23  Pt will be independent with advanced HEP using gym equipment Baseline:  Goal status: INITIAL  2.  Pt will be able to walk for  25 minutes using pain modulation Baseline: Can only walk for 5-8 minutes Goal status: INITIAL  3.  Pt will be able to pick up 25 # KB with proper form for exercise Baseline:  Goal status: INITIAL  4.  Pt will be able to improve flexibility in order to try to begin racquet sports /golf as able.  Baseline: unable to participate now Goal status: INITIAL  5.  Pain with activity will decrease  to 3/10 instead of 7/10 Baseline: At worse with functional activity 7/10 Goal status: INITIAL  6.  Pt will simulate golfing/ racquet sports and add strengthening/flexibility exercises that will enhance ability Baseline: Unable to  play sports at present due to spasming Goal status: INITIAL  PLAN:  PT FREQUENCY: 1-2x/week  PT DURATION: 6 weeks  PLANNED INTERVENTIONS: Therapeutic exercises, Therapeutic activity, Neuromuscular re-education,  Balance training, Gait training, Patient/Family education, Self Care, Joint mobilization, Stair training, Dry Needling, Electrical stimulation, Spinal mobilization, Cryotherapy, Moist heat, Taping, Ionotophoresis 4mg /ml Dexamethasone, Manual therapy, and Re-evaluation.  PLAN FOR NEXT SESSION: review/update HEP PRN. Continue with multiplanar rotational activities, hip flexor mobility, working into postural extension.   Ashley Murrain PT, DPT 08/31/2023 1:44 PM

## 2023-08-31 ENCOUNTER — Ambulatory Visit: Payer: 59 | Admitting: Physical Therapy

## 2023-08-31 ENCOUNTER — Encounter: Payer: Self-pay | Admitting: Physical Therapy

## 2023-08-31 DIAGNOSIS — G8929 Other chronic pain: Secondary | ICD-10-CM | POA: Diagnosis not present

## 2023-08-31 DIAGNOSIS — M5441 Lumbago with sciatica, right side: Secondary | ICD-10-CM | POA: Diagnosis not present

## 2023-08-31 DIAGNOSIS — R2689 Other abnormalities of gait and mobility: Secondary | ICD-10-CM | POA: Diagnosis not present

## 2023-08-31 DIAGNOSIS — R293 Abnormal posture: Secondary | ICD-10-CM

## 2023-09-01 ENCOUNTER — Other Ambulatory Visit (HOSPITAL_COMMUNITY): Payer: Self-pay

## 2023-09-01 MED ORDER — GABAPENTIN 600 MG PO TABS
600.0000 mg | ORAL_TABLET | Freq: Four times a day (QID) | ORAL | 1 refills | Status: DC
  Filled 2023-09-01: qty 360, 90d supply, fill #0
  Filled 2023-12-04: qty 360, 90d supply, fill #1
  Filled 2023-12-04: qty 120, 30d supply, fill #1
  Filled 2024-01-02: qty 120, 30d supply, fill #2
  Filled 2024-02-03: qty 120, 30d supply, fill #3

## 2023-09-04 NOTE — Therapy (Signed)
OUTPATIENT PHYSICAL THERAPY TREATMENT NOTE   Patient Name: Brandon Gordon MRN: [id number] DOB:Dec 22, 1970, 52 y.o., male Today's Date: 09/05/2023  END OF SESSION:  PT End of Session - 09/05/23 1353     Visit Number 4    Number of Visits 13    Date for PT Re-Evaluation 10/05/23    Authorization Type  [employer]    PT Start Time 1357    PT Stop Time 1445    PT Time Calculation (min) 48 min    Activity Tolerance Patient tolerated treatment well    Behavior During Therapy [facility] for tasks assessed/performed               Past Medical History:  Diagnosis Date   Anxiety    At risk for sleep apnea    STOP-BANG= 5      SENT TO PCP 04-05-2016   Chronic pain syndrome    History of kidney stones    Hyperlipidemia    Hypertension    Left ureteral stone    OA (osteoarthritis)    back, hip   Pain management    Type 2 diabetes mellitus (HCC)    Past Surgical History:  Procedure Laterality Date   TOTAL HIP ARTHROPLASTY Right 04-22-2011   There are no problems to display for this patient.   PCP: Brandon Retort, MD   REFERRING PROVIDER: Bedelia Person, MD   REFERRING DIAG: [phone] (ICD-10-CM) - Spondylosis without myelopathy or radiculopathy, lumbar region   Rationale for Evaluation and Treatment: Rehabilitation  THERAPY DIAG:  Chronic bilateral low back pain with right-sided sciatica  Abnormal posture  Other abnormalities of gait and mobility  ONSET DATE: 20 years of back pain/ scoliosis,  recently pain in back and continuing to bend back  SUBJECTIVE:                                                                                                                                                                                           SUBJECTIVE STATEMENT: 09/05/2023 Pt states he is starting to feel a bit stronger, did have a bit of a hectic weekend with his spouse being sick. Has noticed a bit more R hip pain over the past couple days but does not feel it  is related to activity last session, states he felt good after last session with some work out fatigue. Has tried a postural brace with good support, noted improved tolerance to standing/walking. 3/10 at present in R hip, upper back, and low back on R; pt attributes some of it to weather.    EVAL-  My wife is Brandon Gordon PT  at [employer].  I am now in a job in which I have to bend over a desk.  I am 6'6"   I played basketball [address], [address] and [employer] in college.  Pain in back  is overwhelming. I have a special needs son. I have been having pain that really effects my life. I have severe scoliosis and a hx of compressed L4 L5 . I feel like the hunchback of [facility].  I also swim at [facility]  and the [facility] in winter.  I have been in a new job with about 8 hours of sitting.   I do not have a standing desk. I work 40% in office and 60% at home.  I have had dry needling and it was helpful and water. 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  I use 20lb  DB to do bench press etc.   Pt with long history of back pain and lifelong dx of scoliosis  PERTINENT HISTORY:  Chronic pain syndrome, hyperlipidemia, HTN, OA DM Type 2, Pain managament. THR R 12 years ago May 2012.  Left shoulder hypermobility and chronic dislocation since basketball days. Surgery of 5th toe infection March 2003. Anxiety  PAIN:  Are you having pain? Yes: NPRS scale: at rest 3/10 and  7/10 Pain location: low back radiating into R buttock and thoracic pain at scoliotic curve Pain description: sharp constant and spasming Aggravating factors: sitting for longer than 30 min, walking for longer  than 5-8 min and begin spasm Relieving factors: Rest Household chores and carrying laundry, any walking  and unable to do any gold swing  PRECAUTIONS: None  RED FLAGS: Bowel or bladder incontinence: No and Spinal tumors: No   WEIGHT BEARING RESTRICTIONS: No  FALLS:  Has patient fallen in last 6 months? 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  PLOF: Independent  PATIENT GOALS: To be able to walk again and try to play golf , try to strengthen safely  NEXT MD VISIT: TBD  OBJECTIVE: (objective measures completed at initial evaluation unless otherwise dated)   DIAGNOSTIC FINDINGS:  Recently taken at [facility]  PATIENT SURVEYS:  FOTO 47% Predicted 56%  SCREENING FOR RED FLAGS: Bowel or bladder incontinence: No Spinal tumors: No   COGNITION: Overall cognitive status: Within functional limits for tasks assessed     SENSATION: No numbness but sometimes tingling on top of feet and I sometimes have fasciculations  MUSCLE LENGTH: :Thomas test Right  WNL able to flatten deg; Left -32 from horizontal deg  Hamstrings: Right 74 deg; Left 69 deg  1/2 range of bridge only  POSTURE: rounded shoulders, forward head, and scoliosis with R elevated pelvis and scoliotic curve leaning to the right in sitting and standing with flexed and  rotated to Left posture  PALPATION: Pt with tightened Left QL, tightened bil hamstrings and  especially Left hip flexor.    LUMBAR ROM: P! - Pain severity  AROM eval  Flexion Finger tips to mid shins   Extension -15 extension P! Unable to come to neutral ext  Right lateral flexion Finger tip 1.5 inch above knee jt line P!  Left lateral flexion Finger tip to fibular  head   Right rotation 50%  Left rotation 50% Stands in left rotation   (Blank rows = not tested)  LOWER EXTREMITY ROM:     Active  Right eval Left eval  Hip flexion Supine 120 Supine 100 unable to come to neutral  Hip extension    Hip abduction    Hip adduction    Hip internal rotation 22P!!! 12 P!!!  Hip external rotation 60 55  Knee  flexion    Knee extension    Ankle dorsiflexion    Ankle plantarflexion    Ankle inversion    Ankle eversion     (Blank rows = not tested)  LOWER EXTREMITY MMT:    MMT Right eval Left eval  Hip flexion 4+ 4+  Hip extension 3-/ restricted  motion unable to come to neutral   Hip abduction 3+ 4-  Hip adduction    Hip internal rotation    Hip external rotation    Knee flexion 5 4+  Knee extension 5 5  Ankle dorsiflexion    Ankle plantarflexion    Ankle inversion    Ankle eversion     (Blank rows = not tested)   FUNCTIONAL TESTS:  5 times sit to stand: 17.95 sec  GAIT: Distance walked: 150 feet Assistive device utilized: None Level of assistance: Complete Independence Comments: Ambulates with flexed posture  with left and rotation to left posture.  Pt with left pelvic level higher than Right and concave scoliosis curve on Right  TODAY'S TREATMENT:    [facility] Adult PT Treatment:                                                DATE: 09/05/23 Therapeutic Exercise: Suitcase carry 15# 6x77ft cues for upright posture, RUE only  Seated high>low row CC 10# x15 BIL (performed unilaterally) Seated high>low row with blue band unilat, x8 BIL education for home setup Standing L hip flexor stretch + contralateral trunk rotation x10; use of treadmill for LUE support, cueing for pelvic positioning Standing L hip flexor stretch + ipsilateral UE reach x10; use of treadmill for RUE support, cueing for pelvic positioning Continuing to spend time with education/discussion re: relevant anatomy/physiology, exercise tolerance, rationale for interventions    [facility] Adult PT Treatment:                                                DATE: 08/31/23 Therapeutic Exercise: LTR x10  Open book x10 R side only (L sidelying) L QL stretch w/ physioball fulcrum, 2x8 CGA given mild instability during lean, increased time for setup/performance, cues for appropriate ball height at hip Standing L hip flexor stretch  + contralateral trunk rotation 2x10; use of treadmill for LUE support, cueing for pelvic positioning Significant time spent w/ education/discussion re: pt exercise outside of session, relevant anatomy/physiology as it pertains to exercise, and rationale for interventions    [facility] Adult PT Treatment:                                                DATE: 08-28-23 Therapeutic Exercise: Left  hip flexor stretch 3 x 30 sec hold Supine LTR Standing on box and elongating Left side while holding onto door frame Manual Therapy: STW to all TPDN muscles with decreased muscle tension Trigger Point Dry-Needling performed     by Garen Lah Treatment instructions: Expect mild to moderate muscle soreness. S/S of pneumothorax if dry needled over a lung field, and to seek immediate medical attention should they occur. Patient verbalized understanding of these instructions and education.  Patient Consent Given: Yes Education handout provided: Previously provided Muscles treated  left QL  thoracic left T -4 to T-8 , Left UT, Left infraspinatus Electrical stimulation performed: No Parameters: N/A Treatment response/outcome: twitch response noted, pt noted relief  Self Care: Educaton on curve and scoliosis and rotational curve                                                                                                                              DATE: 08-24-23  Eval    PATIENT EDUCATION:  Education details: rationale for interventions Gordon educated: Patient Education method: Explanation, Demonstration, Tactile cues, and Verbal cues Education comprehension: verbalized understanding, returned demonstration, verbal cues required, and tactile cues required  HOME EXERCISE PROGRAM:  Access Code: [email or web address] URL: [email or web address] Date: 09/05/2023 Prepared by: Fransisco Hertz  Program Notes - with lat pull down, please keep elbows close to body, and do one arm at a time  Exercises -  Hip Flexor Stretch at Edge of Bed  - 1 x daily - 7 x weekly - 1 sets - 3 reps -  30-60 sec hold - Supine Lower Trunk Rotation  - 2-3 x daily - 7 x weekly - 1 sets - 5 reps - Seated Lat Pull Down with Resistance - Elbows Bent  - 2-3 x daily - 7 x weekly - 1 sets - 8 reps  ASSESSMENT:  CLINICAL IMPRESSION: 09/05/2023 Pt arrives w/ 3/10 pain on NPS in R hip and low back, upper back. Continuing to spend significant time w/ education/discussion re: relevant anatomy/physiology particularly in regards to posture and rationale for interventions. We are able to progress today with incorporation of high>low rows which pt tolerates well, noted subjective weakness in L shoulder compared to R. Education on setup for home performance. Pt reports "3.2/10" pain on departure, states he continues to feel good with exercises. Recommend continuing along current POC in order to address relevant deficits and improve functional tolerance. Pt departs today's session in no acute distress, all voiced questions/concerns addressed appropriately from PT perspective.      EVAL- Patient is a 52 y.o. male who was seen today for physical therapy evaluation and treatment for spondylosis  and moderate scoliosis with flexed posture and scoliosis rotated to left with convex on Right. Including R sided sciatic pain.  Pt has has long history of back pain and lifetime dx of scoliosis.  He has thoracic.lumbar spasming into R buttock.  Pt with flexibility issues contributing to pain.He has  a job In which he sits and works on [employer] also contributing to his over flexed posture. Pt would benefit form standing desk and HEP/helps to decrease seated time. Pt will benefit from skilled PT to reduce pain and increase strength and flexibility to return to as neutral as possible. Pt needs to care for special needs son. He is limited due to pain in daily sedentary job and with sleep at night. He desires to return to a more active lifestyle playing racquet  sports and golf as able but his sedentary job adds to his flexed and scoliotic posture. Will continue to maximize AROM and strength as able to add more varied leisure activities     OBJECTIVE IMPAIRMENTS: decreased activity tolerance, decreased knowledge of condition, difficulty walking, decreased ROM, decreased strength, increased fascial restrictions, improper body mechanics, postural dysfunction, and obesity.   ACTIVITY LIMITATIONS: carrying, bending, sitting, standing, squatting, sleeping, stairs, locomotion level, and stairs  PARTICIPATION LIMITATIONS: driving, community activity, occupation, and caring for special needs son  PERSONAL FACTORS: Chronic pain syndrome, hyperlipidemia, HTN, OA DM Type 2, Pain managament. THR R 12 years ago May 2012.  Left shoulder hypermobility and chronic dislocation since basketball days. Surgery of 5th toe infection March 2003. Anxiety are also affecting patient's functional outcome.   REHAB POTENTIAL: Good  CLINICAL DECISION MAKING: Evolving/moderate complexity  EVALUATION COMPLEXITY: Moderate   GOALS: Goals reviewed with patient? Yes  SHORT TERM GOALS: Target date: 09-14-23  Pt will be independent with initial HEP Baseline:no knowledge Goal status: INITIAL  2.  Pt will work on sleep positions to increase more comfort in order to have at least 5 or more hours of restorative sleep Baseline:  tosses and turns at night and has increased pain Goal status: INITIAL  3.  Demonstrate understanding of  as neutral posture as possible with scoliosis and be more conscious of position and posture throughout the day.  Baseline: no knowledge Goal status: INITIAL  4.  Pain with going up and down steps with maximum safety Baseline: Pt descends steps backwards due to size 16 shoe Goal status: INITIAL   LONG TERM GOALS: Target date: 10-05-23  Pt will be independent with advanced HEP using gym equipment Baseline:  Goal status: INITIAL  2.  Pt will be  able to walk for  25 minutes using pain modulation Baseline: Can only walk for 5-8 minutes Goal status: INITIAL  3.  Pt will be able to pick up 25 # KB with proper form for exercise Baseline:  Goal status: INITIAL  4.  Pt will be able to improve flexibility in order to try to begin racquet sports /golf as able.  Baseline: unable to participate now Goal status: INITIAL  5.  Pain with activity will decrease  to 3/10 instead of 7/10 Baseline: At worse with functional activity 7/10 Goal status: INITIAL  6.  Pt will simulate golfing/ racquet sports and add strengthening/flexibility exercises that will enhance ability Baseline: Unable to  play sports at present due to spasming Goal status: INITIAL  PLAN:  PT FREQUENCY: 1-2x/week  PT DURATION: 6 weeks  PLANNED INTERVENTIONS: Therapeutic exercises, Therapeutic activity, Neuromuscular re-education, Balance training, Gait training, Patient/Family education, Self Care, Joint mobilization, Stair training, Dry Needling, Electrical stimulation, Spinal mobilization, Cryotherapy, Moist heat, Taping, Ionotophoresis 4mg /ml Dexamethasone, Manual therapy, and Re-evaluation.  PLAN FOR NEXT SESSION: review/update HEP PRN. Continue with multiplanar rotational activities, hip flexor mobility, working into postural extension.    Ashley Murrain PT, DPT 09/05/2023 3:52 PM

## 2023-09-05 ENCOUNTER — Ambulatory Visit: Payer: 59 | Admitting: Physical Therapy

## 2023-09-05 ENCOUNTER — Encounter: Payer: Self-pay | Admitting: Physical Therapy

## 2023-09-05 DIAGNOSIS — G8929 Other chronic pain: Secondary | ICD-10-CM

## 2023-09-05 DIAGNOSIS — R293 Abnormal posture: Secondary | ICD-10-CM | POA: Diagnosis not present

## 2023-09-05 DIAGNOSIS — R2689 Other abnormalities of gait and mobility: Secondary | ICD-10-CM | POA: Diagnosis not present

## 2023-09-05 DIAGNOSIS — M5441 Lumbago with sciatica, right side: Secondary | ICD-10-CM | POA: Diagnosis not present

## 2023-09-06 ENCOUNTER — Other Ambulatory Visit (HOSPITAL_COMMUNITY): Payer: Self-pay

## 2023-09-06 NOTE — Therapy (Signed)
OUTPATIENT PHYSICAL THERAPY TREATMENT NOTE   Patient Name: Brandon Gordon MRN: [id number] DOB:July 03, 1971, 52 y.o., male Today's Date: 09/07/2023  END OF SESSION:  PT End of Session - 09/07/23 1359     Visit Number 5    Number of Visits 13    Date for PT Re-Evaluation 10/05/23    Authorization Type [place] [employer]    PT Start Time 1400    PT Stop Time 1444    PT Time Calculation (min) 44 min    Activity Tolerance Patient tolerated treatment well    Behavior During Therapy WFL for tasks assessed/performed                Past Medical History:  Diagnosis Date   Anxiety    At risk for sleep apnea    STOP-BANG= 5      SENT TO PCP 04-05-2016   Chronic pain syndrome    History of kidney stones    Hyperlipidemia    Hypertension    Left ureteral stone    OA (osteoarthritis)    back, hip   Pain management    Type 2 diabetes mellitus (HCC)    Past Surgical History:  Procedure Laterality Date   TOTAL HIP ARTHROPLASTY Right 04-22-2011   There are no problems to display for this patient.   PCP: Noberto Retort, MD   REFERRING PROVIDER: Bedelia Person, MD   REFERRING DIAG: [phone] (ICD-10-CM) - Spondylosis without myelopathy or radiculopathy, lumbar region   Rationale for Evaluation and Treatment: Rehabilitation  THERAPY DIAG:  Chronic bilateral low back pain with right-sided sciatica  Abnormal posture  Other abnormalities of gait and mobility  ONSET DATE: 20 years of back pain/ scoliosis,  recently pain in back and continuing to bend back  SUBJECTIVE:                                                                                                                                                                                           SUBJECTIVE STATEMENT: 09/07/2023 Pt states he is having about 2/10 pain in L low back and hip. Having a bit of upper back and neck tightness after last session. Felt a little soreness after last session but not too  significant.   EVAL-  My wife is [name] PT at [employer]  I am now in a job in which I have to bend over a desk.  I am 6'6"   I played basketball [address], [address] and [employer] in college.  Pain in back  is overwhelming. I have a special needs son. I have been having pain that really effects my  life. I have severe scoliosis and a hx of compressed L4 L5 . I feel like the hunchback of [facility].  I also swim at [facility]  and the [facility] in winter.  I have been in a new job with about 8 hours of sitting.   I do not have a standing desk. I work 40% in office and 60% at home.  I have had dry needling and it was helpful and water. 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  I use 20lb  DB to do bench press etc.   Pt with long history of back pain and lifelong dx of scoliosis  PERTINENT HISTORY:  Chronic pain syndrome, hyperlipidemia, HTN, OA DM Type 2, Pain managament. THR R 12 years ago May 2012.  Left shoulder hypermobility and chronic dislocation since basketball days. Surgery of 5th toe infection March 2003. Anxiety  PAIN:  Are you having pain? Yes: NPRS scale: at rest 3/10 and  7/10 Pain location: low back radiating into R buttock and thoracic pain at scoliotic curve Pain description: sharp constant and spasming Aggravating factors: sitting for longer than 30 min, walking for longer  than 5-8 min and begin spasm Relieving factors: Rest Household chores and carrying laundry, any walking  and unable to do any gold swing  PRECAUTIONS: None  RED FLAGS: Bowel or bladder incontinence: No and Spinal tumors: No   WEIGHT BEARING RESTRICTIONS: No  FALLS:  Has patient fallen in last 6 months? 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  PLOF: Independent  PATIENT GOALS: To be able to walk again and try to play golf , try to strengthen safely  NEXT MD VISIT: TBD  OBJECTIVE: (objective measures completed at initial evaluation unless otherwise dated)   DIAGNOSTIC FINDINGS:  Recently taken at [facility] medical  PATIENT SURVEYS:  FOTO 47% Predicted 56%  SCREENING FOR RED FLAGS: Bowel or bladder incontinence: No Spinal tumors: No   COGNITION: Overall cognitive status: Within functional limits for tasks assessed     SENSATION: No numbness but sometimes tingling on top of feet and I sometimes have fasciculations  MUSCLE LENGTH: :Thomas test Right  WNL able to flatten deg; Left -32 from horizontal deg  Hamstrings: Right 74 deg; Left 69 deg  1/2 range of bridge only  POSTURE: rounded shoulders, forward head, and scoliosis with R elevated pelvis and scoliotic curve leaning to the right in sitting and standing with flexed and  rotated to Left posture  PALPATION: Pt with tightened Left QL, tightened bil hamstrings and  especially Left hip flexor.    LUMBAR ROM: P! - Pain severity  AROM eval  Flexion Finger tips to mid shins   Extension -15 extension P! Unable to come to neutral ext  Right lateral flexion Finger tip 1.5 inch above knee jt line P!  Left lateral flexion Finger tip to fibular head   Right rotation 50%  Left rotation 50% Stands in left rotation   (Blank rows = not tested)  LOWER EXTREMITY ROM:     Active  Right eval Left eval  Hip flexion Supine 120 Supine 100 unable to come to neutral  Hip extension    Hip abduction  Hip adduction    Hip internal rotation 22P!!! 12 P!!!  Hip external rotation 60 55  Knee flexion    Knee extension    Ankle dorsiflexion    Ankle plantarflexion    Ankle inversion    Ankle eversion     (Blank rows = not tested)  LOWER EXTREMITY MMT:    MMT Right eval Left eval  Hip flexion 4+ 4+  Hip extension 3-/ restricted  motion unable to come to  neutral   Hip abduction 3+ 4-  Hip adduction    Hip internal rotation    Hip external rotation    Knee flexion 5 4+  Knee extension 5 5  Ankle dorsiflexion    Ankle plantarflexion    Ankle inversion    Ankle eversion     (Blank rows = not tested)   FUNCTIONAL TESTS:  5 times sit to stand: 17.95 sec  GAIT: Distance walked: 150 feet Assistive device utilized: None Level of assistance: Complete Independence Comments: Ambulates with flexed posture  with left and rotation to left posture.  Pt with left pelvic level higher than Right and concave scoliosis curve on Right  TODAY'S TREATMENT:    [facility] Adult PT Treatment:                                                DATE: 09/07/23 Therapeutic Exercise: Banded scaption red band around wrists, 2x8; emphasis on posture, and consistent tension Standing triple flexion/extension LLE only, RUE support on counter 2x8 cues for breath control and posture CC R rotational row 10# x8 with LUE support (ski pole) heavy tactile cues to reduce compensations CC R rotational row 7# x8 with L UE support Continued education/discussion re: HEP and appropriate exercise   [facility] Adult PT Treatment:                                                DATE: 09/05/23 Therapeutic Exercise: Suitcase carry 15# 6x2ft cues for upright posture, RUE only  Seated high>low row CC 10# x15 BIL (performed unilaterally) Seated high>low row with blue band unilat, x8 BIL education for home setup Standing L hip flexor stretch + contralateral trunk rotation x10; use of treadmill for LUE support, cueing for pelvic positioning Standing L hip flexor stretch + ipsilateral UE reach x10; use of treadmill for RUE support, cueing for pelvic positioning Continuing to spend time with education/discussion re: relevant anatomy/physiology, exercise tolerance, rationale for interventions    [facility] Adult PT Treatment:                                                DATE: 08/31/23 Therapeutic  Exercise: LTR x10  Open book x10 R side only (L sidelying) L QL stretch w/ physioball fulcrum, 2x8 CGA given mild instability during lean, increased time for setup/performance, cues for appropriate ball height at hip Standing L hip flexor stretch + contralateral trunk rotation 2x10; use of treadmill for LUE support, cueing for pelvic positioning Significant time spent w/ education/discussion re: pt exercise outside of session, relevant anatomy/physiology as it pertains to exercise, and  rationale for interventions    [facility] Adult PT Treatment:                                                DATE: 08-28-23 Therapeutic Exercise: Left hip flexor stretch 3 x 30 sec hold Supine LTR Standing on box and elongating Left side while holding onto door frame Manual Therapy: STW to all TPDN muscles with decreased muscle tension Trigger Point Dry-Needling performed     by Garen Lah Treatment instructions: Expect mild to moderate muscle soreness. S/S of pneumothorax if dry needled over a lung field, and to seek immediate medical attention should they occur. Patient verbalized understanding of these instructions and education.  Patient Consent Given: Yes Education handout provided: Previously provided Muscles treated  left QL  thoracic left T -4 to T-8 , Left UT, Left infraspinatus Electrical stimulation performed: No Parameters: N/A Treatment response/outcome: twitch response noted, pt noted relief  Self Care: Educaton on curve and scoliosis and rotational curve                                                                                                                              DATE: 08-24-23  Eval    PATIENT EDUCATION:  Education details: rationale for interventions Person educated: Patient Education method: Explanation, Demonstration, Tactile cues, and Verbal cues Education comprehension: verbalized understanding, returned demonstration, verbal cues required, and tactile cues  required  HOME EXERCISE PROGRAM:  Access Code: [email or web address] URL: [email or web address] Date: 09/05/2023 Prepared by: Fransisco Hertz  Program Notes - with lat pull down, please keep elbows close to body, and do one arm at a time  Exercises - Hip Flexor Stretch at Edge of Bed  - 1 x daily - 7 x weekly - 1 sets - 3 reps -  30-60 sec hold - Supine Lower Trunk Rotation  - 2-3 x daily - 7 x weekly - 1 sets - 5 reps - Seated Lat Pull Down with Resistance - Elbows Bent  - 2-3 x daily - 7 x weekly - 1 sets - 8 reps  ASSESSMENT:  CLINICAL IMPRESSION: 09/07/2023 Pt arrives w/ report of 2/10 pain, minimal soreness after last session. Today we continue to work on trying to improve postural variability given tendency towards fwd flexed trunk, rotated towards L. Also incorporating banded scaption for shoulder stability and postural stability which pt tolerates well although he does report some muscular fatigue. Tolerates mobility exercises well with cues as above, also continuing to progress rotational and multiplanar strengthening as able. No adverse events, pt reports 3/10 on departure. Recommend continuing along current POC in order to address relevant deficits and improve functional tolerance. Pt departs today's session in no acute distress, all voiced questions/concerns addressed appropriately from PT perspective.      EVAL- Patient is a 52 y.o. male who  was seen today for physical therapy evaluation and treatment for spondylosis  and moderate scoliosis with flexed posture and scoliosis rotated to left with convex on Right. Including R sided sciatic pain.  Pt has has long history of back pain and lifetime dx of scoliosis.  He has thoracic.lumbar spasming into R buttock.  Pt with flexibility issues contributing to pain.He has a job In which he sits and works on [employer] also contributing to his over flexed posture. Pt would benefit form standing desk and HEP/helps to decrease seated time. Pt will  benefit from skilled PT to reduce pain and increase strength and flexibility to return to as neutral as possible. Pt needs to care for special needs son. He is limited due to pain in daily sedentary job and with sleep at night. He desires to return to a more active lifestyle playing racquet sports and golf as able but his sedentary job adds to his flexed and scoliotic posture. Will continue to maximize AROM and strength as able to add more varied leisure activities     OBJECTIVE IMPAIRMENTS: decreased activity tolerance, decreased knowledge of condition, difficulty walking, decreased ROM, decreased strength, increased fascial restrictions, improper body mechanics, postural dysfunction, and obesity.   ACTIVITY LIMITATIONS: carrying, bending, sitting, standing, squatting, sleeping, stairs, locomotion level, and stairs  PARTICIPATION LIMITATIONS: driving, community activity, occupation, and caring for special needs son  PERSONAL FACTORS: Chronic pain syndrome, hyperlipidemia, HTN, OA DM Type 2, Pain managament. THR R 12 years ago May 2012.  Left shoulder hypermobility and chronic dislocation since basketball days. Surgery of 5th toe infection March 2003. Anxiety are also affecting patient's functional outcome.   REHAB POTENTIAL: Good  CLINICAL DECISION MAKING: Evolving/moderate complexity  EVALUATION COMPLEXITY: Moderate   GOALS: Goals reviewed with patient? Yes  SHORT TERM GOALS: Target date: 09-14-23  Pt will be independent with initial HEP Baseline:no knowledge Goal status: INITIAL  2.  Pt will work on sleep positions to increase more comfort in order to have at least 5 or more hours of restorative sleep Baseline:  tosses and turns at night and has increased pain Goal status: INITIAL  3.  Demonstrate understanding of  as neutral posture as possible with scoliosis and be more conscious of position and posture throughout the day.  Baseline: no knowledge Goal status: INITIAL  4.  Pain  with going up and down steps with maximum safety Baseline: Pt descends steps backwards due to size 16 shoe Goal status: INITIAL   LONG TERM GOALS: Target date: 10-05-23  Pt will be independent with advanced HEP using gym equipment Baseline:  Goal status: INITIAL  2.  Pt will be able to walk for  25 minutes using pain modulation Baseline: Can only walk for 5-8 minutes Goal status: INITIAL  3.  Pt will be able to pick up 25 # KB with proper form for exercise Baseline:  Goal status: INITIAL  4.  Pt will be able to improve flexibility in order to try to begin racquet sports /golf as able.  Baseline: unable to participate now Goal status: INITIAL  5.  Pain with activity will decrease  to 3/10 instead of 7/10 Baseline: At worse with functional activity 7/10 Goal status: INITIAL  6.  Pt will simulate golfing/ racquet sports and add strengthening/flexibility exercises that will enhance ability Baseline: Unable to  play sports at present due to spasming Goal status: INITIAL  PLAN:  PT FREQUENCY: 1-2x/week  PT DURATION: 6 weeks  PLANNED INTERVENTIONS: Therapeutic exercises, Therapeutic activity, Neuromuscular  re-education, Balance training, Gait training, Patient/Family education, Self Care, Joint mobilization, Stair training, Dry Needling, Electrical stimulation, Spinal mobilization, Cryotherapy, Moist heat, Taping, Ionotophoresis 4mg /ml Dexamethasone, Manual therapy, and Re-evaluation.  PLAN FOR NEXT SESSION: review/update HEP PRN. Continue with multiplanar rotational activities, hip flexor mobility, working into postural extension.     Ashley Murrain PT, DPT 09/07/2023 4:45 PM

## 2023-09-07 ENCOUNTER — Ambulatory Visit: Payer: 59 | Admitting: Physical Therapy

## 2023-09-07 ENCOUNTER — Encounter: Payer: Self-pay | Admitting: Physical Therapy

## 2023-09-07 ENCOUNTER — Other Ambulatory Visit (HOSPITAL_COMMUNITY): Payer: Self-pay

## 2023-09-07 DIAGNOSIS — R2689 Other abnormalities of gait and mobility: Secondary | ICD-10-CM

## 2023-09-07 DIAGNOSIS — M5441 Lumbago with sciatica, right side: Secondary | ICD-10-CM | POA: Diagnosis not present

## 2023-09-07 DIAGNOSIS — R293 Abnormal posture: Secondary | ICD-10-CM | POA: Diagnosis not present

## 2023-09-07 DIAGNOSIS — G8929 Other chronic pain: Secondary | ICD-10-CM

## 2023-09-07 MED ORDER — METOPROLOL SUCCINATE ER 100 MG PO TB24
100.0000 mg | ORAL_TABLET | Freq: Every day | ORAL | 0 refills | Status: DC
  Filled 2023-09-07: qty 90, 90d supply, fill #0

## 2023-09-07 MED ORDER — VILAZODONE HCL 40 MG PO TABS
40.0000 mg | ORAL_TABLET | Freq: Every day | ORAL | 0 refills | Status: DC
  Filled 2023-09-07 – 2023-11-15 (×2): qty 90, 90d supply, fill #0

## 2023-09-11 ENCOUNTER — Encounter (HOSPITAL_COMMUNITY): Payer: Self-pay

## 2023-09-11 ENCOUNTER — Other Ambulatory Visit (HOSPITAL_COMMUNITY): Payer: Self-pay

## 2023-09-18 ENCOUNTER — Encounter: Payer: Self-pay | Admitting: Physical Therapy

## 2023-09-18 ENCOUNTER — Ambulatory Visit: Payer: 59 | Admitting: Physical Therapy

## 2023-09-18 DIAGNOSIS — R2689 Other abnormalities of gait and mobility: Secondary | ICD-10-CM

## 2023-09-18 DIAGNOSIS — R293 Abnormal posture: Secondary | ICD-10-CM

## 2023-09-18 DIAGNOSIS — M5441 Lumbago with sciatica, right side: Secondary | ICD-10-CM | POA: Diagnosis not present

## 2023-09-18 DIAGNOSIS — G8929 Other chronic pain: Secondary | ICD-10-CM

## 2023-09-18 NOTE — Therapy (Signed)
OUTPATIENT PHYSICAL THERAPY TREATMENT NOTE   Patient Name: Brandon Gordon MRN: [id number] DOB:06-24-1971, 52 y.o., male Today's Date: 09/18/2023  END OF SESSION:  PT End of Session - 09/18/23 1314     Visit Number 6    Number of Visits 13    Date for PT Re-Evaluation 10/05/23    Authorization Type [employer]    PT Start Time 1316    PT Stop Time 1405    PT Time Calculation (min) 49 min    Activity Tolerance Patient tolerated treatment well    Behavior During Therapy WFL for tasks assessed/performed                Past Medical History:  Diagnosis Date   Anxiety    At risk for sleep apnea    STOP-BANG= 5      SENT TO PCP 04-05-2016   Chronic pain syndrome    History of kidney stones    Hyperlipidemia    Hypertension    Left ureteral stone    OA (osteoarthritis)    back, hip   Pain management    Type 2 diabetes mellitus (HCC)    Past Surgical History:  Procedure Laterality Date   TOTAL HIP ARTHROPLASTY Right 04-22-2011   There are no problems to display for this patient.   PCP: Noberto Retort, MD   REFERRING PROVIDER: Bedelia Person, MD   REFERRING DIAG: [phone] (ICD-10-CM) - Spondylosis without myelopathy or radiculopathy, lumbar region   Rationale for Evaluation and Treatment: Rehabilitation  THERAPY DIAG:  Chronic bilateral low back pain with right-sided sciatica  Abnormal posture  Other abnormalities of gait and mobility  ONSET DATE: 20 years of back pain/ scoliosis,  recently pain in back and continuing to bend back  SUBJECTIVE:                                                                                                                                                                                           SUBJECTIVE STATEMENT: 09/18/2023 Pt states he felt good after last session, did have some fatigue afterwards that he compares to having a hard workout. Does note some difficulty doing HEP due to fatigue with his travels,  did have some increased pain as well. A bit sore today which he attributes to foam rolling and exercises over the weekend.     EVAL-  My wife is [name] PT at [employer]  I am now in a job in which I have to bend over a desk.  I am 6'6"   I played basketball [address], [address] and [employer] in college.  Pain  in back  is overwhelming. I have a special needs son. I have been having pain that really effects my life. I have severe scoliosis and a hx of compressed L4 L5 . I feel like the hunchback of [facility].  I also swim at [facility]  and the [facility] in winter.  I have been in a new job with about 8 hours of sitting.   I do not have a standing desk. I work 40% in office and 60% at home.  I have had dry needling and it was helpful and water. 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  I use 20lb  DB to do bench press etc.   Pt with long history of back pain and lifelong dx of scoliosis  PERTINENT HISTORY:  Chronic pain syndrome, hyperlipidemia, HTN, OA DM Type 2, Pain managament. THR R 12 years ago May 2012.  Left shoulder hypermobility and chronic dislocation since basketball days. Surgery of 5th toe infection March 2003. Anxiety  PAIN:  Are you having pain? Yes: NPRS scale: at rest 3/10 and  7/10 Pain location: low back radiating into R buttock and thoracic pain at scoliotic curve Pain description: sharp constant and spasming Aggravating factors: sitting for longer than 30 min, walking for longer  than 5-8 min and begin spasm Relieving factors: Rest Household chores and carrying laundry, any walking  and unable to do any gold swing  PRECAUTIONS: None  RED FLAGS: Bowel or bladder incontinence: No and Spinal tumors: No   WEIGHT BEARING RESTRICTIONS: No  FALLS:  Has patient fallen in last 6 months? 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  PLOF: Independent  PATIENT GOALS: To be able to walk again and try to play golf , try to strengthen safely  NEXT MD VISIT: TBD  OBJECTIVE: (objective measures completed at initial evaluation unless otherwise dated)   DIAGNOSTIC FINDINGS:  Recently taken at [facility] medical  PATIENT SURVEYS:  FOTO 47% Predicted 56%  SCREENING FOR RED FLAGS: Bowel or bladder incontinence: No Spinal tumors: No   COGNITION: Overall cognitive status: Within functional limits for tasks assessed     SENSATION: No numbness but sometimes tingling on top of feet and I sometimes have fasciculations  MUSCLE LENGTH: :Thomas test Right  WNL able to flatten deg; Left -32 from horizontal deg  Hamstrings: Right 74 deg; Left 69 deg  1/2 range of bridge only  POSTURE: rounded shoulders, forward head, and scoliosis with R elevated pelvis and scoliotic curve leaning to the right in sitting and standing with flexed and  rotated to Left posture  PALPATION: Pt with tightened Left QL, tightened bil hamstrings and  especially Left hip flexor.    LUMBAR ROM: P! - Pain severity  AROM eval  Flexion Finger tips to mid shins   Extension -15 extension P! Unable to come to neutral ext  Right lateral flexion Finger tip 1.5 inch above knee jt line P!  Left lateral flexion Finger tip to fibular head   Right rotation 50%  Left rotation 50% Stands in left rotation   (Blank rows = not tested)  LOWER EXTREMITY ROM:     Active  Right eval Left eval  Hip flexion Supine 120 Supine 100 unable to come to neutral  Hip extension    Hip abduction    Hip adduction    Hip internal rotation 22P!!! 12 P!!!  Hip external rotation 60 55  Knee flexion    Knee extension    Ankle dorsiflexion    Ankle plantarflexion    Ankle inversion    Ankle eversion     (Blank rows = not tested)  LOWER EXTREMITY  MMT:    MMT Right eval Left eval  Hip flexion 4+ 4+  Hip extension 3-/ restricted  motion unable to come to neutral   Hip abduction 3+ 4-  Hip adduction    Hip internal rotation    Hip external rotation    Knee flexion 5 4+  Knee extension 5 5  Ankle dorsiflexion    Ankle plantarflexion    Ankle inversion    Ankle eversion     (Blank rows = not tested)   FUNCTIONAL TESTS:  5 times sit to stand: 17.95 sec  GAIT: Distance walked: 150 feet Assistive device utilized: None Level of assistance: Complete Independence Comments: Ambulates with flexed posture  with left and rotation to left posture.  Pt with left pelvic level higher than Right and concave scoliosis curve on Right  TODAY'S TREATMENT:    [facility] Adult PT Treatment:                                                DATE: 09/18/23 Therapeutic Exercise: Hooklying breathing 3 sets of 5 breaths with emphasis on L ribcage expansion with tactile cues, deep breathing for muscular relaxation, education on pacing and normal breathing during rest breaks  7# unilat row CC RUE with rotation to R, CGA with staggered stance, cues for appropriate ROM/mechanics 3# punches CC LUE with rotation to R, cues for follow through and CGA given staggered stance L hip flexor isometric push 2x8 with UE support on wall  Standing L hip flexor stretch + contralateral trunk rotation x5; use of treadmill for LUE support, cueing for pelvic positioning and posture, head/neck positioning HEP update + education and provision of resistance bands, continued education/discussion on relevant anatomy/physiology as it pertains to pain and rationale for interventions    [facility] Adult PT Treatment:                                                DATE: 09/07/23 Therapeutic Exercise: Banded scaption red band around wrists, 2x8; emphasis on posture, and consistent tension Standing triple flexion/extension LLE only, RUE support on counter 2x8 cues for breath control and  posture CC R rotational row 10# x8 with LUE support (ski pole) heavy tactile cues to reduce compensations CC R rotational row 7# x8 with L UE support Continued education/discussion re: HEP and appropriate exercise   [facility] Adult PT Treatment:                                                DATE: 09/05/23 Therapeutic Exercise: Suitcase carry 15# 6x18ft cues for upright posture, RUE only  Seated high>low row CC  10# x15 BIL (performed unilaterally) Seated high>low row with blue band unilat, x8 BIL education for home setup Standing L hip flexor stretch + contralateral trunk rotation x10; use of treadmill for LUE support, cueing for pelvic positioning Standing L hip flexor stretch + ipsilateral UE reach x10; use of treadmill for RUE support, cueing for pelvic positioning Continuing to spend time with education/discussion re: relevant anatomy/physiology, exercise tolerance, rationale for interventions    [facility] Adult PT Treatment:                                                DATE: 08/31/23 Therapeutic Exercise: LTR x10  Open book x10 R side only (L sidelying) L QL stretch w/ physioball fulcrum, 2x8 CGA given mild instability during lean, increased time for setup/performance, cues for appropriate ball height at hip Standing L hip flexor stretch + contralateral trunk rotation 2x10; use of treadmill for LUE support, cueing for pelvic positioning Significant time spent w/ education/discussion re: pt exercise outside of session, relevant anatomy/physiology as it pertains to exercise, and rationale for interventions    [facility] Adult PT Treatment:                                                DATE: 08-28-23 Therapeutic Exercise: Left hip flexor stretch 3 x 30 sec hold Supine LTR Standing on box and elongating Left side while holding onto door frame Manual Therapy: STW to all TPDN muscles with decreased muscle tension Trigger Point Dry-Needling performed     by Garen Lah Treatment instructions:  Expect mild to moderate muscle soreness. S/S of pneumothorax if dry needled over a lung field, and to seek immediate medical attention should they occur. Patient verbalized understanding of these instructions and education.  Patient Consent Given: Yes Education handout provided: Previously provided Muscles treated  left QL  thoracic left T -4 to T-8 , Left UT, Left infraspinatus Electrical stimulation performed: No Parameters: N/A Treatment response/outcome: twitch response noted, pt noted relief  Self Care: Educaton on curve and scoliosis and rotational curve                                                                                                                              DATE: 08-24-23  Eval    PATIENT EDUCATION:  Education details: rationale for interventions Person educated: Patient Education method: Explanation, Demonstration, Tactile cues, and Verbal cues Education comprehension: verbalized understanding, returned demonstration, verbal cues required, and tactile cues required  HOME EXERCISE PROGRAM:  Access Code: [email or web address] URL: [email or web address] Date: 09/18/2023 Prepared by: Fransisco Hertz  Program Notes - with lat pull down, please keep elbows close to body, and do one arm at a time  Exercises - Seated Lat Pull Down with Resistance - Elbows Bent  - 2-3 x daily - 7 x weekly - 1 sets - 8 reps - Standing Single Arm Low Row with Anchored Resistance  - 2-3 x daily - 7 x weekly - 1 sets - 8 reps - Single Arm Punch with Resistance  - 2-3 x daily - 7 x weekly - 1 sets - 8 reps  ASSESSMENT:  CLINICAL IMPRESSION: 09/18/2023 Pt arrived w/ report of soreness from weekend activities, some fatigue after last session but no issues. He did mention that he has noticed difficulty relaxing upper back/trunk musculature and it seems to be affecting his ability to take a deep breath - given this, we worked on breathing in hooklying position with tactile cues to work on  expansion of L ribcage (given his rotation towards L), he noted improved perception of breathing afterwards with improved muscular relaxation. Subsequently we continued to work on unilateral push/pulls in staggered stance to improve postural variability. Also did well with hip flexor isometrics although stance limb did fatigue quickly. At end of session we looked at previously performed L hip flexor stretch and pt noted improved ease of rotation after today's interventions, compared to prior performance. HEP update as above, did advise pt to perform unilateral push/pulls with normal BOS for safety (requires CGA in clinic with staggered stance). No adverse events, pt departs with report of muscular fatigue but no increase in overt pain. Recommend continuing along current POC in order to address relevant deficits and improve functional tolerance. Pt departs today's session in no acute distress, all voiced questions/concerns addressed appropriately from PT perspective.       EVAL- Patient is a 52 y.o. male who was seen today for physical therapy evaluation and treatment for spondylosis  and moderate scoliosis with flexed posture and scoliosis rotated to left with convex on Right. Including R sided sciatic pain.  Pt has has long history of back pain and lifetime dx of scoliosis.  He has thoracic.lumbar spasming into R buttock.  Pt with flexibility issues contributing to pain.He has a job In which he sits and works on [employer] also contributing to his over flexed posture. Pt would benefit form standing desk and HEP/helps to decrease seated time. Pt will benefit from skilled PT to reduce pain and increase strength and flexibility to return to as neutral as possible. Pt needs to care for special needs son. He is limited due to pain in daily sedentary job and with sleep at night. He desires to return to a more active lifestyle playing racquet sports and golf as able but his sedentary job adds to his flexed and  scoliotic posture. Will continue to maximize AROM and strength as able to add more varied leisure activities     OBJECTIVE IMPAIRMENTS: decreased activity tolerance, decreased knowledge of condition, difficulty walking, decreased ROM, decreased strength, increased fascial restrictions, improper body mechanics, postural dysfunction, and obesity.   ACTIVITY LIMITATIONS: carrying, bending, sitting, standing, squatting, sleeping, stairs, locomotion level, and stairs  PARTICIPATION LIMITATIONS: driving, community activity, occupation, and caring for special needs son  PERSONAL FACTORS: Chronic pain syndrome, hyperlipidemia, HTN, OA DM Type 2, Pain managament. THR R 12 years ago May 2012.  Left shoulder hypermobility and chronic dislocation since basketball days. Surgery of 5th toe infection March 2003. Anxiety are also affecting patient's functional outcome.   REHAB POTENTIAL: Good  CLINICAL DECISION MAKING: Evolving/moderate complexity  EVALUATION COMPLEXITY: Moderate   GOALS: Goals reviewed with patient? Yes  SHORT TERM GOALS: Target date: 09-14-23  Pt will be independent with initial HEP Baseline:no knowledge Goal status: INITIAL  2.  Pt will work on sleep positions to increase more comfort in order to have at least 5 or more hours of restorative sleep Baseline:  tosses and turns at night and has increased pain Goal status: INITIAL  3.  Demonstrate understanding of  as neutral posture as possible with scoliosis and be more conscious of position and posture throughout the day.  Baseline: no knowledge Goal status: INITIAL  4.  Pain with going up and down steps with maximum safety Baseline: Pt descends steps backwards due to size 16 shoe Goal status: INITIAL   LONG TERM GOALS: Target date: 10-05-23  Pt will be independent with advanced HEP using gym equipment Baseline:  Goal status: INITIAL  2.  Pt will be able to walk for  25 minutes using pain modulation Baseline: Can only  walk for 5-8 minutes Goal status: INITIAL  3.  Pt will be able to pick up 25 # KB with proper form for exercise Baseline:  Goal status: INITIAL  4.  Pt will be able to improve flexibility in order to try to begin racquet sports /golf as able.  Baseline: unable to participate now Goal status: INITIAL  5.  Pain with activity will decrease  to 3/10 instead of 7/10 Baseline: At worse with functional activity 7/10 Goal status: INITIAL  6.  Pt will simulate golfing/ racquet sports and add strengthening/flexibility exercises that will enhance ability Baseline: Unable to  play sports at present due to spasming Goal status: INITIAL  PLAN:  PT FREQUENCY: 1-2x/week  PT DURATION: 6 weeks  PLANNED INTERVENTIONS: Therapeutic exercises, Therapeutic activity, Neuromuscular re-education, Balance training, Gait training, Patient/Family education, Self Care, Joint mobilization, Stair training, Dry Needling, Electrical stimulation, Spinal mobilization, Cryotherapy, Moist heat, Taping, Ionotophoresis 4mg /ml Dexamethasone, Manual therapy, and Re-evaluation.  PLAN FOR NEXT SESSION: review/update HEP PRN. Continue with multiplanar rotational activities, hip flexor mobility, working into postural extension. FOTO next session. Pt voices interest in continuing dry needling  Ashley Murrain PT, DPT 09/18/2023 2:22 PM

## 2023-09-20 DIAGNOSIS — N1832 Chronic kidney disease, stage 3b: Secondary | ICD-10-CM | POA: Diagnosis not present

## 2023-09-20 DIAGNOSIS — E118 Type 2 diabetes mellitus with unspecified complications: Secondary | ICD-10-CM | POA: Diagnosis not present

## 2023-09-20 DIAGNOSIS — F1120 Opioid dependence, uncomplicated: Secondary | ICD-10-CM | POA: Diagnosis not present

## 2023-09-21 ENCOUNTER — Ambulatory Visit: Payer: 59 | Attending: Neurosurgery | Admitting: Physical Therapy

## 2023-09-21 ENCOUNTER — Encounter: Payer: Self-pay | Admitting: Physical Therapy

## 2023-09-21 DIAGNOSIS — R2689 Other abnormalities of gait and mobility: Secondary | ICD-10-CM | POA: Diagnosis not present

## 2023-09-21 DIAGNOSIS — R293 Abnormal posture: Secondary | ICD-10-CM | POA: Diagnosis not present

## 2023-09-21 DIAGNOSIS — M5441 Lumbago with sciatica, right side: Secondary | ICD-10-CM | POA: Diagnosis not present

## 2023-09-21 DIAGNOSIS — G8929 Other chronic pain: Secondary | ICD-10-CM

## 2023-09-21 NOTE — Therapy (Signed)
OUTPATIENT PHYSICAL THERAPY TREATMENT NOTE   Patient Name: Brandon Gordon MRN: [id number] DOB:21-Jul-1971, 52 y.o., male Today's Date: 09/21/2023  END OF SESSION:  PT End of Session - 09/21/23 1333     Visit Number 7    Number of Visits 13    Date for PT Re-Evaluation 10/05/23    Authorization Type [employer]    PT Start Time [phone]    PT Stop Time 1415    PT Time Calculation (min) 42 min    Activity Tolerance Patient tolerated treatment well    Behavior During Therapy WFL for tasks assessed/performed                 Past Medical History:  Diagnosis Date   Anxiety    At risk for sleep apnea    STOP-BANG= 5      SENT TO PCP 04-05-2016   Chronic pain syndrome    History of kidney stones    Hyperlipidemia    Hypertension    Left ureteral stone    OA (osteoarthritis)    back, hip   Pain management    Type 2 diabetes mellitus (HCC)    Past Surgical History:  Procedure Laterality Date   TOTAL HIP ARTHROPLASTY Right 04-22-2011   There are no problems to display for this patient.   PCP: Noberto Retort, MD   REFERRING PROVIDER: Bedelia Person, MD   REFERRING DIAG: [phone] (ICD-10-CM) - Spondylosis without myelopathy or radiculopathy, lumbar region   Rationale for Evaluation and Treatment: Rehabilitation  THERAPY DIAG:  No diagnosis found.  ONSET DATE: 20 years of back pain/ scoliosis,  recently pain in back and continuing to bend back  SUBJECTIVE:                                                                                                                                                                                           SUBJECTIVE STATEMENT: 09/21/2023 Pt states he felt good  about 2/10 pain today.   Has been feeling good with the exercise    EVAL-  My wife is [name] PT at [employer].  I am now in a job in which I have to bend over a desk.  I am 6'6"   I played basketball [address], [address] and [employer] in college.  Pain in back  is overwhelming. I  have a special needs son. I have been having pain that really effects my life. I have severe scoliosis and a hx of compressed L4 L5 . I feel like the hunchback of [facility].  I also swim at [employer]  and the  [employer] in winter.  I have been in a new job with about 8 hours of sitting.   I do not have a standing desk. I work 40% in office and 60% at home.  I have had dry needling and it was helpful and water. 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  I use 20lb  DB to do bench press etc.   Pt with long history of back pain and lifelong dx of scoliosis  PERTINENT HISTORY:  Chronic pain syndrome, hyperlipidemia, HTN, OA DM Type 2, Pain managament. THR R 12 years ago May 2012.  Left shoulder hypermobility and chronic dislocation since basketball days. Surgery of 5th toe infection March 2003. Anxiety  PAIN:  Are you having pain? Yes: NPRS scale: at rest 3/10 and  7/10 Pain location: low back radiating into R buttock and thoracic pain at scoliotic curve Pain description: sharp constant and spasming Aggravating factors: sitting for longer than 30 min, walking for longer  than 5-8 min and begin spasm Relieving factors: Rest Household chores and carrying laundry, any walking  and unable to do any gold swing  PRECAUTIONS: None  RED FLAGS: Bowel or bladder incontinence: No and Spinal tumors: No   WEIGHT BEARING RESTRICTIONS: No  FALLS:  Has patient fallen in last 6 months? 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  PLOF: Independent  PATIENT GOALS: To be able to walk again and try to play golf , try to strengthen safely  NEXT MD VISIT: TBD  OBJECTIVE: (objective measures completed at initial evaluation  unless otherwise dated)   DIAGNOSTIC FINDINGS:  Recently taken at [facility]  PATIENT SURVEYS:  FOTO 47% Predicted 56%  SCREENING FOR RED FLAGS: Bowel or bladder incontinence: No Spinal tumors: No   COGNITION: Overall cognitive status: Within functional limits for tasks assessed     SENSATION: No numbness but sometimes tingling on top of feet and I sometimes have fasciculations  MUSCLE LENGTH: :Thomas test Right  WNL able to flatten deg; Left -32 from horizontal deg  Hamstrings: Right 74 deg; Left 69 deg  1/2 range of bridge only  POSTURE: rounded shoulders, forward head, and scoliosis with R elevated pelvis and scoliotic curve leaning to the right in sitting and standing with flexed and  rotated to Left posture  PALPATION: Pt with tightened Left QL, tightened bil hamstrings and  especially Left hip flexor.    LUMBAR ROM: P! - Pain severity  AROM eval  Flexion Finger tips to mid shins   Extension -15 extension P! Unable to come to neutral ext  Right lateral flexion Finger tip 1.5 inch above knee jt line P!  Left lateral flexion Finger tip to fibular head   Right rotation 50%  Left rotation 50% Stands in left rotation   (Blank rows = not tested)  LOWER EXTREMITY ROM:     Active  Right eval Left eval  Hip flexion Supine 120 Supine 100 unable to come to neutral  Hip extension    Hip abduction    Hip adduction    Hip internal rotation 22P!!! 12 P!!!  Hip external rotation 60 55  Knee flexion    Knee extension  Ankle dorsiflexion    Ankle plantarflexion    Ankle inversion    Ankle eversion     (Blank rows = not tested)  LOWER EXTREMITY MMT:    MMT Right eval Left eval  Hip flexion 4+ 4+  Hip extension 3-/ restricted  motion unable to come to neutral   Hip abduction 3+ 4-  Hip adduction    Hip internal rotation    Hip external rotation    Knee flexion 5 4+  Knee extension 5 5  Ankle dorsiflexion    Ankle plantarflexion    Ankle inversion     Ankle eversion     (Blank rows = not tested)   FUNCTIONAL TESTS:  5 times sit to stand: 17.95 sec  GAIT: Distance walked: 150 feet Assistive device utilized: None Level of assistance: Complete Independence Comments: Ambulates with flexed posture  with left and rotation to left posture.  Pt with left pelvic level higher than Right and concave scoliosis curve on Right  TODAY'S TREATMENT:  [facility] Adult PT Treatment:                                                DATE: 09-21-23 Therapeutic Exercise: Standing on 4 inch step in door way with Left LE hanging and trying to elongate spine on door frame swinging left LE Same position,  hip extension x 10 maximally (just past neutral for pt) Standing on 4 inch step in door way with right  LE hanging and trying to elongate spine on door frame swinging le right LE Same position,  hip extension x 10 maximally Using bil UE hiking sticks, simultaneously lunging on R and L 2 x 10 Bil UE hiking sticks with alternating trunk rotation with mini lunge Manual Therapy: STW to all TPDN muscles with decreased muscle tension Trigger Point Dry-Needling performed     by Garen Lah Treatment instructions: Expect mild to moderate muscle soreness. S/S of pneumothorax if dry needled over a lung field, and to seek immediate medical attention should they occur. Patient verbalized understanding of these instructions and education.  Patient Consent Given: Yes Education handout provided: Previously provided Muscles treated  left QL  thoracic left /right T -4 to T-8 , Left / right UT, Left infraspinatus  bil QL   R gluteals Electrical stimulation performed: No Parameters: N/A Treatment response/outcome: twitch response noted, pt noted relief  [facility] Adult PT Treatment:                                                DATE: 09/18/23 Therapeutic Exercise: Hooklying breathing 3 sets of 5 breaths with emphasis on L ribcage expansion with tactile cues, deep breathing for  muscular relaxation, education on pacing and normal breathing during rest breaks  7# unilat row CC RUE with rotation to R, CGA with staggered stance, cues for appropriate ROM/mechanics 3# punches CC LUE with rotation to R, cues for follow through and CGA given staggered stance L hip flexor isometric push 2x8 with UE support on wall  Standing L hip flexor stretch + contralateral trunk rotation x5; use of treadmill for LUE support, cueing for pelvic positioning and posture, head/neck positioning HEP update + education and provision of resistance bands, continued  education/discussion on relevant anatomy/physiology as it pertains to pain and rationale for interventions    [facility] Adult PT Treatment:                                                DATE: 09/07/23 Therapeutic Exercise: Banded scaption red band around wrists, 2x8; emphasis on posture, and consistent tension Standing triple flexion/extension LLE only, RUE support on counter 2x8 cues for breath control and posture CC R rotational row 10# x8 with LUE support (ski pole) heavy tactile cues to reduce compensations CC R rotational row 7# x8 with L UE support Continued education/discussion re: HEP and appropriate exercise   [facility] Adult PT Treatment:                                                DATE: 09/05/23 Therapeutic Exercise: Suitcase carry 15# 6x67ft cues for upright posture, RUE only  Seated high>low row CC 10# x15 BIL (performed unilaterally) Seated high>low row with blue band unilat, x8 BIL education for home setup Standing L hip flexor stretch + contralateral trunk rotation x10; use of treadmill for LUE support, cueing for pelvic positioning Standing L hip flexor stretch + ipsilateral UE reach x10; use of treadmill for RUE support, cueing for pelvic positioning Continuing to spend time with education/discussion re: relevant anatomy/physiology, exercise tolerance, rationale for interventions    [facility] Adult PT Treatment:                                                 DATE: 08/31/23 Therapeutic Exercise: LTR x10  Open book x10 R side only (L sidelying) L QL stretch w/ physioball fulcrum, 2x8 CGA given mild instability during lean, increased time for setup/performance, cues for appropriate ball height at hip Standing L hip flexor stretch + contralateral trunk rotation 2x10; use of treadmill for LUE support, cueing for pelvic positioning Significant time spent w/ education/discussion re: pt exercise outside of session, relevant anatomy/physiology as it pertains to exercise, and rationale for interventions    [facility] Adult PT Treatment:                                                DATE: 08-28-23 Therapeutic Exercise: Left hip flexor stretch 3 x 30 sec hold Supine LTR Standing on box and elongating Left side while holding onto door frame Manual Therapy: STW to all TPDN muscles with decreased muscle tension Trigger Point Dry-Needling performed     by Garen Lah Treatment instructions: Expect mild to moderate muscle soreness. S/S of pneumothorax if dry needled over a lung field, and to seek immediate medical attention should they occur. Patient verbalized understanding of these instructions and education.  Patient Consent Given: Yes Education handout provided: Previously provided Muscles treated  left QL  thoracic left T -4 to T-8 , Left UT, Left infraspinatus Electrical stimulation performed: No Parameters: N/A Treatment response/outcome: twitch response noted, pt noted relief  Self Care: Educaton on curve and  scoliosis and rotational curve                                                                                                                              DATE: 08-24-23  Eval    PATIENT EDUCATION:  Education details: rationale for interventions Person educated: Patient Education method: Explanation, Demonstration, Tactile cues, and Verbal cues Education comprehension: verbalized understanding, returned  demonstration, verbal cues required, and tactile cues required  HOME EXERCISE PROGRAM:  Access Code: [email or web address] URL: [email or web address] Date: 09/18/2023 Prepared by: Fransisco Hertz  Program Notes - with lat pull down, please keep elbows close to body, and do one arm at a time  Exercises - Seated Lat Pull Down with Resistance - Elbows Bent  - 2-3 x daily - 7 x weekly - 1 sets - 8 reps - Standing Single Arm Low Row with Anchored Resistance  - 2-3 x daily - 7 x weekly - 1 sets - 8 reps - Single Arm Punch with Resistance  - 2-3 x daily - 7 x weekly - 1 sets - 8 reps  ASSESSMENT:  CLINICAL IMPRESSION: 09/21/2023 Pt arrived w/ report of soreness from weekend activities especially on Right rather than left in previous session.   He is making progress with elongation of spine. Also did well with hip flexor dynamic stretching Pt consented to TPDN and was closely monitored throughout session with decreased pain at end of session and visibly more elongated spine but still with rotational scoliosis evident.  No adverse events, pt departs with report of muscular fatigue but no increase in overt pain. Recommend continuing along current POC in order to address relevant deficits and improve functional tolerance. Pt departs today's session in no acute distress, all voiced questions/concerns addressed appropriately from PT perspective.       EVAL- Patient is a 52 y.o. male who was seen today for physical therapy evaluation and treatment for spondylosis  and moderate scoliosis with flexed posture and scoliosis rotated to left with convex on Right. Including R sided sciatic pain.  Pt has has long history of back pain and lifetime dx of scoliosis.  He has thoracic.lumbar spasming into R buttock.  Pt with flexibility issues contributing to pain.He has a job In which he sits and works on [employer] also contributing to his over flexed posture. Pt would benefit form standing desk and HEP/helps to decrease  seated time. Pt will benefit from skilled PT to reduce pain and increase strength and flexibility to return to as neutral as possible. Pt needs to care for special needs son. He is limited due to pain in daily sedentary job and with sleep at night. He desires to return to a more active lifestyle playing racquet sports and golf as able but his sedentary job adds to his flexed and scoliotic posture. Will continue to maximize AROM and strength as able to add more varied leisure activities     OBJECTIVE IMPAIRMENTS: decreased activity tolerance, decreased  knowledge of condition, difficulty walking, decreased ROM, decreased strength, increased fascial restrictions, improper body mechanics, postural dysfunction, and obesity.   ACTIVITY LIMITATIONS: carrying, bending, sitting, standing, squatting, sleeping, stairs, locomotion level, and stairs  PARTICIPATION LIMITATIONS: driving, community activity, occupation, and caring for special needs son  PERSONAL FACTORS: Chronic pain syndrome, hyperlipidemia, HTN, OA DM Type 2, Pain managament. THR R 12 years ago May 2012.  Left shoulder hypermobility and chronic dislocation since basketball days. Surgery of 5th toe infection March 2003. Anxiety are also affecting patient's functional outcome.   REHAB POTENTIAL: Good  CLINICAL DECISION MAKING: Evolving/moderate complexity  EVALUATION COMPLEXITY: Moderate   GOALS: Goals reviewed with patient? Yes  SHORT TERM GOALS: Target date: 09-14-23  Pt will be independent with initial HEP Baseline:no knowledge Goal status: ONGOING  2.  Pt will work on sleep positions to increase more comfort in order to have at least 5 or more hours of restorative sleep Baseline:  tosses and turns at night and has increased pain Goal status:ONGOING  3.  Demonstrate understanding of  as neutral posture as possible with scoliosis and be more conscious of position and posture throughout the day.  Baseline: no knowledge Goal status:  ONGOING  4.  Pain with going up and down steps with maximum safety Baseline: Pt descends steps backwards due to size 16 shoe Goal status:ONGOING   LONG TERM GOALS: Target date: 10-05-23  Pt will be independent with advanced HEP using gym equipment Baseline:  Goal status: INITIAL  2.  Pt will be able to walk for  25 minutes using pain modulation Baseline: Can only walk for 5-8 minutes Goal status: INITIAL  3.  Pt will be able to pick up 25 # KB with proper form for exercise Baseline:  Goal status: INITIAL  4.  Pt will be able to improve flexibility in order to try to begin racquet sports /golf as able.  Baseline: unable to participate now Goal status: INITIAL  5.  Pain with activity will decrease  to 3/10 instead of 7/10 Baseline: At worse with functional activity 7/10 Goal status: INITIAL  6.  Pt will simulate golfing/ racquet sports and add strengthening/flexibility exercises that will enhance ability Baseline: Unable to  play sports at present due to spasming Goal status: INITIAL  PLAN:  PT FREQUENCY: 1-2x/week  PT DURATION: 6 weeks  PLANNED INTERVENTIONS: Therapeutic exercises, Therapeutic activity, Neuromuscular re-education, Balance training, Gait training, Patient/Family education, Self Care, Joint mobilization, Stair training, Dry Needling, Electrical stimulation, Spinal mobilization, Cryotherapy, Moist heat, Taping, Ionotophoresis 4mg /ml Dexamethasone, Manual therapy, and Re-evaluation.  PLAN FOR NEXT SESSION: review/update HEP PRN. Continue with multiplanar rotational activities, hip flexor mobility, working into postural extension. FOTO next session. Pt voices interest in continuing dry needling  Garen Lah, PT, [facility] Certified Exercise Expert for the Aging Adult  09/21/23 3:36 PM Phone: [phone] Fax: [phone]

## 2023-09-22 ENCOUNTER — Other Ambulatory Visit (HOSPITAL_COMMUNITY): Payer: Self-pay

## 2023-09-22 DIAGNOSIS — E291 Testicular hypofunction: Secondary | ICD-10-CM | POA: Diagnosis not present

## 2023-09-25 ENCOUNTER — Other Ambulatory Visit (HOSPITAL_COMMUNITY): Payer: Self-pay

## 2023-09-25 MED ORDER — BUPRENORPHINE HCL-NALOXONE HCL 8-2 MG SL FILM
1.0000 | ORAL_FILM | Freq: Two times a day (BID) | SUBLINGUAL | 0 refills | Status: DC
Start: 2023-09-25 — End: 2023-12-08
  Filled 2023-09-25: qty 60, 30d supply, fill #0

## 2023-09-26 ENCOUNTER — Ambulatory Visit: Payer: 59 | Admitting: Physical Therapy

## 2023-09-26 ENCOUNTER — Encounter: Payer: Self-pay | Admitting: Physical Therapy

## 2023-09-26 DIAGNOSIS — R2689 Other abnormalities of gait and mobility: Secondary | ICD-10-CM

## 2023-09-26 DIAGNOSIS — M5441 Lumbago with sciatica, right side: Secondary | ICD-10-CM | POA: Diagnosis not present

## 2023-09-26 DIAGNOSIS — G8929 Other chronic pain: Secondary | ICD-10-CM

## 2023-09-26 DIAGNOSIS — Z79899 Other long term (current) drug therapy: Secondary | ICD-10-CM | POA: Diagnosis not present

## 2023-09-26 DIAGNOSIS — R293 Abnormal posture: Secondary | ICD-10-CM | POA: Diagnosis not present

## 2023-09-26 NOTE — Therapy (Signed)
OUTPATIENT PHYSICAL THERAPY TREATMENT NOTE   Patient Name: Brandon Gordon MRN: [id number] DOB:[birth year], 52 y.o., male Today's Date: 09/26/2023  END OF SESSION:         Past Medical History:  Diagnosis Date   Anxiety    At risk for sleep apnea    STOP-BANG= 5      SENT TO PCP 04-05-2016   Chronic pain syndrome    History of kidney stones    Hyperlipidemia    Hypertension    Left ureteral stone    OA (osteoarthritis)    back, hip   Pain management    Type 2 diabetes mellitus (HCC)    Past Surgical History:  Procedure Laterality Date   TOTAL HIP ARTHROPLASTY Right 04-22-2011   There are no problems to display for this patient.   PCP: Noberto Retort, MD   REFERRING PROVIDER: Bedelia Person, MD   REFERRING DIAG: [phone] (ICD-10-CM) - Spondylosis without myelopathy or radiculopathy, lumbar region   Rationale for Evaluation and Treatment: Rehabilitation  THERAPY DIAG:  No diagnosis found.  ONSET DATE: 20 years of back pain/ scoliosis,  recently pain in back and continuing to bend back  SUBJECTIVE:                                                                                                                                                                                           SUBJECTIVE STATEMENT: 09/26/2023 Pt states he was feeling better with the needling.What can I do when I get a spasm down the  sciatica when I am bending over to decrease the spasm. I am a 3/10 pain for me today. I go down the stairs backwards so I can make sure I use the railing.    EVAL-  My wife is [name] PT at [employer]  I am now in a job in which I have to bend over a desk.  I am 6'6"   I played basketball [address], [address] and [employer] in college.  Pain in back  is overwhelming. I have a special needs son. I have been having pain that really effects my life. I have severe scoliosis and a hx of compressed L4 L5 . I feel like the hunchback of [facility].  I also swim at [facility]  and the  [facility] in winter.  I have been in a new job with about 8 hours of sitting.   I do not have a standing desk. I work 40% in office and 60% at home.  I have had dry needling and it was helpful and water. 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  I use 20lb  DB to do bench press etc.   Pt with long history of back pain and lifelong dx of scoliosis  PERTINENT HISTORY:  Chronic pain syndrome, hyperlipidemia, HTN, OA DM Type 2, Pain managament. THR R 12 years ago May 2012.  Left shoulder hypermobility and chronic dislocation since basketball days. Surgery of 5th toe infection March 2003. Anxiety  PAIN:  Are you having pain? Yes: NPRS scale: at rest 3/10 and  7/10 Pain location: low back radiating into R buttock and thoracic pain at scoliotic curve Pain description: sharp constant and spasming Aggravating factors: sitting for longer than 30 min, walking for longer  than 5-8 min and begin spasm Relieving factors: Rest Household chores and carrying laundry, any walking  and unable to do any gold swing  PRECAUTIONS: None  RED FLAGS: Bowel or bladder incontinence: No and Spinal tumors: No   WEIGHT BEARING RESTRICTIONS: No  FALLS:  Has patient fallen in last 6 months? 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  PLOF: Independent  PATIENT GOALS: To be able to walk again and try to play golf , try to strengthen safely  NEXT MD VISIT: TBD  OBJECTIVE: (objective measures completed at initial evaluation unless otherwise dated)   DIAGNOSTIC FINDINGS:  Recently taken at [facility]  PATIENT SURVEYS:  FOTO 47% Predicted 56%  SCREENING FOR RED FLAGS: Bowel or bladder incontinence: No Spinal tumors:  No   COGNITION: Overall cognitive status: Within functional limits for tasks assessed     SENSATION: No numbness but sometimes tingling on top of feet and I sometimes have fasciculations  MUSCLE LENGTH: :Thomas test Right  WNL able to flatten deg; Left -32 from horizontal deg  Hamstrings: Right 74 deg; Left 69 deg  1/2 range of bridge only  POSTURE: rounded shoulders, forward head, and scoliosis with R elevated pelvis and scoliotic curve leaning to the right in sitting and standing with flexed and  rotated to Left posture  PALPATION: Pt with tightened Left QL, tightened bil hamstrings and  especially Left hip flexor.    LUMBAR ROM: P! - Pain severity  AROM eval  Flexion Finger tips to mid shins   Extension -15 extension P! Unable to come to neutral ext  Right lateral flexion Finger tip 1.5 inch above knee jt line P!  Left lateral flexion Finger tip to fibular head   Right rotation 50%  Left rotation 50% Stands in left rotation   (Blank rows = not tested)  LOWER EXTREMITY ROM:     Active  Right eval Left eval  Hip flexion Supine 120 Supine 100 unable to come to neutral  Hip extension    Hip abduction    Hip adduction    Hip internal rotation 22P!!! 12 P!!!  Hip external rotation 60 55  Knee flexion    Knee extension    Ankle dorsiflexion    Ankle plantarflexion    Ankle inversion    Ankle eversion     (Blank rows = not tested)  LOWER EXTREMITY MMT:    MMT Right eval Left eval  Hip flexion 4+ 4+  Hip extension 3-/ restricted  motion unable to come to neutral  Hip abduction 3+ 4-  Hip adduction    Hip internal rotation    Hip external rotation    Knee flexion 5 4+  Knee extension 5 5  Ankle dorsiflexion    Ankle plantarflexion    Ankle inversion    Ankle eversion     (Blank rows = not tested)   FUNCTIONAL TESTS:  5 times sit to stand: 17.95 sec  GAIT: Distance walked: 150 feet Assistive device utilized: None Level of assistance: Complete  Independence Comments: Ambulates with flexed posture  with left and rotation to left posture.  Pt with left pelvic level higher than Right and concave scoliosis curve on Right  TODAY'S TREATMENT:  [facility] Adult PT Treatment:                                                DATE: 09-28-23 Therapeutic Exercise: *** Manual Therapy: *** Neuromuscular re-ed: *** Therapeutic Activity: *** Modalities: *** Self Care: ***  [name] Adult PT Treatment:                                                DATE: 09-26-23 Therapeutic Exercise: Physio ball  green bridging and decompression of spine x 15 Physio ball forward lean and to the Right for derotation with deep breaths Deadlift fo 55 # with lift bar and elevated on 18 inch boxes.  7 x  with PT CGA x 1  Manual Therapy: STW to all TPDN muscles with decreased muscle tension Trigger Point Dry-Needling performed     by Garen Lah Treatment instructions: Expect mild to moderate muscle soreness. S/S of pneumothorax if dry needled over a lung field, and to seek immediate medical attention should they occur. Patient verbalized understanding of these instructions and education.  Patient Consent Given: Yes Education handout provided: Previously provided Muscles treated  R QL  thoracic left /right T -4 to T-8   R gluteals, R piriformis Electrical stimulation performed: No Parameters: N/A Treatment response/outcome: twitch response noted, pt noted relief  Therapeutic Activity: Problems solving decreasing pain with left side and forward bending and developing sciatica.   Pt simulating problem positions and PT offering derotation suggestions to lengthen spine and utilize sciatic nerve gliding  Self Care: Discussed online accountability and scoliosis specific group exercise and resources Use of skeleton for scoliosis education   [facility] Adult PT Treatment:                                                DATE: 09-21-23 Therapeutic Exercise: Standing on 4 inch  step in door way with Left LE hanging and trying to elongate spine on door frame swinging left LE Same position,  hip extension x 10 maximally (just past neutral for pt) Standing on 4 inch step in door way with right  LE hanging and trying to elongate spine on door frame swinging le right LE Same position,  hip extension x 10 maximally Using bil UE hiking sticks, simultaneously lunging on R and L 2 x 10 Bil UE hiking sticks with alternating trunk rotation with mini lunge Manual Therapy: STW  to all TPDN muscles with decreased muscle tension Trigger Point Dry-Needling performed     by Garen Lah Treatment instructions: Expect mild to moderate muscle soreness. S/S of pneumothorax if dry needled over a lung field, and to seek immediate medical attention should they occur. Patient verbalized understanding of these instructions and education.  Patient Consent Given: Yes Education handout provided: Previously provided Muscles treated  left QL  thoracic left /right T -4 to T-8 , Left / right UT, Left infraspinatus  bil QL   R gluteals Electrical stimulation performed: No Parameters: N/A Treatment response/outcome: twitch response noted, pt noted relief  [facility] Adult PT Treatment:                                                DATE: 09/18/23 Therapeutic Exercise: Hooklying breathing 3 sets of 5 breaths with emphasis on L ribcage expansion with tactile cues, deep breathing for muscular relaxation, education on pacing and normal breathing during rest breaks  7# unilat row CC RUE with rotation to R, CGA with staggered stance, cues for appropriate ROM/mechanics 3# punches CC LUE with rotation to R, cues for follow through and CGA given staggered stance L hip flexor isometric push 2x8 with UE support on wall  Standing L hip flexor stretch + contralateral trunk rotation x5; use of treadmill for LUE support, cueing for pelvic positioning and posture, head/neck positioning HEP update + education and  provision of resistance bands, continued education/discussion on relevant anatomy/physiology as it pertains to pain and rationale for interventions    [facility] Adult PT Treatment:                                                DATE: 09/07/23 Therapeutic Exercise: Banded scaption red band around wrists, 2x8; emphasis on posture, and consistent tension Standing triple flexion/extension LLE only, RUE support on counter 2x8 cues for breath control and posture CC R rotational row 10# x8 with LUE support (ski pole) heavy tactile cues to reduce compensations CC R rotational row 7# x8 with L UE support Continued education/discussion re: HEP and appropriate exercise   [facility] Adult PT Treatment:                                                DATE: 09/05/23 Therapeutic Exercise: Suitcase carry 15# 6x78ft cues for upright posture, RUE only  Seated high>low row CC 10# x15 BIL (performed unilaterally) Seated high>low row with blue band unilat, x8 BIL education for home setup Standing L hip flexor stretch + contralateral trunk rotation x10; use of treadmill for LUE support, cueing for pelvic positioning Standing L hip flexor stretch + ipsilateral UE reach x10; use of treadmill for RUE support, cueing for pelvic positioning Continuing to spend time with education/discussion re: relevant anatomy/physiology, exercise tolerance, rationale for interventions    [facility] Adult PT Treatment:  DATE: 08/31/23 Therapeutic Exercise: LTR x10  Open book x10 R side only (L sidelying) L QL stretch w/ physioball fulcrum, 2x8 CGA given mild instability during lean, increased time for setup/performance, cues for appropriate ball height at hip Standing L hip flexor stretch + contralateral trunk rotation 2x10; use of treadmill for LUE support, cueing for pelvic positioning Significant time spent w/ education/discussion re: pt exercise outside of session, relevant anatomy/physiology as it  pertains to exercise, and rationale for interventions    [facility] Adult PT Treatment:                                                DATE: 08-28-23 Therapeutic Exercise: Left hip flexor stretch 3 x 30 sec hold Supine LTR Standing on box and elongating Left side while holding onto door frame Manual Therapy: STW to all TPDN muscles with decreased muscle tension Trigger Point Dry-Needling performed     by Garen Lah Treatment instructions: Expect mild to moderate muscle soreness. S/S of pneumothorax if dry needled over a lung field, and to seek immediate medical attention should they occur. Patient verbalized understanding of these instructions and education.  Patient Consent Given: Yes Education handout provided: Previously provided Muscles treated  left QL  thoracic left T -4 to T-8 , Left UT, Left infraspinatus Electrical stimulation performed: No Parameters: N/A Treatment response/outcome: twitch response noted, pt noted relief  Self Care: Educaton on curve and scoliosis and rotational curve                                                                                                                              DATE: 08-24-23  Eval    PATIENT EDUCATION:  Education details: rationale for interventions Person educated: Patient Education method: Explanation, Demonstration, Tactile cues, and Verbal cues Education comprehension: verbalized understanding, returned demonstration, verbal cues required, and tactile cues required  HOME EXERCISE PROGRAM:  Access Code: [email or web address] URL: [email or web address] Date: 09/18/2023 Prepared by: Fransisco Hertz  Program Notes - with lat pull down, please keep elbows close to body, and do one arm at a time  Exercises - Seated Lat Pull Down with Resistance - Elbows Bent  - 2-3 x daily - 7 x weekly - 1 sets - 8 reps - Standing Single Arm Low Row with Anchored Resistance  - 2-3 x daily - 7 x weekly - 1 sets - 8 reps - Single Arm Punch  with Resistance  - 2-3 x daily - 7 x weekly - 1 sets - 8 reps Added to HEP 09-26-23 Program Notes - with lat pull down, please keep elbows close to body, and do one arm at a timePhysio ball forward and to the right push forward. - [place] Deadlift With Barbell  - 1 x daily - 7 x weekly - 3 sets - 10 reps -  Supine Bridge with Heels on Swiss Ball and Knees Bent  - 1 x daily - 7 x weekly - 2 sets - 10 reps ASSESSMENT:  CLINICAL IMPRESSION: 09/26/2023 Pt arrived w/ report of soreness and 3/10 pain and complaint of sciatic spasms in R low back when performing household duties.  Session concentrated on education on Hip hinge and dead lift education and return demo  for proper execution of technique.   All STG met except for # 4.    He is making progress with elongation of spine and trying to be more active and mindful of derotation. Also educated on scoliosis using model skeleton and given information for scoliosis group strengthening to use upon DC.  Pt consents to TPDN and is closely monitored throughout session with no adverse effects. And decreased muscle tension..  No adverse events, pt departs with report of muscular fatigue but no increase in overt pain.  Pt departs today's session in no acute distress, all voiced questions/concerns addressed appropriately from PT perspective.       EVAL- Patient is a 52 y.o. male who was seen today for physical therapy evaluation and treatment for spondylosis  and moderate scoliosis with flexed posture and scoliosis rotated to left with convex on Right. Including R sided sciatic pain.  Pt has has long history of back pain and lifetime dx of scoliosis.  He has thoracic.lumbar spasming into R buttock.  Pt with flexibility issues contributing to pain.He has a job In which he sits and works on [employer] also contributing to his over flexed posture. Pt would benefit form standing desk and HEP/helps to decrease seated time. Pt will benefit from skilled PT to reduce pain and  increase strength and flexibility to return to as neutral as possible. Pt needs to care for special needs son. He is limited due to pain in daily sedentary job and with sleep at night. He desires to return to a more active lifestyle playing racquet sports and golf as able but his sedentary job adds to his flexed and scoliotic posture. Will continue to maximize AROM and strength as able to add more varied leisure activities     OBJECTIVE IMPAIRMENTS: decreased activity tolerance, decreased knowledge of condition, difficulty walking, decreased ROM, decreased strength, increased fascial restrictions, improper body mechanics, postural dysfunction, and obesity.   ACTIVITY LIMITATIONS: carrying, bending, sitting, standing, squatting, sleeping, stairs, locomotion level, and stairs  PARTICIPATION LIMITATIONS: driving, community activity, occupation, and caring for special needs son  PERSONAL FACTORS: Chronic pain syndrome, hyperlipidemia, HTN, OA DM Type 2, Pain managament. THR R 12 years ago May 2012.  Left shoulder hypermobility and chronic dislocation since basketball days. Surgery of 5th toe infection March 2003. Anxiety are also affecting patient's functional outcome.   REHAB POTENTIAL: Good  CLINICAL DECISION MAKING: Evolving/moderate complexity  EVALUATION COMPLEXITY: Moderate   GOALS: Goals reviewed with patient? Yes  SHORT TERM GOALS: Target date: 09-14-23  Pt will be independent with initial HEP Baseline:no knowledge Goal status:MET  2.  Pt will work on sleep positions to increase more comfort in order to have at least 5 or more hours of restorative sleep Baseline:  tosses and turns at night and has increased pain Goal status:MET  3.  Demonstrate understanding of  as neutral posture as possible with scoliosis and be more conscious of position and posture throughout the day.  Baseline: no knowledge Goal status: MET  4.  Pain with going up and down steps with maximum  safety Baseline: Pt descends steps  backwards due to size 16 shoe Goal status:ONGOING   LONG TERM GOALS: Target date: 10-05-23  Pt will be independent with advanced HEP using gym equipment Baseline:  Goal status:ONGOING  2.  Pt will be able to walk for  25 minutes using pain modulation Baseline: Can only walk for 5-8 minutes Goal status: ONGOING  3.  Pt will be able to pick up 25 # KB with proper form for exercise Baseline:  09-26-23  working on 55 # lift bar Goal status: ONGOING  4.  Pt will be able to improve flexibility in order to try to begin racquet sports /golf as able.  Baseline: unable to participate now Goal status:ONGOING  5.  Pain with activity will decrease  to 3/10 instead of 7/10 Baseline: At worse with functional activity 7/10 Goal status: ONGOING  6.  Pt will simulate golfing/ racquet sports and add strengthening/flexibility exercises that will enhance ability Baseline: Unable to  play sports at present due to spasming Goal status: ONGOING  PLAN:  PT FREQUENCY: 1-2x/week  PT DURATION: 6 weeks  PLANNED INTERVENTIONS: Therapeutic exercises, Therapeutic activity, Neuromuscular re-education, Balance training, Gait training, Patient/Family education, Self Care, Joint mobilization, Stair training, Dry Needling, Electrical stimulation, Spinal mobilization, Cryotherapy, Moist heat, Taping, Ionotophoresis 4mg /ml Dexamethasone, Manual therapy, and Re-evaluation.  PLAN FOR NEXT SESSION: review/update HEP PRN. Continue with multiplanar rotational activities, hip flexor mobility, working into postural extension. FOTO next session. Pt voices interest in continuing dry needling  ***

## 2023-09-26 NOTE — Therapy (Signed)
OUTPATIENT PHYSICAL THERAPY TREATMENT NOTE   Patient Name: Brandon Gordon MRN: [id number] DOB:[birth year], 52 y.o., male Today's Date: 09/26/2023  END OF SESSION:  PT End of Session - 09/26/23 1334     Visit Number 8    Number of Visits 13    Date for PT Re-Evaluation 10/05/23    Authorization Type [place] [employer]    PT Start Time [phone]    PT Stop Time 1415    PT Time Calculation (min) 42 min    Activity Tolerance Patient tolerated treatment well    Behavior During Therapy WFL for tasks assessed/performed                  Past Medical History:  Diagnosis Date   Anxiety    At risk for sleep apnea    STOP-BANG= 5      SENT TO PCP 04-05-2016   Chronic pain syndrome    History of kidney stones    Hyperlipidemia    Hypertension    Left ureteral stone    OA (osteoarthritis)    back, hip   Pain management    Type 2 diabetes mellitus (HCC)    Past Surgical History:  Procedure Laterality Date   TOTAL HIP ARTHROPLASTY Right 04-22-2011   There are no problems to display for this patient.   PCP: Noberto Retort, MD   REFERRING PROVIDER: Bedelia Person, MD   REFERRING DIAG: [id number] (ICD-10-CM) - Spondylosis without myelopathy or radiculopathy, lumbar region   Rationale for Evaluation and Treatment: Rehabilitation  THERAPY DIAG:  Chronic bilateral low back pain with right-sided sciatica  Abnormal posture  Other abnormalities of gait and mobility  ONSET DATE: 20 years of back pain/ scoliosis,  recently pain in back and continuing to bend back  SUBJECTIVE:                                                                                                                                                                                           SUBJECTIVE STATEMENT: 09/26/2023 Pt states he was feeling better with the needling.What can I do when I get a spasm down the  sciatica when I am bending over to decrease the spasm. I am a 3/10 pain for me today. I go  down the stairs backwards so I can make sure I use the railing.    EVAL-  My wife is [name] PT at [employer]  I am now in a job in which I have to bend over a desk.  I am 6'6"   I played basketball [address], [address] and [employer] in college.  Pain in  back  is overwhelming. I have a special needs son. I have been having pain that really effects my life. I have severe scoliosis and a hx of compressed L4 L5 . I feel like the hunchback of [facility].  I also swim at [facility]  and the [facility] in winter.  I have been in a new job with about 8 hours of sitting.   I do not have a standing desk. I work 40% in office and 60% at home.  I have had dry needling and it was helpful and water. 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  I use 20lb  DB to do bench press etc.   Pt with long history of back pain and lifelong dx of scoliosis  PERTINENT HISTORY:  Chronic pain syndrome, hyperlipidemia, HTN, OA DM Type 2, Pain managament. THR R 12 years ago May 2012.  Left shoulder hypermobility and chronic dislocation since basketball days. Surgery of 5th toe infection March 2003. Anxiety  PAIN:  Are you having pain? Yes: NPRS scale: at rest 3/10 and  7/10 Pain location: low back radiating into R buttock and thoracic pain at scoliotic curve Pain description: sharp constant and spasming Aggravating factors: sitting for longer than 30 min, walking for longer  than 5-8 min and begin spasm Relieving factors: Rest Household chores and carrying laundry, any walking  and unable to do any gold swing  PRECAUTIONS: None  RED FLAGS: Bowel or bladder incontinence: No and Spinal tumors: No   WEIGHT BEARING RESTRICTIONS: No  FALLS:  Has patient fallen in last 6 months? 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  PLOF: Independent  PATIENT GOALS: To be able to walk again and try to play golf , try to strengthen safely  NEXT MD VISIT: TBD  OBJECTIVE: (objective measures completed at initial evaluation unless otherwise dated)   DIAGNOSTIC FINDINGS:  Recently taken at [facility] medical  PATIENT SURVEYS:  FOTO 47% Predicted 56%  SCREENING FOR RED FLAGS: Bowel or bladder incontinence: No Spinal tumors: No   COGNITION: Overall cognitive status: Within functional limits for tasks assessed     SENSATION: No numbness but sometimes tingling on top of feet and I sometimes have fasciculations  MUSCLE LENGTH: :Thomas test Right  WNL able to flatten deg; Left -32 from horizontal deg  Hamstrings: Right 74 deg; Left 69 deg  1/2 range of bridge only  POSTURE: rounded shoulders, forward head, and scoliosis with R elevated pelvis and scoliotic curve leaning to the right in sitting and standing with flexed and  rotated to Left posture  PALPATION: Pt with tightened Left QL, tightened bil hamstrings and  especially Left hip flexor.    LUMBAR ROM: P! - Pain severity  AROM eval  Flexion Finger tips to mid shins   Extension -15 extension P! Unable to come to neutral ext  Right lateral flexion Finger tip 1.5 inch above knee jt line P!  Left lateral flexion Finger tip to fibular head   Right rotation 50%  Left rotation 50% Stands in left rotation   (Blank rows = not tested)  LOWER EXTREMITY ROM:     Active  Right eval Left eval  Hip flexion Supine 120 Supine 100 unable to come to neutral  Hip extension    Hip abduction    Hip adduction    Hip internal rotation 22P!!! 12 P!!!  Hip external rotation 60 55  Knee flexion    Knee extension    Ankle dorsiflexion    Ankle plantarflexion    Ankle inversion    Ankle eversion     (Blank rows = not tested)  LOWER EXTREMITY MMT:    MMT Right eval Left eval  Hip flexion 4+ 4+  Hip  extension 3-/ restricted  motion unable to come to neutral   Hip abduction 3+ 4-  Hip adduction    Hip internal rotation    Hip external rotation    Knee flexion 5 4+  Knee extension 5 5  Ankle dorsiflexion    Ankle plantarflexion    Ankle inversion    Ankle eversion     (Blank rows = not tested)   FUNCTIONAL TESTS:  5 times sit to stand: 17.95 sec  GAIT: Distance walked: 150 feet Assistive device utilized: None Level of assistance: Complete Independence Comments: Ambulates with flexed posture  with left and rotation to left posture.  Pt with left pelvic level higher than Right and concave scoliosis curve on Right  TODAY'S TREATMENT:  [facility] Adult PT Treatment:                                                DATE: 09-26-23 Therapeutic Exercise: Physio ball  green bridging and decompression of spine x 15 Physio ball forward lean and to the Right for derotation with deep breaths Deadlift fo 55 # with lift bar and elevated on 18 inch boxes.  7 x  with PT CGA x 1  Manual Therapy: STW to all TPDN muscles with decreased muscle tension Trigger Point Dry-Needling performed     by Garen Lah Treatment instructions: Expect mild to moderate muscle soreness. S/S of pneumothorax if dry needled over a lung field, and to seek immediate medical attention should they occur. Patient verbalized understanding of these instructions and education.  Patient Consent Given: Yes Education handout provided: Previously provided Muscles treated  R QL  thoracic left /right T -4 to T-8   R gluteals, R piriformis Electrical stimulation performed: No Parameters: N/A Treatment response/outcome: twitch response noted, pt noted relief  Therapeutic Activity: Problems solving decreasing pain with left side and forward bending and developing sciatica.   Pt simulating problem positions and PT offering derotation suggestions to lengthen spine and utilize sciatic nerve gliding  Self Care: Discussed online  accountability and scoliosis specific group exercise and resources Use of skeleton for scoliosis education   [facility] Adult PT Treatment:                                                DATE: 09-21-23 Therapeutic Exercise: Standing on 4 inch step in door way with Left LE hanging and trying to elongate spine on door frame swinging left LE Same position,  hip extension x 10 maximally (just past neutral for pt) Standing on 4 inch step in door way with right  LE hanging and trying to elongate spine on door frame swinging le right LE  Same position,  hip extension x 10 maximally Using bil UE hiking sticks, simultaneously lunging on R and L 2 x 10 Bil UE hiking sticks with alternating trunk rotation with mini lunge Manual Therapy: STW to all TPDN muscles with decreased muscle tension Trigger Point Dry-Needling performed     by Garen Lah Treatment instructions: Expect mild to moderate muscle soreness. S/S of pneumothorax if dry needled over a lung field, and to seek immediate medical attention should they occur. Patient verbalized understanding of these instructions and education.  Patient Consent Given: Yes Education handout provided: Previously provided Muscles treated  left QL  thoracic left /right T -4 to T-8 , Left / right UT, Left infraspinatus  bil QL   R gluteals Electrical stimulation performed: No Parameters: N/A Treatment response/outcome: twitch response noted, pt noted relief  [facility] Adult PT Treatment:                                                DATE: 09/18/23 Therapeutic Exercise: Hooklying breathing 3 sets of 5 breaths with emphasis on L ribcage expansion with tactile cues, deep breathing for muscular relaxation, education on pacing and normal breathing during rest breaks  7# unilat row CC RUE with rotation to R, CGA with staggered stance, cues for appropriate ROM/mechanics 3# punches CC LUE with rotation to R, cues for follow through and CGA given staggered stance L hip flexor  isometric push 2x8 with UE support on wall  Standing L hip flexor stretch + contralateral trunk rotation x5; use of treadmill for LUE support, cueing for pelvic positioning and posture, head/neck positioning HEP update + education and provision of resistance bands, continued education/discussion on relevant anatomy/physiology as it pertains to pain and rationale for interventions    [facility] Adult PT Treatment:                                                DATE: 09/07/23 Therapeutic Exercise: Banded scaption red band around wrists, 2x8; emphasis on posture, and consistent tension Standing triple flexion/extension LLE only, RUE support on counter 2x8 cues for breath control and posture CC R rotational row 10# x8 with LUE support (ski pole) heavy tactile cues to reduce compensations CC R rotational row 7# x8 with L UE support Continued education/discussion re: HEP and appropriate exercise   [facility] Adult PT Treatment:                                                DATE: 09/05/23 Therapeutic Exercise: Suitcase carry 15# 6x70ft cues for upright posture, RUE only  Seated high>low row CC 10# x15 BIL (performed unilaterally) Seated high>low row with blue band unilat, x8 BIL education for home setup Standing L hip flexor stretch + contralateral trunk rotation x10; use of treadmill for LUE support, cueing for pelvic positioning Standing L hip flexor stretch + ipsilateral UE reach x10; use of treadmill for RUE support, cueing for pelvic positioning Continuing to spend time with education/discussion re: relevant anatomy/physiology, exercise tolerance, rationale for interventions    [facility] Adult PT Treatment:  DATE: 08/31/23 Therapeutic Exercise: LTR x10  Open book x10 R side only (L sidelying) L QL stretch w/ physioball fulcrum, 2x8 CGA given mild instability during lean, increased time for setup/performance, cues for appropriate ball height at hip Standing L  hip flexor stretch + contralateral trunk rotation 2x10; use of treadmill for LUE support, cueing for pelvic positioning Significant time spent w/ education/discussion re: pt exercise outside of session, relevant anatomy/physiology as it pertains to exercise, and rationale for interventions    [facility] Adult PT Treatment:                                                DATE: 08-28-23 Therapeutic Exercise: Left hip flexor stretch 3 x 30 sec hold Supine LTR Standing on box and elongating Left side while holding onto door frame Manual Therapy: STW to all TPDN muscles with decreased muscle tension Trigger Point Dry-Needling performed     by Garen Lah Treatment instructions: Expect mild to moderate muscle soreness. S/S of pneumothorax if dry needled over a lung field, and to seek immediate medical attention should they occur. Patient verbalized understanding of these instructions and education.  Patient Consent Given: Yes Education handout provided: Previously provided Muscles treated  left QL  thoracic left T -4 to T-8 , Left UT, Left infraspinatus Electrical stimulation performed: No Parameters: N/A Treatment response/outcome: twitch response noted, pt noted relief  Self Care: Educaton on curve and scoliosis and rotational curve                                                                                                                              DATE: 08-24-23  Eval    PATIENT EDUCATION:  Education details: rationale for interventions Person educated: Patient Education method: Explanation, Demonstration, Tactile cues, and Verbal cues Education comprehension: verbalized understanding, returned demonstration, verbal cues required, and tactile cues required  HOME EXERCISE PROGRAM:  Access Code: [email or web address] URL: [email or web address] Date: 09/18/2023 Prepared by: Fransisco Hertz  Program Notes - with lat pull down, please keep elbows close to body, and do one arm at a  time  Exercises - Seated Lat Pull Down with Resistance - Elbows Bent  - 2-3 x daily - 7 x weekly - 1 sets - 8 reps - Standing Single Arm Low Row with Anchored Resistance  - 2-3 x daily - 7 x weekly - 1 sets - 8 reps - Single Arm Punch with Resistance  - 2-3 x daily - 7 x weekly - 1 sets - 8 reps Added to HEP 09-26-23 Program Notes - with lat pull down, please keep elbows close to body, and do one arm at a timePhysio ball forward and to the right push forward. - [place] Deadlift With Barbell  - 1 x daily - 7 x weekly - 3 sets - 10 reps -  Supine Bridge with Heels on Swiss Ball and Knees Bent  - 1 x daily - 7 x weekly - 2 sets - 10 reps ASSESSMENT:  CLINICAL IMPRESSION: 09/26/2023 Pt arrived w/ report of soreness and 3/10 pain and complaint of sciatic spasms in R low back when performing household duties.  Session concentrated on education on Hip hinge and dead lift education and return demo  for proper execution of technique.   All STG met except for # 4.    He is making progress with elongation of spine and trying to be more active and mindful of derotation. Also educated on scoliosis using model skeleton and given information for scoliosis group strengthening to use upon DC.  Pt consents to TPDN and is closely monitored throughout session with no adverse effects. And decreased muscle tension..  No adverse events, pt departs with report of muscular fatigue but no increase in overt pain.  Pt departs today's session in no acute distress, all voiced questions/concerns addressed appropriately from PT perspective.       EVAL- Patient is a 52 y.o. male who was seen today for physical therapy evaluation and treatment for spondylosis  and moderate scoliosis with flexed posture and scoliosis rotated to left with convex on Right. Including R sided sciatic pain.  Pt has has long history of back pain and lifetime dx of scoliosis.  He has thoracic.lumbar spasming into R buttock.  Pt with flexibility issues  contributing to pain.He has a job In which he sits and works on [employer] also contributing to his over flexed posture. Pt would benefit form standing desk and HEP/helps to decrease seated time. Pt will benefit from skilled PT to reduce pain and increase strength and flexibility to return to as neutral as possible. Pt needs to care for special needs son. He is limited due to pain in daily sedentary job and with sleep at night. He desires to return to a more active lifestyle playing racquet sports and golf as able but his sedentary job adds to his flexed and scoliotic posture. Will continue to maximize AROM and strength as able to add more varied leisure activities     OBJECTIVE IMPAIRMENTS: decreased activity tolerance, decreased knowledge of condition, difficulty walking, decreased ROM, decreased strength, increased fascial restrictions, improper body mechanics, postural dysfunction, and obesity.   ACTIVITY LIMITATIONS: carrying, bending, sitting, standing, squatting, sleeping, stairs, locomotion level, and stairs  PARTICIPATION LIMITATIONS: driving, community activity, occupation, and caring for special needs son  PERSONAL FACTORS: Chronic pain syndrome, hyperlipidemia, HTN, OA DM Type 2, Pain managament. THR R 12 years ago May 2012.  Left shoulder hypermobility and chronic dislocation since basketball days. Surgery of 5th toe infection March 2003. Anxiety are also affecting patient's functional outcome.   REHAB POTENTIAL: Good  CLINICAL DECISION MAKING: Evolving/moderate complexity  EVALUATION COMPLEXITY: Moderate   GOALS: Goals reviewed with patient? Yes  SHORT TERM GOALS: Target date: 09-14-23  Pt will be independent with initial HEP Baseline:no knowledge Goal status:MET  2.  Pt will work on sleep positions to increase more comfort in order to have at least 5 or more hours of restorative sleep Baseline:  tosses and turns at night and has increased pain Goal status:MET  3.   Demonstrate understanding of  as neutral posture as possible with scoliosis and be more conscious of position and posture throughout the day.  Baseline: no knowledge Goal status: MET  4.  Pain with going up and down steps with maximum safety Baseline: Pt descends steps  backwards due to size 16 shoe Goal status:ONGOING   LONG TERM GOALS: Target date: 10-05-23  Pt will be independent with advanced HEP using gym equipment Baseline:  Goal status:ONGOING  2.  Pt will be able to walk for  25 minutes using pain modulation Baseline: Can only walk for 5-8 minutes Goal status: ONGOING  3.  Pt will be able to pick up 25 # KB with proper form for exercise Baseline:  09-26-23  working on 55 # lift bar Goal status: ONGOING  4.  Pt will be able to improve flexibility in order to try to begin racquet sports /golf as able.  Baseline: unable to participate now Goal status:ONGOING  5.  Pain with activity will decrease  to 3/10 instead of 7/10 Baseline: At worse with functional activity 7/10 Goal status: ONGOING  6.  Pt will simulate golfing/ racquet sports and add strengthening/flexibility exercises that will enhance ability Baseline: Unable to  play sports at present due to spasming Goal status: ONGOING  PLAN:  PT FREQUENCY: 1-2x/week  PT DURATION: 6 weeks  PLANNED INTERVENTIONS: Therapeutic exercises, Therapeutic activity, Neuromuscular re-education, Balance training, Gait training, Patient/Family education, Self Care, Joint mobilization, Stair training, Dry Needling, Electrical stimulation, Spinal mobilization, Cryotherapy, Moist heat, Taping, Ionotophoresis 4mg /ml Dexamethasone, Manual therapy, and Re-evaluation.  PLAN FOR NEXT SESSION: review/update HEP PRN. Continue with multiplanar rotational activities, hip flexor mobility, working into postural extension. FOTO next session. Pt voices interest in continuing dry needling  Garen Lah, PT, [facility] Certified Exercise Expert for the  Aging Adult  09/26/23 2:34 PM Phone: [phone] Fax: [phone]

## 2023-09-28 ENCOUNTER — Ambulatory Visit: Payer: 59 | Admitting: Physical Therapy

## 2023-09-28 ENCOUNTER — Encounter: Payer: Self-pay | Admitting: Physical Therapy

## 2023-09-28 DIAGNOSIS — R293 Abnormal posture: Secondary | ICD-10-CM | POA: Diagnosis not present

## 2023-09-28 DIAGNOSIS — M5441 Lumbago with sciatica, right side: Secondary | ICD-10-CM | POA: Diagnosis not present

## 2023-09-28 DIAGNOSIS — R2689 Other abnormalities of gait and mobility: Secondary | ICD-10-CM

## 2023-09-28 DIAGNOSIS — G8929 Other chronic pain: Secondary | ICD-10-CM | POA: Diagnosis not present

## 2023-10-02 ENCOUNTER — Other Ambulatory Visit (HOSPITAL_COMMUNITY): Payer: Self-pay

## 2023-10-02 ENCOUNTER — Other Ambulatory Visit: Payer: Self-pay

## 2023-10-03 ENCOUNTER — Encounter: Payer: Self-pay | Admitting: Physical Therapy

## 2023-10-03 ENCOUNTER — Other Ambulatory Visit (HOSPITAL_COMMUNITY): Payer: Self-pay

## 2023-10-03 ENCOUNTER — Ambulatory Visit: Payer: 59 | Admitting: Physical Therapy

## 2023-10-03 DIAGNOSIS — R293 Abnormal posture: Secondary | ICD-10-CM | POA: Diagnosis not present

## 2023-10-03 DIAGNOSIS — R2689 Other abnormalities of gait and mobility: Secondary | ICD-10-CM | POA: Diagnosis not present

## 2023-10-03 DIAGNOSIS — G8929 Other chronic pain: Secondary | ICD-10-CM

## 2023-10-03 DIAGNOSIS — M5441 Lumbago with sciatica, right side: Secondary | ICD-10-CM | POA: Diagnosis not present

## 2023-10-03 NOTE — Therapy (Signed)
OUTPATIENT PHYSICAL THERAPY TREATMENT NOTE   Patient Name: Brandon Gordon MRN: [id number] DOB:Jan 10, 1971, 52 y.o., male Today's Date: 10/03/2023  END OF SESSION:  PT End of Session - 10/03/23 1330     Visit Number 10    Number of Visits 13    Date for PT Re-Evaluation 11/16/23    Authorization Type [employer] [place]    PT Start Time 1330    PT Stop Time 1416    PT Time Calculation (min) 46 min    Activity Tolerance Patient tolerated treatment well    Behavior During Therapy WFL for tasks assessed/performed                    Past Medical History:  Diagnosis Date   Anxiety    At risk for sleep apnea    STOP-BANG= 5      SENT TO PCP 04-05-2016   Chronic pain syndrome    History of kidney stones    Hyperlipidemia    Hypertension    Left ureteral stone    OA (osteoarthritis)    back, hip   Pain management    Type 2 diabetes mellitus (HCC)    Past Surgical History:  Procedure Laterality Date   TOTAL HIP ARTHROPLASTY Right 04-22-2011   There are no problems to display for this patient.   PCP: Noberto Retort, MD   REFERRING PROVIDER: Bedelia Person, MD   REFERRING DIAG: [phone] (ICD-10-CM) - Spondylosis without myelopathy or radiculopathy, lumbar region   Rationale for Evaluation and Treatment: Rehabilitation  THERAPY DIAG:  Chronic bilateral low back pain with right-sided sciatica - Plan: PT plan of care cert/re-cert  Abnormal posture - Plan: PT plan of care cert/re-cert  Other abnormalities of gait and mobility - Plan: PT plan of care cert/re-cert  ONSET DATE: 20 years of back pain/ scoliosis,  recently pain in back and continuing to bend back  SUBJECTIVE:                                                                                                                                                                                           SUBJECTIVE STATEMENT: 10/03/2023  After last session , Brandon Gordon after the reverse lunge exercise had   pain R knee feeling subluxed and pt wore sleeve due to discomfort.  Pt also complains of R anterior LE/(tibialis and R foot swollen on Thursday night and over the weekend got really swollen.  Today I am doing better but still with ankle pain and I think my HOKA shoes are too wide. And the NEW Balance are better for me today.  Back is doing better but I strained my R ankle.  Today my pain in my R ankle is 4-5/10.  Back is 2/10.  May need to go easier today.    EVAL-  My wife is [name] PT at [employer]  I am now in a job in which I have to bend over a desk.  I am 6'6"   I played basketball [address], [address] and [employer] in college.  Pain in back  is overwhelming. I have a special needs son. I have been having pain that really effects my life. I have severe scoliosis and a hx of compressed L4 L5 . I feel like the hunchback of [facility].  I also swim at [facility]  and the [facility] in winter.  I have been in a new job with about 8 hours of sitting.   I do not have a standing desk. I work 40% in office and 60% at home.  I have had dry needling and it was helpful and water. 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  I use 20lb  DB to do bench press etc.   Pt with long history of back pain and lifelong dx of scoliosis  PERTINENT HISTORY:  Chronic pain syndrome, hyperlipidemia, HTN, OA DM Type 2, Pain managament. THR R 12 years ago May 2012.  Left shoulder hypermobility and chronic dislocation since basketball days. Surgery of 5th toe infection March 2003. Anxiety  PAIN:  Are you having pain? Yes: NPRS scale: at rest 3/10 and  7/10 Pain location: low back radiating into R buttock and thoracic pain at scoliotic curve Pain description: sharp constant and spasming Aggravating factors: sitting for longer than 30 min, walking for longer  than 5-8 min and begin spasm Relieving factors: Rest Household chores and carrying laundry, any walking  and unable to do any gold swing  PRECAUTIONS: None  RED FLAGS: Bowel or bladder  incontinence: No and Spinal tumors: No   WEIGHT BEARING RESTRICTIONS: No  FALLS:  Has patient fallen in last 6 months? 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  PLOF: Independent  PATIENT GOALS: To be able to walk again and try to play golf , try to strengthen safely  NEXT MD VISIT: TBD  OBJECTIVE: (objective measures completed at initial evaluation unless otherwise dated)   DIAGNOSTIC FINDINGS:  Recently taken at [facility]  PATIENT SURVEYS:  FOTO 47% Predicted 56% 10-03-23  60%  SCREENING FOR RED FLAGS: Bowel or bladder incontinence: No Spinal tumors: No   COGNITION: Overall cognitive status: Within functional limits for tasks assessed     SENSATION: No numbness but sometimes tingling on top of feet and I sometimes have fasciculations  MUSCLE LENGTH: :Thomas test Right  WNL able to flatten deg; Left -32 from horizontal deg  Hamstrings: Right 74 deg; Left 69 deg  1/2 range of bridge only  POSTURE: rounded shoulders, forward head, and scoliosis with R elevated pelvis and scoliotic curve leaning to the right in sitting and standing with flexed and  rotated to Left posture  PALPATION: Pt with tightened Left QL, tightened bil hamstrings and  especially Left hip flexor.    LUMBAR ROM: P! -  Pain severity  AROM eval 10-03-23  Flexion Finger tips to mid shins  Fingertips to lower 1/3 of shin  Extension -15 extension P! Unable to come to neutral ext -15 extension P! Unable to come to neutral ext  Right lateral flexion Finger tip 1.5 inch above knee jt line P! Fingertips to 1.75 inch above knee jt line No pain  Left lateral flexion Finger tip to fibular head  Finger tip  to  just below fibular head   Right rotation 50% 50%  Left rotation 50% Stands in left rotation 50% Stands in left rotation   (Blank rows = not tested)  LOWER EXTREMITY ROM:     Active  Right eval Left eval R/L 09-28-23  Hip flexion Supine 120 Supine 100 unable to come to neutral Supine 120/supine 109  Hip extension     Hip abduction     Hip adduction     Hip internal rotation 22P!!! 12 P!!! 25/ 12 blocked bony  Hip external rotation 60 55 60/55  Knee flexion     Knee extension     Ankle dorsiflexion     Ankle plantarflexion     Ankle inversion     Ankle eversion      (Blank rows = not tested) 10-03-23  Thomas test +   left 40 from horizontal, R -5 from neutral LOWER EXTREMITY MMT:    MMT Right eval Left eval R/L 10-03-23  Hip flexion 4+ 4+ 4+/4+  Hip extension 3-/ restricted  motion unable to come to neutral  4-/3-/ restricted  motion unable to come to neutral  Hip abduction 3+ 4- 4-/4-  Hip adduction     Hip internal rotation     Hip external rotation     Knee flexion 5 4+ 5/4+  Knee extension 5 5 5/5  Ankle dorsiflexion     Ankle plantarflexion     Ankle inversion     Ankle eversion      (Blank rows = not tested)   FUNCTIONAL TESTS:  5 times sit to stand: 17.95 sec  09-28-23  5 x STS    10.65 sec  GAIT: Distance walked: 150 feet Assistive device utilized: None Level of assistance: Complete Independence Comments: Ambulates with flexed posture  with left and rotation to left posture.  Pt with left pelvic level higher than Right and concave scoliosis curve on Right  TODAY'S TREATMENT:  [facility] Adult PT Treatment:                                                DATE: 10-03-23 Recertification FOTO  60% Therapeutic Exercise: STS at 22 inches from floor for correct seated 90/90 hip flexion/knee flexion.  With 30# KB squat 2 x 10 Side plank on R side-lying only  2 x 10 reps Gastroc Stretch on Wall 3 reps - 30 sec hold Soleus Stretch on Wall   1 sets - 3 reps -  30 sec hold SLR with VMO concentration 2 x 10   Manual Therapy: Ankle talocrural mobs R tibialis anterior STW  Self Care: Discussed golfing and recreation limitations Need for brace for lateral motion of R knee with trunk rotation Online scoliosis education given [facility] Adult PT Treatment:  DATE: 09-28-23 Therapeutic Exercise: Using bil UE hiking sticks, simultaneously  reverse lunging on R and L 1 x 10 Pt fatigueing and stumbling to R with CGA x 1 PT in contact. Deep squats x 5 Back elongation while doing sink squat   Bil UE hiking sticks with alternating trunk rotation with mini lunge Deadlift ladder with 25 # KB, 45# KB and llift bar with 65#  5 x with rest and back extension between sets Sit to stand x 5   , also STS 5x in 10.86 sec Manual Therapy: STW to all TPDN muscles with decreased muscle tension Thoracic and lumbar grade III mobs Trigger Point Dry-Needling performed     by Garen Lah Treatment instructions: Expect mild to moderate muscle soreness. S/S of pneumothorax if dry needled over a lung field, and to seek immediate medical attention should they occur. Patient verbalized understanding of these instructions and education.  Patient Consent Given: Yes Education handout provided: Previously provided Muscles treated  R QL  right only  T -4 to T-10    Electrical stimulation performed: No Parameters: N/A Treatment response/outcome: twitch response noted, pt noted relief Modalites  Ice pack to R knee and moist hot pack to R QL  Therapeutic Treatment:                                                DATE: 09-26-23 Therapeutic Exercise: Physio ball  green bridging and decompression of spine x 15 Physio ball forward lean and to the Right for derotation with deep breaths Deadlift fo 55 # with lift bar and elevated on 18 inch boxes.  7 x  with PT CGA x 1  Manual Therapy: STW to all TPDN muscles with decreased muscle  tension Trigger Point Dry-Needling performed     by Garen Lah Treatment instructions: Expect mild to moderate muscle soreness. S/S of pneumothorax if dry needled over a lung field, and to seek immediate medical attention should they occur. Patient verbalized understanding of these instructions and education.  Patient Consent Given: Yes Education handout provided: Previously provided Muscles treated  R QL  thoracic left /right T -4 to T-8   R gluteals, R piriformis Electrical stimulation performed: No Parameters: N/A Treatment response/outcome: twitch response noted, pt noted relief  Therapeutic Activity: Problems solving decreasing pain with left side and forward bending and developing sciatica.   Pt simulating problem positions and PT offering derotation suggestions to lengthen spine and utilize sciatic nerve gliding  Self Care: Discussed online accountability and scoliosis specific group exercise and resources Use of skeleton for scoliosis education   [facility] Adult PT Treatment:                                                DATE: 09-21-23 Therapeutic Exercise: Standing on 4 inch step in door way with Left LE hanging and trying to elongate spine on door frame swinging left LE Same position,  hip extension x 10 maximally (just past neutral for pt) Standing on 4 inch step in door way with right  LE hanging and trying to elongate spine on door frame swinging le right LE Same position,  hip extension x 10 maximally Using bil UE hiking sticks, simultaneously lunging on  R and L 2 x 10 Bil UE hiking sticks with alternating trunk rotation with mini lunge Manual Therapy: STW to all TPDN muscles with decreased muscle tension Trigger Point Dry-Needling performed     by Garen Lah Treatment instructions: Expect mild to moderate muscle soreness. S/S of pneumothorax if dry needled over a lung field, and to seek immediate medical attention should they occur. Patient verbalized understanding  of these instructions and education.  Patient Consent Given: Yes Education handout provided: Previously provided Muscles treated  left QL  thoracic left /right T -4 to T-8 , Left / right UT, Left infraspinatus  bil QL   R gluteals Electrical stimulation performed: No Parameters: N/A Treatment response/outcome: twitch response noted, pt noted relief  [facility] Adult PT Treatment:                                                DATE: 09/18/23 Therapeutic Exercise: Hooklying breathing 3 sets of 5 breaths with emphasis on L ribcage expansion with tactile cues, deep breathing for muscular relaxation, education on pacing and normal breathing during rest breaks  7# unilat row CC RUE with rotation to R, CGA with staggered stance, cues for appropriate ROM/mechanics 3# punches CC LUE with rotation to R, cues for follow through and CGA given staggered stance L hip flexor isometric push 2x8 with UE support on wall  Standing L hip flexor stretch + contralateral trunk rotation x5; use of treadmill for LUE support, cueing for pelvic positioning and posture, head/neck positioning HEP update + education and provision of resistance bands, continued education/discussion on relevant anatomy/physiology as it pertains to pain and rationale for interventions    [facility] Adult PT Treatment:                                                DATE: 09/07/23 Therapeutic Exercise: Banded scaption red band around wrists, 2x8; emphasis on posture, and consistent tension Standing triple flexion/extension LLE only, RUE support on counter 2x8 cues for breath control and posture CC R rotational row 10# x8 with LUE support (ski pole) heavy tactile cues to reduce compensations CC R rotational row 7# x8 with L UE support Continued education/discussion re: HEP and appropriate exercise   [facility] Adult PT Treatment:                                                DATE: 09/05/23 Therapeutic Exercise: Suitcase carry 15# 6x54ft cues for upright  posture, RUE only  Seated high>low row CC 10# x15 BIL (performed unilaterally) Seated high>low row with blue band unilat, x8 BIL education for home setup Standing L hip flexor stretch + contralateral trunk rotation x10; use of treadmill for LUE support, cueing for pelvic positioning Standing L hip flexor stretch + ipsilateral UE reach x10; use of treadmill for RUE support, cueing for pelvic positioning Continuing to spend time with education/discussion re: relevant anatomy/physiology, exercise tolerance, rationale for interventions    [facility] Adult PT Treatment:  DATE: 08/31/23 Therapeutic Exercise: LTR x10  Open book x10 R side only (L sidelying) L QL stretch w/ physioball fulcrum, 2x8 CGA given mild instability during lean, increased time for setup/performance, cues for appropriate ball height at hip Standing L hip flexor stretch + contralateral trunk rotation 2x10; use of treadmill for LUE support, cueing for pelvic positioning Significant time spent w/ education/discussion re: pt exercise outside of session, relevant anatomy/physiology as it pertains to exercise, and rationale for interventions    [facility] Adult PT Treatment:                                                DATE: 08-28-23 Therapeutic Exercise: Left hip flexor stretch 3 x 30 sec hold Supine LTR Standing on box and elongating Left side while holding onto door frame Manual Therapy: STW to all TPDN muscles with decreased muscle tension Trigger Point Dry-Needling performed     by Garen Lah Treatment instructions: Expect mild to moderate muscle soreness. S/S of pneumothorax if dry needled over a lung field, and to seek immediate medical attention should they occur. Patient verbalized understanding of these instructions and education.  Patient Consent Given: Yes Education handout provided: Previously provided Muscles treated  left QL  thoracic left T -4 to T-8 , Left UT, Left  infraspinatus Electrical stimulation performed: No Parameters: N/A Treatment response/outcome: twitch response noted, pt noted relief  Self Care: Educaton on curve and scoliosis and rotational curve                                                                                                                              DATE: 08-24-23  Eval    PATIENT EDUCATION:  Education details: rationale for interventions Person educated: Patient Education method: Explanation, Demonstration, Tactile cues, and Verbal cues Education comprehension: verbalized understanding, returned demonstration, verbal cues required, and tactile cues required  HOME EXERCISE PROGRAM:  Access Code: [email or web address] URL: [email or web address] Date: 09/18/2023 Prepared by: Fransisco Hertz  Program Notes - with lat pull down, please keep elbows close to body, and do one arm at a time  Exercises - Seated Lat Pull Down with Resistance - Elbows Bent  - 2-3 x daily - 7 x weekly - 1 sets - 8 reps - Standing Single Arm Low Row with Anchored Resistance  - 2-3 x daily - 7 x weekly - 1 sets - 8 reps - Single Arm Punch with Resistance  - 2-3 x daily - 7 x weekly - 1 sets - 8 reps Added to HEP 09-26-23 Program Notes - with lat pull down, please keep elbows close to body, and do one arm at a timePhysio ball forward and to the right push forward. - [place] Deadlift With Barbell  - 1 x daily - 7 x weekly - 3 sets - 10 reps - Supine  Bridge with Heels on [employer] and [employer]  - 1 x daily - 7 x weekly - 2 sets - 10 reps Added 10-03-23  - Goblet Squat with Kettlebell  - 1 x daily - 7 x weekly - 3 sets - 10 reps - Side plank on R side-lying only  - 1 x daily - 7 x weekly - 3 sets - 10 reps - Gastroc Stretch on Wall  - 1 x daily - 7 x weekly - 1 sets - 3 reps - 30 sec hold - Soleus Stretch on Wall  - 1 x daily - 7 x weekly - 1 sets - 3 reps - 30 sec hold ASSESSMENT:  CLINICAL IMPRESSION: 10/03/2023 Pt arrived w/  report of swelling of R knee post exercise last visit but has since resolved.  Pt with tibialis anterior strain but also resolving after rest this past weekend.  Pt with overall 4/10 pain. Pt has 40 degree deficit with Thomas test in supine on R hip and bony endfeel and pt states he would like to get a THA at some point.   Pt is making improvement  with FOTO achieving 60% from predicted 56%.  Pt is not walking more than 10 minutes and goal is 25 minutes due to pain in low back and moderately severe scoliosis.  Pt was given handout on buttress brace he can investigate.  Pt needs to perform goblet squats with 24 inches at least for proper form.  .Pt with No adverse events, pt departs with report of muscular fatigue but no increase in overt pain.  Pt departs today's session in no acute distress, all voiced questions/concerns addressed appropriately from PT perspective.  Pt will benefit from extension for PT to address and improve LTG to maximize functional independence for pt to return to safer leasure and out door activities.      EVAL- Patient is a 52 y.o. male who was seen today for physical therapy evaluation and treatment for spondylosis  and moderate scoliosis with flexed posture and scoliosis rotated to left with convex on Right. Including R sided sciatic pain.  Pt has has long history of back pain and lifetime dx of scoliosis.  He has thoracic.lumbar spasming into R buttock.  Pt with flexibility issues contributing to pain.He has a job In which he sits and works on [employer] also contributing to his over flexed posture. Pt would benefit form standing desk and HEP/helps to decrease seated time. Pt will benefit from skilled PT to reduce pain and increase strength and flexibility to return to as neutral as possible. Pt needs to care for special needs son. He is limited due to pain in daily sedentary job and with sleep at night. He desires to return to a more active lifestyle playing racquet sports and golf as  able but his sedentary job adds to his flexed and scoliotic posture. Will continue to maximize AROM and strength as able to add more varied leisure activities     OBJECTIVE IMPAIRMENTS: decreased activity tolerance, decreased knowledge of condition, difficulty walking, decreased ROM, decreased strength, increased fascial restrictions, improper body mechanics, postural dysfunction, and obesity.   ACTIVITY LIMITATIONS: carrying, bending, sitting, standing, squatting, sleeping, stairs, locomotion level, and stairs  PARTICIPATION LIMITATIONS: driving, community activity, occupation, and caring for special needs son  PERSONAL FACTORS: Chronic pain syndrome, hyperlipidemia, HTN, OA DM Type 2, Pain managament. THR R 12 years ago May 2012.  Left shoulder hypermobility and chronic dislocation since basketball days. Surgery of 5th  toe infection March 2003. Anxiety are also affecting patient's functional outcome.   REHAB POTENTIAL: Good  CLINICAL DECISION MAKING: Evolving/moderate complexity  EVALUATION COMPLEXITY: Moderate   GOALS: Goals reviewed with patient? Yes  SHORT TERM GOALS: Target date: 09-14-23  Pt will be independent with initial HEP Baseline:no knowledge Goal status:MET  2.  Pt will work on sleep positions to increase more comfort in order to have at least 5 or more hours of restorative sleep Baseline:  tosses and turns at night and has increased pain Goal status:MET  3.  Demonstrate understanding of  as neutral posture as possible with scoliosis and be more conscious of position and posture throughout the day.  Baseline: no knowledge Goal status: MET  4.  Pain with going up and down steps with maximum safety Baseline: Pt descends steps backwards due to size 16 shoe 09-28-23  Pt continues to descend backwards for safety due to large sixe 16 feet and safety Goal status:MET   LONG TERM GOALS: Target date: 10-05-23 revised  11-16-23  Pt will be independent with advanced HEP  using gym equipment and join gym upon DC Baseline: is not a part of gym Goal status: REVISED  2.  Pt will be able to walk for  25 minutes using pain modification with position and stretching Baseline: Can only walk for 5-8 minutes 10-03-23  10 min  Goal status: REVISED  3.  Pt will be able to pick up 30 to 45# KB with proper form for exercise Baseline:  09-26-23  working on 55 # lift bar Goal status: REVISED  4.  Pt will be able to improve flexibility in order to try to begin racquet sports /golf as able.  Baseline: unable to participate now 10-03-23  Close to being able to make a full swing with golf club Goal status:ONGOING  5.  Pain with activity will decrease  to 3/10 instead of 7/10 Baseline: At worse with functional activity 7/10 10-03-23  Pt pain at 4/10 today  Goal status: ONGOING  6.  Pt will simulate golfing/ racquet sports and add strengthening/flexibility exercises that will enhance ability Baseline: Unable to  play sports at present due to spasming 10-03-23  Pt with difficulty turning neck while using gold club.  Close to doing a swing. Goal status: ONGOING  7. Pt will investigate bracing for R knee in order to protect knee for trunk and rotational dynamic lunging exercises  Baseline,  R knee with patellar and lateral movement  Goal: INITIAL   PLAN:  PT FREQUENCY: 1-2x/week  PT DURATION: 6 weeks  PLANNED INTERVENTIONS: Therapeutic exercises, Therapeutic activity, Neuromuscular re-education, Balance training, Gait training, Patient/Family education, Self Care, Joint mobilization, Stair training, Dry Needling, Electrical stimulation, Spinal mobilization, Cryotherapy, Moist heat, Taping, Ionotophoresis 4mg /ml Dexamethasone, Manual therapy, and Re-evaluation.  PLAN FOR NEXT SESSION: review/update HEP PRN. Continue with multiplanar rotational activities, hip flexor mobility, working into postural extension. FOTO next session. Pt voices interest in continuing dry  needling.   Work on opening up Left hip.  Garen Lah, PT, ATRIC Certified Exercise Expert for the Aging Adult  10/03/23 3:00 PM Phone: [phone] Fax: [phone]

## 2023-10-05 ENCOUNTER — Ambulatory Visit: Payer: 59 | Admitting: Physical Therapy

## 2023-10-05 ENCOUNTER — Encounter: Payer: Self-pay | Admitting: Physical Therapy

## 2023-10-05 DIAGNOSIS — M5441 Lumbago with sciatica, right side: Secondary | ICD-10-CM | POA: Diagnosis not present

## 2023-10-05 DIAGNOSIS — R293 Abnormal posture: Secondary | ICD-10-CM | POA: Diagnosis not present

## 2023-10-05 DIAGNOSIS — R2689 Other abnormalities of gait and mobility: Secondary | ICD-10-CM | POA: Diagnosis not present

## 2023-10-05 DIAGNOSIS — G8929 Other chronic pain: Secondary | ICD-10-CM | POA: Diagnosis not present

## 2023-10-05 NOTE — Therapy (Signed)
OUTPATIENT PHYSICAL THERAPY TREATMENT NOTE   Patient Name: Brandon Gordon MRN: [id number] DOB:09-Nov-1971, 52 y.o., male Today's Date: 10/05/2023  END OF SESSION:  PT End of Session - 10/05/23 1335     Visit Number 11    Number of Visits 16    Date for PT Re-Evaluation 11/16/23    Authorization Type [place] [employer]    PT Start Time 1330    PT Stop Time 1415    PT Time Calculation (min) 45 min    Activity Tolerance Patient tolerated treatment well;Patient limited by pain    Behavior During Therapy [facility] for tasks assessed/performed                     Past Medical History:  Diagnosis Date   Anxiety    At risk for sleep apnea    STOP-BANG= 5      SENT TO PCP 04-05-2016   Chronic pain syndrome    History of kidney stones    Hyperlipidemia    Hypertension    Left ureteral stone    OA (osteoarthritis)    back, hip   Pain management    Type 2 diabetes mellitus (HCC)    Past Surgical History:  Procedure Laterality Date   TOTAL HIP ARTHROPLASTY Right 04-22-2011   There are no problems to display for this patient.   PCP: Noberto Retort, MD   REFERRING PROVIDER: Bedelia Person, MD   REFERRING DIAG: [id number] (ICD-10-CM) - Spondylosis without myelopathy or radiculopathy, lumbar region   Rationale for Evaluation and Treatment: Rehabilitation  THERAPY DIAG:  Chronic bilateral low back pain with right-sided sciatica  Abnormal posture  Other abnormalities of gait and mobility  ONSET DATE: 20 years of back pain/ scoliosis,  recently pain in back and continuing to bend back  SUBJECTIVE:                                                                                                                                                                                           SUBJECTIVE STATEMENT: 10/05/2023  Pt comes in clinic with R knee wrapped with compressive sleeve and ace wraps. Pt with 4/10 tibialis anterior pain.getting slightly better today..   I  brought my golfing club. . And the NEW Balance are better for me today.   Back is doing better but I strained my R ankle.  Today my pain in my R ankle is 2/10.  Back is 2/10.     EVAL-  My wife is [name] PT at [employer]  I am now in a job in which I have to bend  over a desk.  I am 6'6"   I played basketball [address], [address] and [employer] in college.  Pain in back  is overwhelming. I have a special needs son. I have been having pain that really effects my life. I have severe scoliosis and a hx of compressed L4 L5 . I feel like the hunchback of [facility].  I also swim at [facility]  and the [facility] in winter.  I have been in a new job with about 8 hours of sitting.   I do not have a standing desk. I work 40% in office and 60% at home.  I have had dry needling and it was helpful and water. 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  I use 20lb  DB to do bench press etc.   Pt with long history of back pain and lifelong dx of scoliosis  PERTINENT HISTORY:  Chronic pain syndrome, hyperlipidemia, HTN, OA DM Type 2, Pain managament. THR R 12 years ago May 2012.  Left shoulder hypermobility and chronic dislocation since basketball days. Surgery of 5th toe infection March 2003. Anxiety  PAIN:  Are you having pain? Yes: NPRS scale: at rest 3/10 and  7/10 Pain location: low back radiating into R buttock and thoracic pain at scoliotic curve Pain description: sharp constant and spasming Aggravating factors: sitting for longer than 30 min, walking for longer  than 5-8 min and begin spasm Relieving factors: Rest Household chores and carrying laundry, any walking  and unable to do any gold swing  PRECAUTIONS: None  RED FLAGS: Bowel or bladder incontinence: No and Spinal tumors: No   WEIGHT BEARING RESTRICTIONS: No  FALLS:  Has patient fallen in last 6 months? 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  PLOF: Independent  PATIENT GOALS: To be able to walk again and try to play golf , try to strengthen safely  NEXT MD VISIT: TBD  OBJECTIVE: (objective measures completed at initial evaluation unless otherwise dated)   DIAGNOSTIC FINDINGS:  Recently taken at [facility] medical  PATIENT SURVEYS:  FOTO 47% Predicted 56% 10-03-23  60%  SCREENING FOR RED FLAGS: Bowel or bladder incontinence: No Spinal tumors: No   COGNITION: Overall cognitive status: Within functional limits for tasks assessed     SENSATION: No numbness but sometimes tingling on top of feet and I sometimes have fasciculations  MUSCLE LENGTH: :Thomas test Right  WNL able to flatten deg; Left -32 from horizontal deg  Hamstrings: Right 74 deg; Left 69 deg  1/2 range of bridge only  POSTURE: rounded shoulders, forward head, and scoliosis with R elevated pelvis and scoliotic curve leaning to the right in sitting and standing with flexed and  rotated to Left posture  PALPATION: Pt with tightened Left QL, tightened bil hamstrings and  especially Left hip flexor.    LUMBAR ROM: P! - Pain severity  AROM eval 10-03-23  Flexion Finger tips to mid shins  Fingertips to lower 1/3 of shin  Extension -15 extension P! Unable to come to neutral ext -15 extension P! Unable to come to neutral ext  Right lateral flexion Finger tip 1.5 inch above knee jt line P! Fingertips to 1.75 inch  above knee jt line No pain  Left lateral flexion Finger tip to fibular head  Finger tip to  just below fibular head   Right rotation 50% 50%  Left rotation 50% Stands in left rotation 50% Stands in left rotation   (Blank rows = not tested)  LOWER EXTREMITY ROM:     Active  Right eval Left eval R/L 09-28-23  Hip flexion Supine 120 Supine 100 unable to come to neutral  Supine 120/supine 109  Hip extension     Hip abduction     Hip adduction     Hip internal rotation 22P!!! 12 P!!! 25/ 12 blocked bony  Hip external rotation 60 55 60/55  Knee flexion     Knee extension     Ankle dorsiflexion     Ankle plantarflexion     Ankle inversion     Ankle eversion      (Blank rows = not tested) 10-03-23  Thomas test +   left 40 from horizontal, R -5 from neutral LOWER EXTREMITY MMT:    MMT Right eval Left eval R/L 10-03-23  Hip flexion 4+ 4+ 4+/4+  Hip extension 3-/ restricted  motion unable to come to neutral  4-/3-/ restricted  motion unable to come to neutral  Hip abduction 3+ 4- 4-/4-  Hip adduction     Hip internal rotation     Hip external rotation     Knee flexion 5 4+ 5/4+  Knee extension 5 5 5/5  Ankle dorsiflexion     Ankle plantarflexion     Ankle inversion     Ankle eversion      (Blank rows = not tested)   FUNCTIONAL TESTS:  5 times sit to stand: 17.95 sec  09-28-23  5 x STS    10.65 sec  GAIT: Distance walked: 150 feet Assistive device utilized: None Level of assistance: Complete Independence Comments: Ambulates with flexed posture  with left and rotation to left posture.  Pt with left pelvic level higher than Right and concave scoliosis curve on Right  TODAY'S TREATMENT:  [facility] Adult PT Treatment:                                                DATE: 10-05-23  Manual Therapy: MccConnell tape of  R knee lateral to medial STW to all TPDN muscles with decreased muscle tension Thoracic and lumbar grade III mobs Trigger Point Dry-Needling performed     by Garen Lah Treatment instructions: Expect mild to moderate muscle soreness. S/S of pneumothorax if dry needled over a lung field, and to seek immediate medical attention should they occur. Patient verbalized understanding of these instructions and education.  Patient Consent Given: Yes Education handout provided: Previously provided Muscles treated  R QL  right only  T -4  to T-10  . R Lat  R UT Electrical stimulation performed: No Parameters: N/A Treatment response/outcome: twitch response noted, pt noted relief Modalites  Ice pack to R knee and moist hot pack to R QL.  Therapeutic Activity: Golfing simulation rotating for R and to left working on hip rotation Practicing Tai Chi motion with left UE serratus punch while retracting R UE for derotation of Walking across grass on non compliant surface to simulate walking on ground on golf course   [facility] Adult PT Treatment:  DATE: 10-03-23 Recertification FOTO  60% Therapeutic Exercise: STS at 22 inches from floor for correct seated 90/90 hip flexion/knee flexion.  With 30# KB squat 2 x 10 Side plank on R side-lying only  2 x 10 reps Gastroc Stretch on Wall 3 reps - 30 sec hold Soleus Stretch on Wall   1 sets - 3 reps - 30 sec hold SLR with VMO concentration 2 x 10   Manual Therapy: Ankle talocrural mobs R tibialis anterior STW  Self Care: Discussed golfing and recreation limitations Need for brace for lateral motion of R knee with trunk rotation Online scoliosis education given [facility] Adult PT Treatment:                                                DATE: 09-28-23 Therapeutic Exercise: Using bil UE hiking sticks, simultaneously  reverse lunging on R and L 1 x 10 Pt fatigueing and stumbling to R with CGA x 1 PT in contact. Deep squats x 5 Back elongation while doing sink squat   Bil UE hiking sticks with alternating trunk rotation with mini lunge Deadlift ladder with 25 # KB, 45# KB and llift bar with 65#  5 x with rest and back extension between sets Sit to stand x 5   , also STS 5x in 10.86 sec Manual Therapy: STW to all TPDN muscles with decreased muscle tension Thoracic and lumbar grade III mobs Trigger Point Dry-Needling performed     by Garen Lah Treatment instructions: Expect mild to moderate muscle soreness. S/S of pneumothorax if dry  needled over a lung field, and to seek immediate medical attention should they occur. Patient verbalized understanding of these instructions and education.  Patient Consent Given: Yes Education handout provided: Previously provided Muscles treated  R QL  right only  T -4 to T-10    Electrical stimulation performed: No Parameters: N/A Treatment response/outcome: twitch response noted, pt noted relief Modalites  Ice pack to R knee and moist hot pack to R QL  Therapeutic Treatment:                                                DATE: 09-26-23 Therapeutic Exercise: Physio ball  green bridging and decompression of spine x 15 Physio ball forward lean and to the Right for derotation with deep breaths Deadlift fo 55 # with lift bar and elevated on 18 inch boxes.  7 x  with PT CGA x 1  Manual Therapy: STW to all TPDN muscles with decreased muscle tension Trigger Point Dry-Needling performed     by Garen Lah Treatment instructions: Expect mild to moderate muscle soreness. S/S of pneumothorax if dry needled over a lung field, and to seek immediate medical attention should they occur. Patient verbalized understanding of these instructions and education.  Patient Consent Given: Yes Education handout provided: Previously provided Muscles treated  R QL  thoracic left /right T -4 to T-8   R gluteals, R piriformis Electrical stimulation performed: No Parameters: N/A Treatment response/outcome: twitch response noted, pt noted relief  Therapeutic Activity: Problems solving decreasing pain with left side and forward bending and developing sciatica.   Pt simulating problem positions and PT offering derotation suggestions to lengthen spine  and utilize sciatic nerve gliding  Self Care: Discussed online accountability and scoliosis specific group exercise and resources Use of skeleton for scoliosis education   [facility] Adult PT Treatment:                                                DATE:  09-21-23 Therapeutic Exercise: Standing on 4 inch step in door way with Left LE hanging and trying to elongate spine on door frame swinging left LE Same position,  hip extension x 10 maximally (just past neutral for pt) Standing on 4 inch step in door way with right  LE hanging and trying to elongate spine on door frame swinging le right LE Same position,  hip extension x 10 maximally Using bil UE hiking sticks, simultaneously lunging on R and L 2 x 10 Bil UE hiking sticks with alternating trunk rotation with mini lunge Manual Therapy: STW to all TPDN muscles with decreased muscle tension Trigger Point Dry-Needling performed     by Garen Lah Treatment instructions: Expect mild to moderate muscle soreness. S/S of pneumothorax if dry needled over a lung field, and to seek immediate medical attention should they occur. Patient verbalized understanding of these instructions and education.  Patient Consent Given: Yes Education handout provided: Previously provided Muscles treated  left QL  thoracic left /right T -4 to T-8 , Left / right UT, Left infraspinatus  bil QL   R gluteals Electrical stimulation performed: No Parameters: N/A Treatment response/outcome: twitch response noted, pt noted relief  [facility] Adult PT Treatment:                                                DATE: 09/18/23 Therapeutic Exercise: Hooklying breathing 3 sets of 5 breaths with emphasis on L ribcage expansion with tactile cues, deep breathing for muscular relaxation, education on pacing and normal breathing during rest breaks  7# unilat row CC RUE with rotation to R, CGA with staggered stance, cues for appropriate ROM/mechanics 3# punches CC LUE with rotation to R, cues for follow through and CGA given staggered stance L hip flexor isometric push 2x8 with UE support on wall  Standing L hip flexor stretch + contralateral trunk rotation x5; use of treadmill for LUE support, cueing for pelvic positioning and posture,  head/neck positioning HEP update + education and provision of resistance bands, continued education/discussion on relevant anatomy/physiology as it pertains to pain and rationale for interventions    [facility] Adult PT Treatment:                                                DATE: 09/07/23 Therapeutic Exercise: Banded scaption red band around wrists, 2x8; emphasis on posture, and consistent tension Standing triple flexion/extension LLE only, RUE support on counter 2x8 cues for breath control and posture CC R rotational row 10# x8 with LUE support (ski pole) heavy tactile cues to reduce compensations CC R rotational row 7# x8 with L UE support Continued education/discussion re: HEP and appropriate exercise   [facility] Adult PT Treatment:  DATE: 09/05/23 Therapeutic Exercise: Suitcase carry 15# 6x66ft cues for upright posture, RUE only  Seated high>low row CC 10# x15 BIL (performed unilaterally) Seated high>low row with blue band unilat, x8 BIL education for home setup Standing L hip flexor stretch + contralateral trunk rotation x10; use of treadmill for LUE support, cueing for pelvic positioning Standing L hip flexor stretch + ipsilateral UE reach x10; use of treadmill for RUE support, cueing for pelvic positioning Continuing to spend time with education/discussion re: relevant anatomy/physiology, exercise tolerance, rationale for interventions    [facility] Adult PT Treatment:                                                DATE: 08/31/23 Therapeutic Exercise: LTR x10  Open book x10 R side only (L sidelying) L QL stretch w/ physioball fulcrum, 2x8 CGA given mild instability during lean, increased time for setup/performance, cues for appropriate ball height at hip Standing L hip flexor stretch + contralateral trunk rotation 2x10; use of treadmill for LUE support, cueing for pelvic positioning Significant time spent w/ education/discussion re: pt exercise  outside of session, relevant anatomy/physiology as it pertains to exercise, and rationale for interventions    [facility] Adult PT Treatment:                                                DATE: 08-28-23 Therapeutic Exercise: Left hip flexor stretch 3 x 30 sec hold Supine LTR Standing on box and elongating Left side while holding onto door frame Manual Therapy: STW to all TPDN muscles with decreased muscle tension Trigger Point Dry-Needling performed     by Garen Lah Treatment instructions: Expect mild to moderate muscle soreness. S/S of pneumothorax if dry needled over a lung field, and to seek immediate medical attention should they occur. Patient verbalized understanding of these instructions and education.  Patient Consent Given: Yes Education handout provided: Previously provided Muscles treated  left QL  thoracic left T -4 to T-8 , Left UT, Left infraspinatus Electrical stimulation performed: No Parameters: N/A Treatment response/outcome: twitch response noted, pt noted relief  Self Care: Educaton on curve and scoliosis and rotational curve                                                                                                                              DATE: 08-24-23  Eval    PATIENT EDUCATION:  Education details: rationale for interventions Person educated: Patient Education method: Explanation, Demonstration, Tactile cues, and Verbal cues Education comprehension: verbalized understanding, returned demonstration, verbal cues required, and tactile cues required  HOME EXERCISE PROGRAM:  Access Code: [email or web address] URL: [email or web address] Date: 09/18/2023 Prepared by: Fransisco Hertz  Program  Notes - with lat pull down, please keep elbows close to body, and do one arm at a time  Exercises - Seated Lat Pull Down with Resistance - Elbows Bent  - 2-3 x daily - 7 x weekly - 1 sets - 8 reps - Standing Single Arm Low Row with Anchored Resistance  - 2-3 x  daily - 7 x weekly - 1 sets - 8 reps - Single Arm Punch with Resistance  - 2-3 x daily - 7 x weekly - 1 sets - 8 reps Added to HEP 09-26-23 Program Notes - with lat pull down, please keep elbows close to body, and do one arm at a timePhysio ball forward and to the right push forward. - [place] Deadlift With Barbell  - 1 x daily - 7 x weekly - 3 sets - 10 reps - Supine Bridge with Heels on Swiss Ball and Knees Bent  - 1 x daily - 7 x weekly - 2 sets - 10 reps Added 10-03-23 Goblet Squat with Kettlebell  - 1 x daily - 7 x weekly - 3 sets - 10 reps - Side plank on R side-lying only  - 1 x daily - 7 x weekly - 3 sets - 10 reps - Gastroc Stretch on Wall  - 1 x daily - 7 x weekly - 1 sets - 3 reps - 30 sec hold - Soleus Stretch on Wall  - 1 x daily - 7 x weekly - 1 sets - 3 reps - 30 sec hold  10-05-23 - Supine Sciatic Nerve Glide  - 1 x daily - 7 x weekly - 1 sets - 10 rep ASSESSMENT:  CLINICAL IMPRESSION: 10/05/2023 Pt arrived w/ report R ankle is 2/10.  Back is 2/10.  And tibialis anterior 4/10  While using golf club pt had pain in R back of 9/10 and needed to sit and lumbar stretch forward and to side to decrease pain.  Pt also given  Mc Connell taping lateral to medial pull for irritated R knee before encountering noncompliant surface with  hip rotation necessary for golf swing.  Pt limited in R handed golf swing due to R hip limitation in flexion.  Added Tai chi motion for alternating UE and LE motion. Pt  states he notices that overall he has greater motion but he still fatigues after standing only a short time.  Pt has braces he wears for support that he notes as helpful.    Pt departs today's session in no acute distress, all voiced questions/concerns addressed appropriately from PT perspective.  Pt will benefit from extension for PT to address and improve LTG to maximize functional independence for pt to return to safer leasure and out door activities.      EVAL- Patient is a 52 y.o. male  who was seen today for physical therapy evaluation and treatment for spondylosis  and moderate scoliosis with flexed posture and scoliosis rotated to left with convex on Right. Including R sided sciatic pain.  Pt has has long history of back pain and lifetime dx of scoliosis.  He has thoracic.lumbar spasming into R buttock.  Pt with flexibility issues contributing to pain.He has a job In which he sits and works on [employer] also contributing to his over flexed posture. Pt would benefit form standing desk and HEP/helps to decrease seated time. Pt will benefit from skilled PT to reduce pain and increase strength and flexibility to return to as neutral as possible. Pt needs to care for special  needs son. He is limited due to pain in daily sedentary job and with sleep at night. He desires to return to a more active lifestyle playing racquet sports and golf as able but his sedentary job adds to his flexed and scoliotic posture. Will continue to maximize AROM and strength as able to add more varied leisure activities     OBJECTIVE IMPAIRMENTS: decreased activity tolerance, decreased knowledge of condition, difficulty walking, decreased ROM, decreased strength, increased fascial restrictions, improper body mechanics, postural dysfunction, and obesity.   ACTIVITY LIMITATIONS: carrying, bending, sitting, standing, squatting, sleeping, stairs, locomotion level, and stairs  PARTICIPATION LIMITATIONS: driving, community activity, occupation, and caring for special needs son  PERSONAL FACTORS: Chronic pain syndrome, hyperlipidemia, HTN, OA DM Type 2, Pain managament. THR R 12 years ago May 2012.  Left shoulder hypermobility and chronic dislocation since basketball days. Surgery of 5th toe infection March 2003. Anxiety are also affecting patient's functional outcome.   REHAB POTENTIAL: Good  CLINICAL DECISION MAKING: Evolving/moderate complexity  EVALUATION COMPLEXITY: Moderate   GOALS: Goals reviewed with  patient? Yes  SHORT TERM GOALS: Target date: 09-14-23  Pt will be independent with initial HEP Baseline:no knowledge Goal status:MET  2.  Pt will work on sleep positions to increase more comfort in order to have at least 5 or more hours of restorative sleep Baseline:  tosses and turns at night and has increased pain Goal status:MET  3.  Demonstrate understanding of  as neutral posture as possible with scoliosis and be more conscious of position and posture throughout the day.  Baseline: no knowledge Goal status: MET  4.  Pain with going up and down steps with maximum safety Baseline: Pt descends steps backwards due to size 16 shoe 09-28-23  Pt continues to descend backwards for safety due to large sixe 16 feet and safety Goal status:MET   LONG TERM GOALS: Target date: 10-05-23 revised  11-16-23  Pt will be independent with advanced HEP using gym equipment and join gym upon DC Baseline: is not a part of gym Goal status: REVISED  2.  Pt will be able to walk for  25 minutes using pain modification with position and stretching Baseline: Can only walk for 5-8 minutes 10-03-23  10 min  Goal status: REVISED  3.  Pt will be able to pick up 30 to 45# KB with proper form for exercise Baseline:  09-26-23  working on 55 # lift bar Goal status: REVISED  4.  Pt will be able to improve flexibility in order to try to begin racquet sports /golf as able.  Baseline: unable to participate now 10-03-23  Close to being able to make a full swing with golf club Goal status:ONGOING  5.  Pain with activity will decrease  to 3/10 instead of 7/10 Baseline: At worse with functional activity 7/10 10-03-23  Pt pain at 4/10 today  Goal status: ONGOING  6.  Pt will simulate golfing/ racquet sports and add strengthening/flexibility exercises that will enhance ability Baseline: Unable to  play sports at present due to spasming 10-03-23  Pt with difficulty turning neck while using gold club.  Close to  doing a swing. Goal status: ONGOING  7. Pt will investigate bracing for R knee in order to protect knee for trunk and rotational dynamic lunging exercises  Baseline,  R knee with patellar and lateral movement  Goal: INITIAL   PLAN:  PT FREQUENCY: 1-2x/week  PT DURATION: 6 weeks  PLANNED INTERVENTIONS: Therapeutic exercises, Therapeutic activity, Neuromuscular re-education,  Balance training, Gait training, Patient/Family education, Self Care, Joint mobilization, Stair training, Dry Needling, Electrical stimulation, Spinal mobilization, Cryotherapy, Moist heat, Taping, Ionotophoresis 4mg /ml Dexamethasone, Manual therapy, and Re-evaluation.  PLAN FOR NEXT SESSION: review/update HEP PRN. Continue with multiplanar rotational activities, hip flexor mobility, working into postural extension.    Work on opening up Left hip.  Garen Lah, PT, [facility] Certified Exercise Expert for the Aging Adult  10/05/23 2:57 PM Phone: [phone]: [phone]

## 2023-10-10 ENCOUNTER — Ambulatory Visit: Payer: 59 | Admitting: Physical Therapy

## 2023-10-10 ENCOUNTER — Encounter: Payer: Self-pay | Admitting: Physical Therapy

## 2023-10-10 DIAGNOSIS — R293 Abnormal posture: Secondary | ICD-10-CM

## 2023-10-10 DIAGNOSIS — G8929 Other chronic pain: Secondary | ICD-10-CM

## 2023-10-10 DIAGNOSIS — M5441 Lumbago with sciatica, right side: Secondary | ICD-10-CM | POA: Diagnosis not present

## 2023-10-10 DIAGNOSIS — R2689 Other abnormalities of gait and mobility: Secondary | ICD-10-CM

## 2023-10-10 NOTE — Therapy (Signed)
OUTPATIENT PHYSICAL THERAPY TREATMENT NOTE   Patient Name: Brandon Gordon MRN: [id number] DOB:03/07/1971, 52 y.o., male Today's Date: 10/10/2023  END OF SESSION:  PT End of Session - 10/10/23 1505     Visit Number 12    Number of Visits 16    Date for PT Re-Evaluation 11/16/23    Authorization Type [employer]    PT Start Time 1505    PT Stop Time 1545    PT Time Calculation (min) 40 min    Activity Tolerance Patient tolerated treatment well;Patient limited by pain    Behavior During Therapy [facility] for tasks assessed/performed                      Past Medical History:  Diagnosis Date   Anxiety    At risk for sleep apnea    STOP-BANG= 5      SENT TO PCP 04-05-2016   Chronic pain syndrome    History of kidney stones    Hyperlipidemia    Hypertension    Left ureteral stone    OA (osteoarthritis)    back, hip   Pain management    Type 2 diabetes mellitus (HCC)    Past Surgical History:  Procedure Laterality Date   TOTAL HIP ARTHROPLASTY Right 04-22-2011   There are no problems to display for this patient.   PCP: Noberto Retort, MD   REFERRING PROVIDER: Bedelia Person, MD   REFERRING DIAG: [phone] (ICD-10-CM) - Spondylosis without myelopathy or radiculopathy, lumbar region   Rationale for Evaluation and Treatment: Rehabilitation  THERAPY DIAG:  Chronic bilateral low back pain with right-sided sciatica  Abnormal posture  Other abnormalities of gait and mobility  ONSET DATE: 20 years of back pain/ scoliosis,  recently pain in back and continuing to bend back  SUBJECTIVE:                                                                                                                                                                                           SUBJECTIVE STATEMENT: 10/10/2023    I did not get to try to golf this weekend.  I have been doing all my stretches and with my dumbbells.  Dumbbell presses. Pt comes in clinic with R  knee wrapped with compressive sleeve and ace wraps. Pt with 4/10 tibialis anterior pain.getting slightly better today..   I brought my golfing club. . And the [employer] are better for me today.   Back is doing better but I strained my R ankle.  Today my pain in my R ankle is 2/10.  Back is  2/10.     EVAL-  My wife is [name] PT at [employer].  I am now in a job in which I have to bend over a desk.  I am 6'6"   I played basketball [address], [address] and [employer] in college.  Pain in back  is overwhelming. I have a special needs son. I have been having pain that really effects my life. I have severe scoliosis and a hx of compressed L4 L5 . I feel like the hunchback of [name].  I also swim at [facility]  and the [facility] in winter.  I have been in a new job with about 8 hours of sitting.   I do not have a standing desk. I work 40% in office and 60% at home.  I have had dry needling and it was helpful and water. 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  I use 20lb  DB to do bench press etc.   Pt with long history of back pain and lifelong dx of scoliosis  PERTINENT HISTORY:  Chronic pain syndrome, hyperlipidemia, HTN, OA DM Type 2, Pain managament. THR R 12 years ago May 2012.  Left shoulder hypermobility and chronic dislocation since basketball days. Surgery of 5th toe infection March 2003. Anxiety  PAIN:  Are you having pain? Yes: NPRS scale: at rest 3/10 and  7/10 Pain location: low back radiating into R buttock and thoracic pain at scoliotic curve Pain description: sharp constant and spasming Aggravating factors: sitting for longer than 30 min, walking for longer  than 5-8 min and begin spasm Relieving factors: Rest Household chores and carrying laundry, any walking  and unable to do any gold swing  PRECAUTIONS: None  RED FLAGS: Bowel or bladder incontinence: No and Spinal tumors: No   WEIGHT BEARING RESTRICTIONS: No  FALLS:  Has patient fallen in last 6 months? 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  PLOF: Independent  PATIENT GOALS: To be able to walk again and try to play golf , try to strengthen safely  NEXT MD VISIT: TBD  OBJECTIVE: (objective measures completed at initial evaluation unless otherwise dated)   DIAGNOSTIC FINDINGS:  Recently taken at [facility]  PATIENT SURVEYS:  FOTO 47% Predicted 56% 10-03-23  60%  SCREENING FOR RED FLAGS: Bowel or bladder incontinence: No Spinal tumors: No   COGNITION: Overall cognitive status: Within functional limits for tasks assessed     SENSATION: No numbness but sometimes tingling on top of feet and I sometimes have fasciculations  MUSCLE LENGTH: :Thomas test Right  WNL able to flatten deg; Left -32 from horizontal deg  Hamstrings: Right 74 deg; Left 69 deg  1/2 range of bridge only  POSTURE: rounded shoulders, forward head, and scoliosis with R elevated pelvis and scoliotic curve leaning to the right in sitting and standing with flexed and  rotated to Left posture  PALPATION: Pt with tightened Left QL, tightened bil hamstrings and  especially Left hip flexor.    LUMBAR ROM: P! - Pain severity  AROM eval 10-03-23  Flexion Finger tips to mid shins  Fingertips to lower 1/3 of shin  Extension -15 extension P! Unable to come to  neutral ext -15 extension P! Unable to come to neutral ext  Right lateral flexion Finger tip 1.5 inch above knee jt line P! Fingertips to 1.75 inch above knee jt line No pain  Left lateral flexion Finger tip to fibular head  Finger tip to  just below fibular head   Right rotation 50% 50%  Left rotation 50% Stands in left rotation 50% Stands in left rotation   (Blank rows = not tested)  LOWER  EXTREMITY ROM:     Active  Right eval Left eval R/L 09-28-23  Hip flexion Supine 120 Supine 100 unable to come to neutral Supine 120/supine 109  Hip extension     Hip abduction     Hip adduction     Hip internal rotation 22P!!! 12 P!!! 25/ 12 blocked bony  Hip external rotation 60 55 60/55  Knee flexion     Knee extension     Ankle dorsiflexion     Ankle plantarflexion     Ankle inversion     Ankle eversion      (Blank rows = not tested) 10-03-23  Thomas test +   left 40 from horizontal, R -5 from neutral LOWER EXTREMITY MMT:    MMT Right eval Left eval R/L 10-03-23  Hip flexion 4+ 4+ 4+/4+  Hip extension 3-/ restricted  motion unable to come to neutral  4-/3-/ restricted  motion unable to come to neutral  Hip abduction 3+ 4- 4-/4-  Hip adduction     Hip internal rotation     Hip external rotation     Knee flexion 5 4+ 5/4+  Knee extension 5 5 5/5  Ankle dorsiflexion     Ankle plantarflexion     Ankle inversion     Ankle eversion      (Blank rows = not tested) 10-10-23  flexion  R  145, L130   FUNCTIONAL TESTS:  5 times sit to stand: 17.95 sec  09-28-23  5 x STS    10.65 sec  GAIT: Distance walked: 150 feet Assistive device utilized: None Level of assistance: Complete Independence Comments: Ambulates with flexed posture  with left and rotation to left posture.  Pt with left pelvic level higher than Right and concave scoliosis curve on Right  TODAY'S TREATMENT:  [facility] Adult PT Treatment:                                                DATE: 10-10-23 Therapeutic Exercise: Talll kneeling for and elongating spine Vertical bridge 2 x 10 Stand to tall kneeling x 5 Elongation of spine from sink Dowel lat stretch with bil UE palms up x 5 Manual Therapy: STW to all TPDN muscles with decreased muscle tension  Bil UT, C- 5 to T-8 on left side, L/R subscapularis, bil QL Thoracic and lumbar grade III mobs PROM of bil UE for flex/abduction/ IR/ER Trigger Point  Dry-Needling performed     by Garen Lah Treatment instructions: Expect mild to moderate muscle soreness. S/S of pneumothorax if dry needled over a lung field, and to seek immediate medical attention should they occur. Patient verbalized understanding of these instructions and education.  Patient Consent Given: Yes Education handout provided: Previously provided Muscles treated  Bil UT, C- 5 to T-8 on left side, L/R subscapularis, bil QL Electrical stimulation performed: No Parameters: N/A Treatment response/outcome: twitch response  noted, pt noted relief  [facility] Adult PT Treatment:                                                DATE: 10-05-23  Manual Therapy: MccConnell tape of  R knee lateral to medial STW to all TPDN muscles with decreased muscle tension Thoracic and lumbar grade III mobs Trigger Point Dry-Needling performed     by Garen Lah Treatment instructions: Expect mild to moderate muscle soreness. S/S of pneumothorax if dry needled over a lung field, and to seek immediate medical attention should they occur. Patient verbalized understanding of these instructions and education.  Patient Consent Given: Yes Education handout provided: Previously provided Muscles treated  R QL  right only  T -4 to T-10  . R Lat  R UT Electrical stimulation performed: No Parameters: N/A Treatment response/outcome: twitch response noted, pt noted relief Modalites  Ice pack to R knee and moist hot pack to R QL.  Therapeutic Activity: Golfing simulation rotating for R and to left working on hip rotation Practicing Tai Chi motion with left UE serratus punch while retracting R UE for derotation of Walking across grass on non compliant surface to simulate walking on ground on golf course   [facility] Adult PT Treatment:                                                DATE: 10-03-23 Recertification FOTO  60% Therapeutic Exercise: STS at 22 inches from floor for correct seated 90/90 hip  flexion/knee flexion.  With 30# KB squat 2 x 10 Side plank on R side-lying only  2 x 10 reps Gastroc Stretch on Wall 3 reps - 30 sec hold Soleus Stretch on Wall   1 sets - 3 reps - 30 sec hold SLR with VMO concentration 2 x 10   Manual Therapy: Ankle talocrural mobs R tibialis anterior STW  Self Care: Discussed golfing and recreation limitations Need for brace for lateral motion of R knee with trunk rotation Online scoliosis education given [facility] Adult PT Treatment:                                                DATE: 09-28-23 Therapeutic Exercise: Using bil UE hiking sticks, simultaneously  reverse lunging on R and L 1 x 10 Pt fatigueing and stumbling to R with CGA x 1 PT in contact. Deep squats x 5 Back elongation while doing sink squat   Bil UE hiking sticks with alternating trunk rotation with mini lunge Deadlift ladder with 25 # KB, 45# KB and llift bar with 65#  5 x with rest and back extension between sets Sit to stand x 5   , also STS 5x in 10.86 sec Manual Therapy: STW to all TPDN muscles with decreased muscle tension Thoracic and lumbar grade III mobs Trigger Point Dry-Needling performed     by Garen Lah Treatment instructions: Expect mild to moderate muscle soreness. S/S of pneumothorax if dry needled over a lung field, and to seek immediate medical attention should they occur. Patient verbalized understanding of these  instructions and education.  Patient Consent Given: Yes Education handout provided: Previously provided Muscles treated  R QL  right only  T -4 to T-10    Electrical stimulation performed: No Parameters: N/A Treatment response/outcome: twitch response noted, pt noted relief Modalites  Ice pack to R knee and moist hot pack to R QL  Therapeutic Treatment:                                                DATE: 09-26-23 Therapeutic Exercise: Physio ball  green bridging and decompression of spine x 15 Physio ball forward lean and to the Right for  derotation with deep breaths Deadlift fo 55 # with lift bar and elevated on 18 inch boxes.  7 x  with PT CGA x 1  Manual Therapy: STW to all TPDN muscles with decreased muscle tension Trigger Point Dry-Needling performed     by Garen Lah Treatment instructions: Expect mild to moderate muscle soreness. S/S of pneumothorax if dry needled over a lung field, and to seek immediate medical attention should they occur. Patient verbalized understanding of these instructions and education.  Patient Consent Given: Yes Education handout provided: Previously provided Muscles treated  R QL  thoracic left /right T -4 to T-8   R gluteals, R piriformis Electrical stimulation performed: No Parameters: N/A Treatment response/outcome: twitch response noted, pt noted relief  Therapeutic Activity: Problems solving decreasing pain with left side and forward bending and developing sciatica.   Pt simulating problem positions and PT offering derotation suggestions to lengthen spine and utilize sciatic nerve gliding  Self Care: Discussed online accountability and scoliosis specific group exercise and resources Use of skeleton for scoliosis education   [facility] Adult PT Treatment:                                                DATE: 09-21-23 Therapeutic Exercise: Standing on 4 inch step in door way with Left LE hanging and trying to elongate spine on door frame swinging left LE Same position,  hip extension x 10 maximally (just past neutral for pt) Standing on 4 inch step in door way with right  LE hanging and trying to elongate spine on door frame swinging le right LE Same position,  hip extension x 10 maximally Using bil UE hiking sticks, simultaneously lunging on R and L 2 x 10 Bil UE hiking sticks with alternating trunk rotation with mini lunge Manual Therapy: STW to all TPDN muscles with decreased muscle tension Trigger Point Dry-Needling performed     by Garen Lah Treatment instructions: Expect  mild to moderate muscle soreness. S/S of pneumothorax if dry needled over a lung field, and to seek immediate medical attention should they occur. Patient verbalized understanding of these instructions and education.  Patient Consent Given: Yes Education handout provided: Previously provided Muscles treated  left QL  thoracic left /right T -4 to T-8 , Left / right UT, Left infraspinatus  bil QL   R gluteals Electrical stimulation performed: No Parameters: N/A Treatment response/outcome: twitch response noted, pt noted relief  [facility] Adult PT Treatment:  DATE: 09/18/23 Therapeutic Exercise: Hooklying breathing 3 sets of 5 breaths with emphasis on L ribcage expansion with tactile cues, deep breathing for muscular relaxation, education on pacing and normal breathing during rest breaks  7# unilat row CC RUE with rotation to R, CGA with staggered stance, cues for appropriate ROM/mechanics 3# punches CC LUE with rotation to R, cues for follow through and CGA given staggered stance L hip flexor isometric push 2x8 with UE support on wall  Standing L hip flexor stretch + contralateral trunk rotation x5; use of treadmill for LUE support, cueing for pelvic positioning and posture, head/neck positioning HEP update + education and provision of resistance bands, continued education/discussion on relevant anatomy/physiology as it pertains to pain and rationale for interventions    [facility] Adult PT Treatment:                                                DATE: 09/07/23 Therapeutic Exercise: Banded scaption red band around wrists, 2x8; emphasis on posture, and consistent tension Standing triple flexion/extension LLE only, RUE support on counter 2x8 cues for breath control and posture CC R rotational row 10# x8 with LUE support (ski pole) heavy tactile cues to reduce compensations CC R rotational row 7# x8 with L UE support Continued education/discussion re: HEP and  appropriate exercise   [facility] Adult PT Treatment:                                                DATE: 09/05/23 Therapeutic Exercise: Suitcase carry 15# 6x46ft cues for upright posture, RUE only  Seated high>low row CC 10# x15 BIL (performed unilaterally) Seated high>low row with blue band unilat, x8 BIL education for home setup Standing L hip flexor stretch + contralateral trunk rotation x10; use of treadmill for LUE support, cueing for pelvic positioning Standing L hip flexor stretch + ipsilateral UE reach x10; use of treadmill for RUE support, cueing for pelvic positioning Continuing to spend time with education/discussion re: relevant anatomy/physiology, exercise tolerance, rationale for interventions    [facility] Adult PT Treatment:                                                DATE: 08/31/23 Therapeutic Exercise: LTR x10  Open book x10 R side only (L sidelying) L QL stretch w/ physioball fulcrum, 2x8 CGA given mild instability during lean, increased time for setup/performance, cues for appropriate ball height at hip Standing L hip flexor stretch + contralateral trunk rotation 2x10; use of treadmill for LUE support, cueing for pelvic positioning Significant time spent w/ education/discussion re: pt exercise outside of session, relevant anatomy/physiology as it pertains to exercise, and rationale for interventions    [facility] Adult PT Treatment:                                                DATE: 08-28-23 Therapeutic Exercise: Left hip flexor stretch 3 x 30 sec hold Supine LTR Standing on  box and elongating Left side while holding onto door frame Manual Therapy: STW to all TPDN muscles with decreased muscle tension Trigger Point Dry-Needling performed     by Garen Lah Treatment instructions: Expect mild to moderate muscle soreness. S/S of pneumothorax if dry needled over a lung field, and to seek immediate medical attention should they occur. Patient verbalized understanding of these  instructions and education.  Patient Consent Given: Yes Education handout provided: Previously provided Muscles treated  left QL  thoracic left T -4 to T-8 , Left UT, Left infraspinatus Electrical stimulation performed: No Parameters: N/A Treatment response/outcome: twitch response noted, pt noted relief  Self Care: Educaton on curve and scoliosis and rotational curve                                                                                                                              DATE: 08-24-23  Eval    PATIENT EDUCATION:  Education details: rationale for interventions Person educated: Patient Education method: Explanation, Demonstration, Tactile cues, and Verbal cues Education comprehension: verbalized understanding, returned demonstration, verbal cues required, and tactile cues required  HOME EXERCISE PROGRAM:  Access Code: [email or web address] URL: [email or web address] Date: 09/18/2023 Prepared by: Fransisco Hertz  Program Notes - with lat pull down, please keep elbows close to body, and do one arm at a time  Exercises - Seated Lat Pull Down with Resistance - Elbows Bent  - 2-3 x daily - 7 x weekly - 1 sets - 8 reps - Standing Single Arm Low Row with Anchored Resistance  - 2-3 x daily - 7 x weekly - 1 sets - 8 reps - Single Arm Punch with Resistance  - 2-3 x daily - 7 x weekly - 1 sets - 8 reps Added to HEP 09-26-23 Program Notes - with lat pull down, please keep elbows close to body, and do one arm at a timePhysio ball forward and to the right push forward. - [place] Deadlift With Barbell  - 1 x daily - 7 x weekly - 3 sets - 10 reps - Supine Bridge with Heels on Swiss Ball and Knees Bent  - 1 x daily - 7 x weekly - 2 sets - 10 reps Added 10-03-23 Goblet Squat with Kettlebell  - 1 x daily - 7 x weekly - 3 sets - 10 reps - Side plank on R side-lying only  - 1 x daily - 7 x weekly - 3 sets - 10 reps - Gastroc Stretch on Wall  - 1 x daily - 7 x weekly - 1 sets - 3  reps - 30 sec hold - Soleus Stretch on Wall  - 1 x daily - 7 x weekly - 1 sets - 3 reps - 30 sec hold  10-05-23 - Supine Sciatic Nerve Glide  - 1 x daily - 7 x weekly - 1 sets - 10 rep ASSESSMENT:  CLINICAL IMPRESSION: 10/10/2023 Pt arrived w/ report Back 5/10 pain.  And tibialis anterior 4/10  Pt reports that mc Connell tape not as effective and [employer] brace did not fit well and slid down leg.  Pt may benefit from a customized knee brace to consider going to golf on a regular basis. . Pt consented to TPDN and was closely monitored throughout session.  Pt with TPDN to bil UE/lats/subscapularis with increased R and L flexion of shoulder R 145 and L 130 d.  Pt with decrease muscle tension and reports feeling taller after session.  Pt was able to perform standing to tall kneeling exercise with increased comfort.   Pt departs today's session in no acute distress, all voiced questions/concerns addressed appropriately from PT perspective.  Pt has 4 appt left to maximize strength and transition to online and community wellness.     EVAL- Patient is a 52 y.o. male who was seen today for physical therapy evaluation and treatment for spondylosis  and moderate scoliosis with flexed posture and scoliosis rotated to left with convex on Right. Including R sided sciatic pain.  Pt has has long history of back pain and lifetime dx of scoliosis.  He has thoracic.lumbar spasming into R buttock.  Pt with flexibility issues contributing to pain.He has a job In which he sits and works on [employer] also contributing to his over flexed posture. Pt would benefit form standing desk and HEP/helps to decrease seated time. Pt will benefit from skilled PT to reduce pain and increase strength and flexibility to return to as neutral as possible. Pt needs to care for special needs son. He is limited due to pain in daily sedentary job and with sleep at night. He desires to return to a more active lifestyle playing racquet sports and  golf as able but his sedentary job adds to his flexed and scoliotic posture. Will continue to maximize AROM and strength as able to add more varied leisure activities     OBJECTIVE IMPAIRMENTS: decreased activity tolerance, decreased knowledge of condition, difficulty walking, decreased ROM, decreased strength, increased fascial restrictions, improper body mechanics, postural dysfunction, and obesity.   ACTIVITY LIMITATIONS: carrying, bending, sitting, standing, squatting, sleeping, stairs, locomotion level, and stairs  PARTICIPATION LIMITATIONS: driving, community activity, occupation, and caring for special needs son  PERSONAL FACTORS: Chronic pain syndrome, hyperlipidemia, HTN, OA DM Type 2, Pain managament. THR R 12 years ago May 2012.  Left shoulder hypermobility and chronic dislocation since basketball days. Surgery of 5th toe infection March 2003. Anxiety are also affecting patient's functional outcome.   REHAB POTENTIAL: Good  CLINICAL DECISION MAKING: Evolving/moderate complexity  EVALUATION COMPLEXITY: Moderate   GOALS: Goals reviewed with patient? Yes  SHORT TERM GOALS: Target date: 09-14-23  Pt will be independent with initial HEP Baseline:no knowledge Goal status:MET  2.  Pt will work on sleep positions to increase more comfort in order to have at least 5 or more hours of restorative sleep Baseline:  tosses and turns at night and has increased pain Goal status:MET  3.  Demonstrate understanding of  as neutral posture as possible with scoliosis and be more conscious of position and posture throughout the day.  Baseline: no knowledge Goal status: MET  4.  Pain with going up and down steps with maximum safety Baseline: Pt descends steps backwards due to size 16 shoe 09-28-23  Pt continues to descend backwards for safety due to large sixe 16 feet and safety Goal status:MET   LONG TERM GOALS: Target date: 10-05-23 revised  11-16-23  Pt will be independent with  advanced HEP using gym equipment and join gym upon DC Baseline: is not a part of gym Goal status: ONGOING  2.  Pt will be able to walk for  25 minutes using pain modification with position and stretching Baseline: Can only walk for 5-8 minutes 10-03-23  10 min  Goal status: ONGOING  3.  Pt will be able to pick up 30 to 45# KB with proper form for exercise Baseline:  09-26-23  working on 55 # lift bar Goal status: ONGOING  4.  Pt will be able to improve flexibility in order to try to begin racquet sports /golf as able.  Baseline: unable to participate now 10-03-23  Close to being able to make a full swing with golf club Goal status:ONGOING  5.  Pain with activity will decrease  to 3/10 instead of 7/10 Baseline: At worse with functional activity 7/10 10-03-23  Pt pain at 4/10 today  Goal status: ONGOING  6.  Pt will simulate golfing/ racquet sports and add strengthening/flexibility exercises that will enhance ability Baseline: Unable to  play sports at present due to spasming 10-03-23  Pt with difficulty turning neck while using gold club.  Close to doing a swing. Goal status: ONGOING  7. Pt will investigate bracing for R knee in order to protect knee for trunk and rotational dynamic lunging exercises  Baseline,  R knee with patellar and lateral movement  Goal: ONGOING   PLAN:  PT FREQUENCY: 1-2x/week  PT DURATION: 6 weeks  PLANNED INTERVENTIONS: Therapeutic exercises, Therapeutic activity, Neuromuscular re-education, Balance training, Gait training, Patient/Family education, Self Care, Joint mobilization, Stair training, Dry Needling, Electrical stimulation, Spinal mobilization, Cryotherapy, Moist heat, Taping, Ionotophoresis 4mg /ml Dexamethasone, Manual therapy, and Re-evaluation.  PLAN FOR NEXT SESSION: review/update HEP PRN. Continue with multiplanar rotational activities, hip flexor mobility, working into postural extension.    Work on opening up Left hip. May need a  customized knee brace on Right  Garen Lah, PT, [facility] Certified Exercise Expert for the Aging Adult  10/10/23 4:01 PM Phone: [phone] Fax: [phone]

## 2023-10-17 ENCOUNTER — Other Ambulatory Visit (HOSPITAL_COMMUNITY): Payer: Self-pay

## 2023-10-18 ENCOUNTER — Ambulatory Visit: Payer: 59 | Admitting: Physical Therapy

## 2023-10-18 ENCOUNTER — Other Ambulatory Visit (HOSPITAL_COMMUNITY): Payer: Self-pay

## 2023-10-18 MED ORDER — OZEMPIC (0.25 OR 0.5 MG/DOSE) 2 MG/3ML ~~LOC~~ SOPN
0.2500 mg | PEN_INJECTOR | SUBCUTANEOUS | 5 refills | Status: AC
  Filled 2023-10-18: qty 3, 28d supply, fill #0
  Filled 2023-12-19 (×2): qty 3, 56d supply, fill #1
  Filled 2024-02-26: qty 3, 56d supply, fill #2
  Filled 2024-04-18: qty 3, 56d supply, fill #3
  Filled 2024-04-24: qty 3, 28d supply, fill #3
  Filled 2024-04-25 – 2024-04-26 (×2): qty 3, 56d supply, fill #3
  Filled 2024-06-06 – 2024-06-12 (×3): qty 3, 56d supply, fill #4
  Filled 2024-07-11: qty 3, 56d supply, fill #5

## 2023-10-19 ENCOUNTER — Other Ambulatory Visit (HOSPITAL_COMMUNITY): Payer: Self-pay

## 2023-10-19 MED ORDER — LOSARTAN POTASSIUM 25 MG PO TABS
25.0000 mg | ORAL_TABLET | Freq: Every day | ORAL | 1 refills | Status: DC
  Filled 2023-10-19: qty 90, 90d supply, fill #0
  Filled 2024-04-03: qty 90, 90d supply, fill #1

## 2023-10-23 DIAGNOSIS — E875 Hyperkalemia: Secondary | ICD-10-CM | POA: Diagnosis not present

## 2023-10-23 DIAGNOSIS — F1120 Opioid dependence, uncomplicated: Secondary | ICD-10-CM | POA: Diagnosis not present

## 2023-10-24 ENCOUNTER — Other Ambulatory Visit (HOSPITAL_COMMUNITY): Payer: Self-pay

## 2023-10-24 MED ORDER — BUPRENORPHINE HCL-NALOXONE HCL 8-2 MG SL FILM
1.0000 | ORAL_FILM | Freq: Two times a day (BID) | SUBLINGUAL | 0 refills | Status: DC
Start: 2023-10-25 — End: 2023-11-24
  Filled 2023-10-25: qty 55, 30d supply, fill #0

## 2023-10-25 ENCOUNTER — Other Ambulatory Visit (HOSPITAL_COMMUNITY): Payer: Self-pay

## 2023-10-25 MED ORDER — OZEMPIC (1 MG/DOSE) 4 MG/3ML ~~LOC~~ SOPN
1.0000 mg | PEN_INJECTOR | SUBCUTANEOUS | 5 refills | Status: DC
  Filled 2023-10-25 – 2023-11-20 (×2): qty 3, 28d supply, fill #0

## 2023-10-26 ENCOUNTER — Telehealth: Payer: Self-pay | Admitting: Physical Therapy

## 2023-10-26 ENCOUNTER — Ambulatory Visit: Payer: 59 | Admitting: Physical Therapy

## 2023-10-26 NOTE — Therapy (Signed)
OUTPATIENT PHYSICAL THERAPY TREATMENT NOTE   Patient Name: Brandon Gordon MRN: [id number] DOB:[birth year], 51 y.o., male Today's Date: 10/31/2023  END OF SESSION:  PT End of Session - 10/31/23 1506     Visit Number 13    Number of Visits 16    Date for PT Re-Evaluation 11/16/23    Authorization Type [place] [employer]    PT Start Time 1502    PT Stop Time 1545    PT Time Calculation (min) 43 min    Activity Tolerance Patient tolerated treatment well;Patient limited by pain    Behavior During Therapy [facility] for tasks assessed/performed                       Past Medical History:  Diagnosis Date   Anxiety    At risk for sleep apnea    STOP-BANG= 5      SENT TO PCP 04-05-2016   Chronic pain syndrome    History of kidney stones    Hyperlipidemia    Hypertension    Left ureteral stone    OA (osteoarthritis)    back, hip   Pain management    Type 2 diabetes mellitus (HCC)    Past Surgical History:  Procedure Laterality Date   TOTAL HIP ARTHROPLASTY Right 04-22-2011   Patient Active Problem List   Diagnosis Date Noted   Unilateral primary osteoarthritis, left hip 10/30/2023    PCP: Noberto Retort, MD   REFERRING PROVIDER: Bedelia Person, MD   REFERRING DIAG: [id number] (ICD-10-CM) - Spondylosis without myelopathy or radiculopathy, lumbar region   Rationale for Evaluation and Treatment: Rehabilitation  THERAPY DIAG:  Chronic bilateral low back pain with right-sided sciatica  Abnormal posture  Other abnormalities of gait and mobility  ONSET DATE: 20 years of back pain/ scoliosis,  recently pain in back and continuing to bend back  SUBJECTIVE:                                                                                                                                                                                           SUBJECTIVE STATEMENT: 10/31/2023  I went to the Dr Magnus Ivan and had my Left hip examined to get a L THR due to severe OA.   I am a 5/10 in my hip  but I am feeling stronger. I am on a waiting list      EVAL-  My wife is [name] PT at [employer].  I am now in a job in which I have to bend over a desk.  I am 6'6"   I played basketball [address], [address]  and [employer] in college.  Pain in back  is overwhelming. I have a special needs son. I have been having pain that really effects my life. I have severe scoliosis and a hx of compressed L4 L5 . I feel like the hunchback of [facility].  I also swim at [facility]  and the [facility] in winter.  I have been in a new job with about 8 hours of sitting.   I do not have a standing desk. I work 40% in office and 60% at home.  I have had dry needling and it was helpful and water. 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  I use 20lb  DB to do bench press etc.   Pt with long history of back pain and lifelong dx of scoliosis  PERTINENT HISTORY:  Chronic pain syndrome, hyperlipidemia, HTN, OA DM Type 2, Pain managament. THR R 12 years ago May 2012.  Left shoulder hypermobility and chronic dislocation since basketball days. Surgery of 5th toe infection March 2003. Anxiety  PAIN:  Are you having pain? Yes: NPRS scale: at rest 3/10 and  7/10 Pain location: low back radiating into R buttock and thoracic pain at scoliotic curve Pain description: sharp constant and spasming Aggravating factors: sitting for longer than 30 min, walking for longer  than 5-8 min and begin spasm Relieving factors: Rest Household chores and carrying laundry, any walking  and unable to do any gold swing  PRECAUTIONS: None  RED FLAGS: Bowel or bladder incontinence: No and Spinal tumors: No   WEIGHT BEARING RESTRICTIONS: No  FALLS:  Has patient fallen in last 6 months? 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  PLOF: Independent  PATIENT GOALS: To be able to walk again and try to play golf , try to strengthen safely  NEXT MD VISIT: TBD  OBJECTIVE: (objective measures completed at initial evaluation unless otherwise dated)   DIAGNOSTIC FINDINGS:  Recently taken at [facility]  PATIENT SURVEYS:  FOTO 47% Predicted 56% 10-03-23  60%  SCREENING FOR RED FLAGS: Bowel or bladder incontinence: No Spinal tumors: No   COGNITION: Overall cognitive status: Within functional limits for tasks assessed     SENSATION: No numbness but sometimes tingling on top of feet and I sometimes have fasciculations  MUSCLE LENGTH: :Thomas test Right  WNL able to flatten deg; Left -32 from horizontal deg  Hamstrings: Right 74 deg; Left 69 deg  1/2 range of bridge only  POSTURE: rounded shoulders, forward head, and scoliosis with R elevated pelvis and scoliotic curve leaning to the right in sitting and standing with flexed and  rotated to Left posture  PALPATION: Pt with tightened Left QL, tightened bil hamstrings and  especially Left hip flexor.    LUMBAR ROM: P! - Pain severity  AROM eval 10-03-23  Flexion Finger tips to mid shins  Fingertips to lower 1/3 of shin  Extension -15 extension P! Unable to come to neutral ext -15 extension P! Unable to come to neutral ext  Right lateral flexion Finger tip 1.5 inch above knee jt line P! Fingertips to 1.75 inch above knee jt line No pain  Left lateral flexion Finger tip to  fibular head  Finger tip to  just below fibular head   Right rotation 50% 50%  Left rotation 50% Stands in left rotation 50% Stands in left rotation   (Blank rows = not tested)  LOWER EXTREMITY ROM:     Active  Right eval Left eval R/L 09-28-23  Hip flexion Supine 120 Supine 100 unable to come to neutral Supine 120/supine 109  Hip extension     Hip abduction     Hip adduction     Hip internal  rotation 22P!!! 12 P!!! 25/ 12 blocked bony  Hip external rotation 60 55 60/55  Knee flexion     Knee extension     Ankle dorsiflexion     Ankle plantarflexion     Ankle inversion     Ankle eversion      (Blank rows = not tested) 10-03-23  Thomas test +   left 40 from horizontal, R -5 from neutral LOWER EXTREMITY MMT:    MMT Right eval Left eval R/L 10-03-23  Hip flexion 4+ 4+ 4+/4+  Hip extension 3-/ restricted  motion unable to come to neutral  4-/3-/ restricted  motion unable to come to neutral  Hip abduction 3+ 4- 4-/4-  Hip adduction     Hip internal rotation     Hip external rotation     Knee flexion 5 4+ 5/4+  Knee extension 5 5 5/5  Ankle dorsiflexion     Ankle plantarflexion     Ankle inversion     Ankle eversion      (Blank rows = not tested) 10-10-23  flexion  R  145, L130   FUNCTIONAL TESTS:  5 times sit to stand: 17.95 sec  09-28-23  5 x STS    10.65 sec  GAIT: Distance walked: 150 feet Assistive device utilized: None Level of assistance: Complete Independence Comments: Ambulates with flexed posture  with left and rotation to left posture.  Pt with left pelvic level higher than Right and concave scoliosis curve on Right  TODAY'S TREATMENT:  [facility] Adult PT Treatment:                                                DATE: 10-31-23 Therapeutic Exercise: Marjo Bicker pose forward and to Right and Left Quadriped Thread the needle Squat and back extension using counter Manual Therapy: STW to all TPDN muscles with decreased muscle tension  Bil UT, C- 5 to T-8 in prone, Left rectus femoris Thoracic and lumbar grade III mobs LAD of L LE Trigger Point Dry-Needling performed     by Garen Lah Treatment instructions: Expect mild to moderate muscle soreness. S/S of pneumothorax if dry needled over a lung field, and to seek immediate medical attention should they occur. Patient verbalized understanding of these instructions and education.  Patient Consent Given:  Yes Education handout provided: Previously provided Muscles treated  Bil UT, C- 5 to T-8 in prone, Left rectus femoris Electrical stimulation performed: No Parameters: N/A Treatment response/outcome: twitch response noted, pt noted relief  Self Care: Posture and body mechanic handout LIfting mechanics     [facility] Adult PT Treatment:  DATE: 10-10-23 Therapeutic Exercise: Talll kneeling for and elongating spine Vertical bridge 2 x 10 Stand to tall kneeling x 5 Elongation of spine from sink Dowel lat stretch with bil UE palms up x 5 Manual Therapy: STW to all TPDN muscles with decreased muscle tension  Bil UT, C- 5 to T-8 on left side, L/R subscapularis, bil QL Thoracic and lumbar grade III mobs PROM of bil UE for flex/abduction/ IR/ER Trigger Point Dry-Needling performed     by Garen Lah Treatment instructions: Expect mild to moderate muscle soreness. S/S of pneumothorax if dry needled over a lung field, and to seek immediate medical attention should they occur. Patient verbalized understanding of these instructions and education.  Patient Consent Given: Yes Education handout provided: Previously provided Muscles treated  Bil UT, C- 5 to T-8 on left side, L/R subscapularis, bil QL Electrical stimulation performed: No Parameters: N/A Treatment response/outcome: twitch response noted, pt noted relief  [facility] Adult PT Treatment:                                                DATE: 10-05-23  Manual Therapy: MccConnell tape of  R knee lateral to medial STW to all TPDN muscles with decreased muscle tension Thoracic and lumbar grade III mobs Trigger Point Dry-Needling performed     by Garen Lah Treatment instructions: Expect mild to moderate muscle soreness. S/S of pneumothorax if dry needled over a lung field, and to seek immediate medical attention should they occur. Patient verbalized understanding of these instructions and  education.  Patient Consent Given: Yes Education handout provided: Previously provided Muscles treated  R QL  right only  T -4 to T-10  . R Lat  R UT Electrical stimulation performed: No Parameters: N/A Treatment response/outcome: twitch response noted, pt noted relief Modalites  Ice pack to R knee and moist hot pack to R QL.  Therapeutic Activity: Golfing simulation rotating for R and to left working on hip rotation Practicing Tai Chi motion with left UE serratus punch while retracting R UE for derotation of Walking across grass on non compliant surface to simulate walking on ground on golf course   PATIENT EDUCATION:  Education details: rationale for interventions Person educated: Patient Education method: Explanation, Demonstration, Tactile cues, and Verbal cues Education comprehension: verbalized understanding, returned demonstration, verbal cues required, and tactile cues required  HOME EXERCISE PROGRAM:  Access Code: [email or web address] URL: [email or web address] Date: 09/18/2023 Prepared by: Fransisco Hertz  Program Notes - with lat pull down, please keep elbows close to body, and do one arm at a time  Exercises - Seated Lat Pull Down with Resistance - Elbows Bent  - 2-3 x daily - 7 x weekly - 1 sets - 8 reps - Standing Single Arm Low Row with Anchored Resistance  - 2-3 x daily - 7 x weekly - 1 sets - 8 reps - Single Arm Punch with Resistance  - 2-3 x daily - 7 x weekly - 1 sets - 8 reps Added to HEP 09-26-23 Program Notes - with lat pull down, please keep elbows close to body, and do one arm at a timePhysio ball forward and to the right push forward. - [place] Deadlift With Barbell  - 1 x daily - 7 x weekly - 3 sets - 10 reps - Supine Bridge with Heels on [employer] and [employer]  -  1 x daily - 7 x weekly - 2 sets - 10 reps Added 10-03-23 Goblet Squat with Kettlebell  - 1 x daily - 7 x weekly - 3 sets - 10 reps - Side plank on R side-lying only  - 1 x daily - 7 x  weekly - 3 sets - 10 reps - Gastroc Stretch on Wall  - 1 x daily - 7 x weekly - 1 sets - 3 reps - 30 sec hold - Soleus Stretch on Wall  - 1 x daily - 7 x weekly - 1 sets - 3 reps - 30 sec hold  10-05-23 - Supine Sciatic Nerve Glide  - 1 x daily - 7 x weekly - 1 sets - 10 rep ASSESSMENT:  CLINICAL IMPRESSION: 10/31/2023 Pt arrived with 5/10 back and hip pain and will be on waiting list to schedule for L THR.  Pt states he is feeling stronger and was able to function and tolerate prone position which has been difficult since evaluation. Pt consented to TPDN and was closely monitored throughout session.  Pt with TPDN to bil UT down to back and to Left rectus femoris   Pt with decrease muscle tension and reports feeling taller after session and stronger.    Pt departs today's session in no acute distress, all voiced questions/concerns addressed appropriately from PT perspective.  Pt has 4 appt left to maximize strength and transition to online and community wellness.     EVAL- Patient is a 52 y.o. male who was seen today for physical therapy evaluation and treatment for spondylosis  and moderate scoliosis with flexed posture and scoliosis rotated to left with convex on Right. Including R sided sciatic pain.  Pt has has long history of back pain and lifetime dx of scoliosis.  He has thoracic.lumbar spasming into R buttock.  Pt with flexibility issues contributing to pain.He has a job In which he sits and works on [employer] also contributing to his over flexed posture. Pt would benefit form standing desk and HEP/helps to decrease seated time. Pt will benefit from skilled PT to reduce pain and increase strength and flexibility to return to as neutral as possible. Pt needs to care for special needs son. He is limited due to pain in daily sedentary job and with sleep at night. He desires to return to a more active lifestyle playing racquet sports and golf as able but his sedentary job adds to his flexed and  scoliotic posture. Will continue to maximize AROM and strength as able to add more varied leisure activities     OBJECTIVE IMPAIRMENTS: decreased activity tolerance, decreased knowledge of condition, difficulty walking, decreased ROM, decreased strength, increased fascial restrictions, improper body mechanics, postural dysfunction, and obesity.   ACTIVITY LIMITATIONS: carrying, bending, sitting, standing, squatting, sleeping, stairs, locomotion level, and stairs  PARTICIPATION LIMITATIONS: driving, community activity, occupation, and caring for special needs son  PERSONAL FACTORS: Chronic pain syndrome, hyperlipidemia, HTN, OA DM Type 2, Pain managament. THR R 12 years ago May 2012.  Left shoulder hypermobility and chronic dislocation since basketball days. Surgery of 5th toe infection March 2003. Anxiety are also affecting patient's functional outcome.   REHAB POTENTIAL: Good  CLINICAL DECISION MAKING: Evolving/moderate complexity  EVALUATION COMPLEXITY: Moderate   GOALS: Goals reviewed with patient? Yes  SHORT TERM GOALS: Target date: 09-14-23  Pt will be independent with initial HEP Baseline:no knowledge Goal status:MET  2.  Pt will work on sleep positions to increase more comfort in order to have  at least 5 or more hours of restorative sleep Baseline:  tosses and turns at night and has increased pain Goal status:MET  3.  Demonstrate understanding of  as neutral posture as possible with scoliosis and be more conscious of position and posture throughout the day.  Baseline: no knowledge Goal status: MET  4.  Pain with going up and down steps with maximum safety Baseline: Pt descends steps backwards due to size 16 shoe 09-28-23  Pt continues to descend backwards for safety due to large sixe 16 feet and safety Goal status:MET   LONG TERM GOALS: Target date: 10-05-23 revised  11-16-23  Pt will be independent with advanced HEP using gym equipment and join gym upon DC Baseline:  is not a part of gym Goal status: ONGOING  2.  Pt will be able to walk for  25 minutes using pain modification with position and stretching Baseline: Can only walk for 5-8 minutes 10-03-23  10 min  Goal status: ONGOING  3.  Pt will be able to pick up 30 to 45# KB with proper form for exercise Baseline:  09-26-23  working on 55 # lift bar Goal status: ONGOING  4.  Pt will be able to improve flexibility in order to try to begin racquet sports /golf as able.  Baseline: unable to participate now 10-03-23  Close to being able to make a full swing with golf club Goal status:ONGOING  5.  Pain with activity will decrease  to 3/10 instead of 7/10 Baseline: At worse with functional activity 7/10 10-03-23  Pt pain at 4/10 today  Goal status: ONGOING  6.  Pt will simulate golfing/ racquet sports and add strengthening/flexibility exercises that will enhance ability Baseline: Unable to  play sports at present due to spasming 10-03-23  Pt with difficulty turning neck while using gold club.  Close to doing a swing. Goal status: ONGOING  7. Pt will investigate bracing for R knee in order to protect knee for trunk and rotational dynamic lunging exercises  Baseline,  R knee with patellar and lateral movement  Goal: ONGOING   PLAN:  PT FREQUENCY: 1-2x/week  PT DURATION: 6 weeks  PLANNED INTERVENTIONS: Therapeutic exercises, Therapeutic activity, Neuromuscular re-education, Balance training, Gait training, Patient/Family education, Self Care, Joint mobilization, Stair training, Dry Needling, Electrical stimulation, Spinal mobilization, Cryotherapy, Moist heat, Taping, Ionotophoresis 4mg /ml Dexamethasone, Manual therapy, and Re-evaluation.  PLAN FOR NEXT SESSION: review/update HEP PRN. Continue with multiplanar rotational activities, hip flexor mobility, working into postural extension.    Work on opening up Left hip. May need a customized knee brace on Right Must do 'GOALS next visit    Garen Lah, PT, [facility] Certified Exercise Expert for the Aging Adult  10/31/23 4:47 PM Phone: [phone] Fax: [phone]

## 2023-10-26 NOTE — Telephone Encounter (Signed)
Called pt re: this afternoon's missed appointment - left voicemail with confirmation of next appt and office call back number for scheduling needs

## 2023-10-30 ENCOUNTER — Encounter: Payer: Self-pay | Admitting: Orthopaedic Surgery

## 2023-10-30 ENCOUNTER — Other Ambulatory Visit (INDEPENDENT_AMBULATORY_CARE_PROVIDER_SITE_OTHER): Payer: 59

## 2023-10-30 ENCOUNTER — Ambulatory Visit (INDEPENDENT_AMBULATORY_CARE_PROVIDER_SITE_OTHER): Payer: Self-pay | Admitting: Orthopaedic Surgery

## 2023-10-30 DIAGNOSIS — Z96641 Presence of right artificial hip joint: Secondary | ICD-10-CM

## 2023-10-30 DIAGNOSIS — M25552 Pain in left hip: Secondary | ICD-10-CM | POA: Diagnosis not present

## 2023-10-30 DIAGNOSIS — E291 Testicular hypofunction: Secondary | ICD-10-CM | POA: Diagnosis not present

## 2023-10-30 DIAGNOSIS — M1612 Unilateral primary osteoarthritis, left hip: Secondary | ICD-10-CM | POA: Diagnosis not present

## 2023-10-30 NOTE — Progress Notes (Signed)
The patient is a very pleasant 52 year old gentleman who comes in with debilitating left hip pain and significant low back pain as a relates to dealing with worsening left hip pain over the years.  He does have severe scoliosis as well and he is in physical therapy for his spine.  We actually replaced his right hip back in 2012 due to severe arthritis in that hip is done well.  He is developed severe left hip groin pain for well over a year and 2 years now and has now developed a slight flexion contracture of that left hip.  His wife is a physical therapist and we have her on the phone today as well.  He has been working with outpatient physical therapy but his wife works with him as well.  I was able to review all of his notes within epic.  He has lost about 70 pounds over the last several years but has gained some.  He is 6 foot 6 tall.  He is in chronic pain management as well.  He currently denies any headache, chest pain, shortness of breath, fever, chills, nausea, vomiting.  On exam his right operative hip moves smoothly and fluidly.  The left hip has significant limitations with internal and external rotation.  When I have him lay flat he does have a slight flexion contracture of that left hip but we were able to slowly push him down and get it straighter.  An AP pelvis and lateral left hip shows severe and stage arthritis of the left hip with bone-on-bone wear, osteophytes all around the lateral femoral head and neck as well as posterior and a large osteophyte off of the acetabulum.  There is also flattening of the femoral head.  He does have severe end-stage arthritis of his left hip and would benefit from a total hip arthroplasty at this standpoint.  I believe this would help his posture is significantly and can ease some of the pain with his back.  Having had this before he is fully aware what the surgery involves.  We discussed in detail the risk and benefits of the surgery and what to expect from  an intraoperative and postoperative course again.  Will work on getting this scheduled hopefully in the near future but he knows that they may not be till after the first of the year based on our schedule and where we are in the right now.  He knows we will reach out and be in touch.

## 2023-10-31 ENCOUNTER — Encounter: Payer: Self-pay | Admitting: Physical Therapy

## 2023-10-31 ENCOUNTER — Ambulatory Visit: Payer: 59 | Attending: Neurosurgery | Admitting: Physical Therapy

## 2023-10-31 DIAGNOSIS — R293 Abnormal posture: Secondary | ICD-10-CM

## 2023-10-31 DIAGNOSIS — M5441 Lumbago with sciatica, right side: Secondary | ICD-10-CM | POA: Insufficient documentation

## 2023-10-31 DIAGNOSIS — R2689 Other abnormalities of gait and mobility: Secondary | ICD-10-CM | POA: Diagnosis not present

## 2023-10-31 DIAGNOSIS — G8929 Other chronic pain: Secondary | ICD-10-CM | POA: Diagnosis not present

## 2023-10-31 NOTE — Patient Instructions (Signed)
Sleeping on Back  Place pillow under knees. A pillow with cervical support and a roll around waist are also helpful. Copyright  VHI. All rights reserved.  Sleeping on Side Place pillow between knees. Use cervical support under neck and a roll around waist as needed. Copyright  VHI. All rights reserved.   Sleeping on Stomach   If this is the only desirable sleeping position, place pillow under lower legs, and under stomach or chest as needed.  Posture - Sitting   Sit upright, head facing forward. Try using a roll to support lower back. Keep shoulders relaxed, and avoid rounded back. Keep hips level with knees. Avoid crossing legs for long periods. Stand to Sit / Sit to Stand   To sit: Bend knees to lower self onto front edge of chair, then scoot back on seat. To stand: Reverse sequence by placing one foot forward, and scoot to front of seat. Use rocking motion to stand up.   Work Height and Reach  Ideal work height is no more than 2 to 4 inches below elbow level when standing, and at elbow level when sitting. Reaching should be limited to arm's length, with elbows slightly bent.  Bending  Bend at hips and knees, not back. Keep feet shoulder-width apart.    Posture - Standing   Good posture is important. Avoid slouching and forward head thrust. Maintain curve in low back and align ears over shoul- ders, hips over ankles.  Alternating Positions   Alternate tasks and change positions frequently to reduce fatigue and muscle tension. Take rest breaks. Computer Work   Position work to Art gallery manager. Use proper work and seat height. Keep shoulders back and down, wrists straight, and elbows at right angles. Use chair that provides full back support. Add footrest and lumbar roll as needed.  Getting Into / Out of Car  Lower self onto seat, scoot back, then bring in one leg at a time. Reverse sequence to get out.  Dressing  Lie on back to pull socks or slacks over feet, or sit  and bend leg while keeping back straight.    Housework - Sink  Place one foot on ledge of cabinet under sink when standing at sink for prolonged periods.   Pushing / Pulling  Pushing is preferable to pulling. Keep back in proper alignment, and use leg muscles to do the work.  Deep Squat   Squat and lift with both arms held against upper trunk. Tighten stomach muscles without holding breath. Use smooth movements to avoid jerking.  Avoid Twisting   Avoid twisting or bending back. Pivot around using foot movements, and bend at knees if needed when reaching for articles.  Carrying Luggage   Distribute weight evenly on both sides. Use a cart whenever possible. Do not twist trunk. Move body as a unit.   Lifting Principles Maintain proper posture and head alignment. Slide object as close as possible before lifting. Move obstacles out of the way. Test before lifting; ask for help if too heavy. Tighten stomach muscles without holding breath. Use smooth movements; do not jerk. Use legs to do the work, and pivot with feet. Distribute the work load symmetrically and close to the center of trunk. Push instead of pull whenever possible.   Ask For Help   Ask for help and delegate to others when possible. Coordinate your movements when lifting together, and maintain the low back curve.  Log Roll   Lying on back, bend left knee and place left  arm across chest. Roll all in one movement to the right. Reverse to roll to the left. Always move as one unit. Housework - Sweeping  Use long-handled equipment to avoid stooping.   Housework - Wiping  Position yourself as close as possible to reach work surface. Avoid straining your back.  Laundry - Unloading Wash   To unload small items at bottom of washer, lift leg opposite to arm being used to reach.  Gardening - Raking  Move close to area to be raked. Use arm movements to do the work. Keep back straight and avoid twisting.      Cart  When reaching into cart with one arm, lift opposite leg to keep back straight.   Getting Into / Out of Bed  Lower self to lie down on one side by raising legs and lowering head at the same time. Use arms to assist moving without twisting. Bend both knees to roll onto back if desired. To sit up, start from lying on side, and use same move-ments in reverse. Housework - Vacuuming  Hold the vacuum with arm held at side. Step back and forth to move it, keeping head up. Avoid twisting.   Laundry - Armed forces training and education officer so that bending and twisting can be avoided.   Laundry - Unloading Dryer  Squat down to reach into clothes dryer or use a reacher.  Gardening - Weeding / Psychiatric nurse or Kneel. Knee pads may be helpful.                     Garen Lah, PT, [facility] Certified Exercise Expert for the Aging Adult  10/31/23 3:12 PM Phone: [phone] Fax: [phone]

## 2023-11-07 ENCOUNTER — Encounter: Payer: Self-pay | Admitting: Physical Therapy

## 2023-11-07 ENCOUNTER — Ambulatory Visit: Payer: 59 | Admitting: Physical Therapy

## 2023-11-07 DIAGNOSIS — R293 Abnormal posture: Secondary | ICD-10-CM | POA: Diagnosis not present

## 2023-11-07 DIAGNOSIS — G8929 Other chronic pain: Secondary | ICD-10-CM

## 2023-11-07 DIAGNOSIS — R2689 Other abnormalities of gait and mobility: Secondary | ICD-10-CM | POA: Diagnosis not present

## 2023-11-07 DIAGNOSIS — M5441 Lumbago with sciatica, right side: Secondary | ICD-10-CM | POA: Diagnosis not present

## 2023-11-07 NOTE — Therapy (Signed)
OUTPATIENT PHYSICAL THERAPY TREATMENT NOTE   Patient Name: Brandon Gordon MRN: [id number] DOB:[birth year], 52 y.o., male Today's Date: 11/07/2023  END OF SESSION:  PT End of Session - 11/07/23 1335     Visit Number 14    Number of Visits 16    Date for PT Re-Evaluation 11/16/23    Authorization Type [place] [employer]    PT Start Time 1330    PT Stop Time 1415    PT Time Calculation (min) 45 min    Activity Tolerance Patient tolerated treatment well;Patient limited by pain    Behavior During Therapy [facility] for tasks assessed/performed                        Past Medical History:  Diagnosis Date   Anxiety    At risk for sleep apnea    STOP-BANG= 5      SENT TO PCP 04-05-2016   Chronic pain syndrome    History of kidney stones    Hyperlipidemia    Hypertension    Left ureteral stone    OA (osteoarthritis)    back, hip   Pain management    Type 2 diabetes mellitus (HCC)    Past Surgical History:  Procedure Laterality Date   TOTAL HIP ARTHROPLASTY Right 04-22-2011   Patient Active Problem List   Diagnosis Date Noted   Unilateral primary osteoarthritis, left hip 10/30/2023    PCP: Noberto Retort, MD   REFERRING PROVIDER: Bedelia Person, MD   REFERRING DIAG: [phone] (ICD-10-CM) - Spondylosis without myelopathy or radiculopathy, lumbar region   Rationale for Evaluation and Treatment: Rehabilitation  THERAPY DIAG:  Chronic bilateral low back pain with right-sided sciatica  Abnormal posture  Other abnormalities of gait and mobility  ONSET DATE: 20 years of back pain/ scoliosis,  recently pain in back and continuing to bend back  SUBJECTIVE:                                                                                                                                                                                           SUBJECTIVE STATEMENT: 11/07/2023  I was on the floor and lying floor and I broke my Great toe on Left when rising from  the ground with bruising and swelling.  Dr Magnus Ivan is trying to get me in for hip THA if there is an opening      EVAL-  My wife is [name] PT at [employer].  I am now in a job in which I have to bend over a desk.  I am 6'6"   I played  basketball [place] [employer] in college.  Pain in back  is overwhelming. I have a special needs son. I have been having pain that really effects my life. I have severe scoliosis and a hx of compressed L4 L5 . I feel like the hunchback of [facility].  I also swim at [facility]  and the [facility] in winter.  I have been in a new job with about 8 hours of sitting.   I do not have a standing desk. I work 40% in office and 60% at home.  I have had dry needling and it was helpful and water. 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  I use 20lb  DB to do bench press etc.   Pt with long history of back pain and lifelong dx of scoliosis  PERTINENT HISTORY:  Chronic pain syndrome, hyperlipidemia, HTN, OA DM Type 2, Pain managament. THR R 12 years ago May 2012.  Left shoulder hypermobility and chronic dislocation since basketball days. Surgery of 5th toe infection March 2003. Anxiety  PAIN:  Are you having pain? Yes: NPRS scale: at rest 3/10 and  7/10 Pain location: low back radiating into R buttock and thoracic pain at scoliotic curve Pain description: sharp constant and spasming Aggravating factors: sitting for longer than 30 min, walking for longer  than 5-8 min and begin spasm Relieving factors: Rest Household chores and carrying laundry, any walking  and unable to do any gold swing  PRECAUTIONS: None  RED FLAGS: Bowel or bladder incontinence: No and Spinal tumors: No   WEIGHT BEARING RESTRICTIONS: No  FALLS:  Has patient fallen in last 6 months? 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  PLOF: Independent  PATIENT GOALS: To be able to walk again and try to play golf , try to strengthen safely  NEXT MD VISIT: TBD  OBJECTIVE: (objective measures completed at initial evaluation unless otherwise dated)   DIAGNOSTIC FINDINGS:  Recently taken at [facility] medical  PATIENT SURVEYS:  FOTO 47% Predicted 56% 10-03-23  60%  SCREENING FOR RED FLAGS: Bowel or bladder incontinence: No Spinal tumors: No   COGNITION: Overall cognitive status: Within functional limits for tasks assessed     SENSATION: No numbness but sometimes tingling on top of feet and I sometimes have fasciculations  MUSCLE LENGTH: :Thomas test Right  WNL able to flatten deg; Left -32 from horizontal deg  Hamstrings: Right 74 deg; Left 69 deg  1/2 range of bridge only  POSTURE: rounded shoulders, forward head, and scoliosis with R elevated pelvis and scoliotic curve leaning to the right in sitting and standing with flexed and  rotated to Left posture  PALPATION: Pt with tightened Left QL, tightened bil hamstrings and  especially Left hip flexor.    LUMBAR ROM: P! - Pain severity  AROM eval 10-03-23 11-07-23  Flexion Finger tips to mid shins  Fingertips to lower 1/3 of shin Fingertips to 1 inch from ankle crease  Extension -15 extension P! Unable to come to neutral ext -15 extension P! Unable to come to neutral ext -15 extension P! Unable to come to neutral ext  Right lateral flexion Finger tip 1.5 inch above knee jt  line P! Fingertips to 1.75 inch above knee jt line No pain Fingertips to 1.5inch above knee jt line No pain  Left lateral flexion Finger tip to fibular head  Finger tip to  just below fibular head  Fingertip to below fibular head  Right rotation 50% 50% 75%  Left rotation 50% Stands in left rotation 50% Stands in left rotation 75% Stands in left rotation   (Blank rows =  not tested)  LOWER EXTREMITY ROM:     Active  Right eval Left eval R/L 09-28-23  Hip flexion Supine 120 Supine 100 unable to come to neutral Supine 120/supine 109  Hip extension     Hip abduction     Hip adduction     Hip internal rotation 22P!!! 12 P!!! 25/ 12 blocked bony  Hip external rotation 60 55 60/55  Knee flexion     Knee extension     Ankle dorsiflexion     Ankle plantarflexion     Ankle inversion     Ankle eversion      (Blank rows = not tested) 10-03-23  Thomas test +   left 40 from horizontal, R -5 from neutral LOWER EXTREMITY MMT:    MMT Right eval Left eval R/L 10-03-23  Hip flexion 4+ 4+ 4+/4+  Hip extension 3-/ restricted  motion unable to come to neutral  4-/3-/ restricted  motion unable to come to neutral  Hip abduction 3+ 4- 4-/4-  Hip adduction     Hip internal rotation     Hip external rotation     Knee flexion 5 4+ 5/4+  Knee extension 5 5 5/5  Ankle dorsiflexion     Ankle plantarflexion     Ankle inversion     Ankle eversion      (Blank rows = not tested) 10-10-23  flexion  R  145, L130   FUNCTIONAL TESTS:  5 times sit to stand: 17.95 sec  09-28-23  5 x STS    10.65 sec  GAIT: Distance walked: 150 feet Assistive device utilized: None Level of assistance: Complete Independence Comments: Ambulates with flexed posture  with left and rotation to left posture.  Pt with left pelvic level higher than Right and concave scoliosis curve on Right  TODAY'S TREATMENT:  [facility] Adult PT Treatment:                                                DATE: Nov 10, 2023 Therapeutic Exercise: Dead lift 30 lb 3 x 6  with 1 min rest Lift bar 1 x 8 35 #    lift bar with 55 #  3 x 8 Education about proper lifting technique with VC/TC Manual Therapy: STW  Bil UT/LS, C- 7 o T-10 in prone, bil QL Trigger Point Dry-Needling performed     by Garen Lah Treatment instructions: Expect mild to moderate muscle soreness. S/S of pneumothorax if dry needled over a lung  field, and to seek immediate medical attention should they occur. Patient verbalized understanding of these instructions and education.  Patient Consent Given: Yes Education handout provided: Previously provided Muscles treated  Bil UT/LS, C- 7 o T-10 in prone, bil QL Electrical stimulation performed: No Parameters: N/A Treatment response/outcome: twitch response noted, pt noted relief  [facility] Adult PT Treatment:  DATE: 10-31-23 Therapeutic Exercise: Marjo Bicker pose forward and to Right and Left Quadriped Thread the needle Squat and back extension using counter Manual Therapy: STW to all TPDN muscles with decreased muscle tension  Bil UT, C- 5 to T-8 in prone, Left rectus femoris Thoracic and lumbar grade III mobs LAD of L LE Trigger Point Dry-Needling performed     by Garen Lah Treatment instructions: Expect mild to moderate muscle soreness. S/S of pneumothorax if dry needled over a lung field, and to seek immediate medical attention should they occur. Patient verbalized understanding of these instructions and education.  Patient Consent Given: Yes Education handout provided: Previously provided Muscles treated  Bil UT, C- 5 to T-8 in prone, Left rectus femoris Electrical stimulation performed: No Parameters: N/A Treatment response/outcome: twitch response noted, pt noted relief  Self Care: Posture and body mechanic handout LIfting mechanics     [facility] Adult PT Treatment:                                                DATE: 10-10-23 Therapeutic Exercise: Talll kneeling for and elongating spine Vertical bridge 2 x 10 Stand to tall kneeling x 5 Elongation of spine from sink Dowel lat stretch with bil UE palms up x 5 Manual Therapy: STW to all TPDN muscles with decreased muscle tension  Bil UT, C- 5 to T-8 on left side, L/R subscapularis, bil QL Thoracic and lumbar grade III mobs PROM of bil UE for flex/abduction/ IR/ER Trigger  Point Dry-Needling performed     by Garen Lah Treatment instructions: Expect mild to moderate muscle soreness. S/S of pneumothorax if dry needled over a lung field, and to seek immediate medical attention should they occur. Patient verbalized understanding of these instructions and education.  Patient Consent Given: Yes Education handout provided: Previously provided Muscles treated  Bil UT, C- 5 to T-8 on left side, L/R subscapularis, bil QL Electrical stimulation performed: No Parameters: N/A Treatment response/outcome: twitch response noted, pt noted relief  [facility] Adult PT Treatment:                                                DATE: 10-05-23  Manual Therapy: MccConnell tape of  R knee lateral to medial STW to all TPDN muscles with decreased muscle tension Thoracic and lumbar grade III mobs Trigger Point Dry-Needling performed     by Garen Lah Treatment instructions: Expect mild to moderate muscle soreness. S/S of pneumothorax if dry needled over a lung field, and to seek immediate medical attention should they occur. Patient verbalized understanding of these instructions and education.  Patient Consent Given: Yes Education handout provided: Previously provided Muscles treated  R QL  right only  T -4 to T-10  . R Lat  R UT Electrical stimulation performed: No Parameters: N/A Treatment response/outcome: twitch response noted, pt noted relief Modalites  Ice pack to R knee and moist hot pack to R QL.  Therapeutic Activity: Golfing simulation rotating for R and to left working on hip rotation Practicing Tai Chi motion with left UE serratus punch while retracting R UE for derotation of Walking across grass on non compliant surface to simulate walking on ground on golf course   PATIENT EDUCATION:  Education details: rationale for interventions Person educated: Patient Education method: Explanation, Demonstration, Tactile cues, and Verbal cues Education comprehension:  verbalized understanding, returned demonstration, verbal cues required, and tactile cues required  HOME EXERCISE PROGRAM:  Access Code: [email or web address] URL: [email or web address] Date: 09/18/2023 Prepared by: Fransisco Hertz  Program Notes - with lat pull down, please keep elbows close to body, and do one arm at a time  Exercises - Seated Lat Pull Down with Resistance - Elbows Bent  - 2-3 x daily - 7 x weekly - 1 sets - 8 reps - Standing Single Arm Low Row with Anchored Resistance  - 2-3 x daily - 7 x weekly - 1 sets - 8 reps - Single Arm Punch with Resistance  - 2-3 x daily - 7 x weekly - 1 sets - 8 reps Added to HEP 09-26-23 Program Notes - with lat pull down, please keep elbows close to body, and do one arm at a timePhysio ball forward and to the right push forward. - [place] Deadlift With Barbell  - 1 x daily - 7 x weekly - 3 sets - 10 reps - Supine Bridge with Heels on Swiss Ball and Knees Bent  - 1 x daily - 7 x weekly - 2 sets - 10 reps Added 10-03-23 Goblet Squat with Kettlebell  - 1 x daily - 7 x weekly - 3 sets - 10 reps - Side plank on R side-lying only  - 1 x daily - 7 x weekly - 3 sets - 10 reps - Gastroc Stretch on Wall  - 1 x daily - 7 x weekly - 1 sets - 3 reps - 30 sec hold - Soleus Stretch on Wall  - 1 x daily - 7 x weekly - 1 sets - 3 reps - 30 sec hold  10-05-23 - Supine Sciatic Nerve Glide  - 1 x daily - 7 x weekly - 1 sets - 10 rep ASSESSMENT:  CLINICAL IMPRESSION: 11/07/2023 Pt arrived with 4/10 back and neck pain.  Pt also had a broken great toe on left that was wrapped and caused some imbalance with walking back to clinic room.  Pt  educated on local community wellness opportunities while TPDN   Pt states he is feeling stronger and was able to function and tolerate prone position which has been difficult since evaluation. Pt consented to TPDN and was closely monitored throughout session. Pt with decrease muscle tension and reports feeling taller after  session and stronger. Pt after TPDN was able to be educated on dead lift proper lifting technique and peform with increased /progressive weights.  LTG # 3 and #4 Met today   Pt departs today's session in no acute distress, all voiced questions/concerns addressed appropriately from PT perspective. Pt will attend one more PT visit to consolidate HEP before DC     EVAL- Patient is a 52 y.o. male who was seen today for physical therapy evaluation and treatment for spondylosis  and moderate scoliosis with flexed posture and scoliosis rotated to left with convex on Right. Including R sided sciatic pain.  Pt has has long history of back pain and lifetime dx of scoliosis.  He has thoracic.lumbar spasming into R buttock.  Pt with flexibility issues contributing to pain.He has a job In which he sits and works on [employer] also contributing to his over flexed posture. Pt would benefit form standing desk and HEP/helps to decrease seated time. Pt will benefit from skilled PT to reduce pain and increase strength and  flexibility to return to as neutral as possible. Pt needs to care for special needs son. He is limited due to pain in daily sedentary job and with sleep at night. He desires to return to a more active lifestyle playing racquet sports and golf as able but his sedentary job adds to his flexed and scoliotic posture. Will continue to maximize AROM and strength as able to add more varied leisure activities     OBJECTIVE IMPAIRMENTS: decreased activity tolerance, decreased knowledge of condition, difficulty walking, decreased ROM, decreased strength, increased fascial restrictions, improper body mechanics, postural dysfunction, and obesity.   ACTIVITY LIMITATIONS: carrying, bending, sitting, standing, squatting, sleeping, stairs, locomotion level, and stairs  PARTICIPATION LIMITATIONS: driving, community activity, occupation, and caring for special needs son  PERSONAL FACTORS: Chronic pain syndrome,  hyperlipidemia, HTN, OA DM Type 2, Pain managament. THR R 12 years ago May 2012.  Left shoulder hypermobility and chronic dislocation since basketball days. Surgery of 5th toe infection March 2003. Anxiety are also affecting patient's functional outcome.   REHAB POTENTIAL: Good  CLINICAL DECISION MAKING: Evolving/moderate complexity  EVALUATION COMPLEXITY: Moderate   GOALS: Goals reviewed with patient? Yes  SHORT TERM GOALS: Target date: 09-14-23  Pt will be independent with initial HEP Baseline:no knowledge Goal status:MET  2.  Pt will work on sleep positions to increase more comfort in order to have at least 5 or more hours of restorative sleep Baseline:  tosses and turns at night and has increased pain Goal status:MET  3.  Demonstrate understanding of  as neutral posture as possible with scoliosis and be more conscious of position and posture throughout the day.  Baseline: no knowledge Goal status: MET  4.  Pain with going up and down steps with maximum safety Baseline: Pt descends steps backwards due to size 16 shoe 09-28-23  Pt continues to descend backwards for safety due to large sixe 16 feet and safety Goal status:MET   LONG TERM GOALS: Target date: 10-05-23 revised  11-16-23  Pt will be independent with advanced HEP using gym equipment and join gym upon DC Baseline: is not a part of gym Goal status: ONGOING  2.  Pt will be able to walk for  25 minutes using pain modification with position and stretching Baseline: Can only walk for 5-8 minutes 10-03-23  10 min  Goal status: ONGOING  3.  Pt will be able to pick up 30 to 45# KB with proper form for exercise Baseline:  09-26-23  working on 55 # lift bar 11-07-23   55# lift bar Goal status: MET  4.  Pt will be able to improve flexibility in order to try to begin racquet sports /golf as able.  Baseline: unable to participate now 10-03-23  Close to being able to make a full swing with golf club 11-06-23  Pt has  been able to attempt golf before fracturing Left Great toe Goal status:MET 5.  Pain with activity will decrease  to 3/10 instead of 7/10 Baseline: At worse with functional activity 7/10 10-03-23  Pt pain at 4/10 today  Goal status: ONGOING  6.  Pt will simulate golfing/ racquet sports and add strengthening/flexibility exercises that will enhance ability Baseline: Unable to  play sports at present due to spasming 10-03-23  Pt with difficulty turning neck while using gold club.  Close to doing a swing. Goal status: ONGOING  7. Pt will investigate bracing for R knee in order to protect knee for trunk and rotational dynamic lunging exercises  Baseline,  R knee with patellar and lateral movement  Goal: ONGOING   PLAN:  PT FREQUENCY: 1-2x/week  PT DURATION: 6 weeks  PLANNED INTERVENTIONS: Therapeutic exercises, Therapeutic activity, Neuromuscular re-education, Balance training, Gait training, Patient/Family education, Self Care, Joint mobilization, Stair training, Dry Needling, Electrical stimulation, Spinal mobilization, Cryotherapy, Moist heat, Taping, Ionotophoresis 4mg /ml Dexamethasone, Manual therapy, and Re-evaluation.  PLAN FOR NEXT SESSION: review/update HEP PRN. Continue with multiplanar rotational activities, hip flexor mobility, working into postural extension.    Work on opening up Left hip. May need a customized knee brace on Right Must do 'GOALS next visit   Garen Lah, PT, [facility] Certified Exercise Expert for the Aging Adult  11/07/23 2:35 PM Phone: [phone] Fax: [phone]

## 2023-11-11 DIAGNOSIS — M79672 Pain in left foot: Secondary | ICD-10-CM | POA: Diagnosis not present

## 2023-11-11 DIAGNOSIS — L0390 Cellulitis, unspecified: Secondary | ICD-10-CM | POA: Diagnosis not present

## 2023-11-13 ENCOUNTER — Other Ambulatory Visit (HOSPITAL_COMMUNITY): Payer: Self-pay

## 2023-11-13 MED ORDER — BUPROPION HCL ER (XL) 300 MG PO TB24
300.0000 mg | ORAL_TABLET | Freq: Every morning | ORAL | 1 refills | Status: DC
  Filled 2023-11-13: qty 90, 90d supply, fill #0
  Filled 2024-02-07: qty 90, 90d supply, fill #1

## 2023-11-13 MED ORDER — AMOXICILLIN-POT CLAVULANATE 875-125 MG PO TABS
1.0000 | ORAL_TABLET | Freq: Two times a day (BID) | ORAL | 0 refills | Status: AC
  Filled 2023-11-13: qty 10, 5d supply, fill #0

## 2023-11-13 MED ORDER — ALPRAZOLAM 1 MG PO TABS
2.0000 mg | ORAL_TABLET | Freq: Every day | ORAL | 3 refills | Status: AC | PRN
  Filled 2023-11-13 – 2023-11-15 (×2): qty 60, 30d supply, fill #0
  Filled 2023-12-26: qty 60, 30d supply, fill #1
  Filled 2024-05-09: qty 60, 30d supply, fill #2

## 2023-11-13 MED ORDER — OZEMPIC (1 MG/DOSE) 4 MG/3ML ~~LOC~~ SOPN
1.0000 mg | PEN_INJECTOR | SUBCUTANEOUS | 5 refills | Status: DC
  Filled 2023-11-13 – 2023-11-23 (×7): qty 3, 28d supply, fill #0
  Filled 2024-04-03: qty 3, 28d supply, fill #1
  Filled ????-??-??: fill #0

## 2023-11-14 ENCOUNTER — Ambulatory Visit: Payer: 59 | Admitting: Physical Therapy

## 2023-11-14 DIAGNOSIS — L0390 Cellulitis, unspecified: Secondary | ICD-10-CM | POA: Diagnosis not present

## 2023-11-14 DIAGNOSIS — S92919A Unspecified fracture of unspecified toe(s), initial encounter for closed fracture: Secondary | ICD-10-CM | POA: Diagnosis not present

## 2023-11-15 ENCOUNTER — Ambulatory Visit: Payer: 59 | Admitting: Orthopaedic Surgery

## 2023-11-15 ENCOUNTER — Other Ambulatory Visit (HOSPITAL_COMMUNITY): Payer: Self-pay

## 2023-11-15 ENCOUNTER — Other Ambulatory Visit: Payer: Self-pay

## 2023-11-20 ENCOUNTER — Other Ambulatory Visit (HOSPITAL_COMMUNITY): Payer: Self-pay

## 2023-11-21 ENCOUNTER — Other Ambulatory Visit (HOSPITAL_COMMUNITY): Payer: Self-pay

## 2023-11-21 DIAGNOSIS — F1120 Opioid dependence, uncomplicated: Secondary | ICD-10-CM | POA: Diagnosis not present

## 2023-11-21 NOTE — Therapy (Signed)
OUTPATIENT PHYSICAL THERAPY TREATMENT NOTE/PHYSICAL THERAPY DISCHARGE SUMMARY  Visits from Start of Care: 15  Current functional level related to goals / functional outcomes: As indicated below   Remaining deficits: Pt with increased tissue extensibility but scoliotic curve remains.   Pt left hip osteoarthritic with bony block and will have surgery on 12-08-23 for L THR by Dr Magnus Ivan   Education / Equipment: HEP and community resources   Patient agrees to discharge. Patient goals were met.  Except for those deferred due to Left hip.  Patient is being discharged due to being pleased with the current functional level. And also surgery scheduled for 12-08-23 for Left THR    Patient Name: Brandon Gordon MRN: [id number] DOB:02/11/1971, 52 y.o., male Today's Date: 11/23/2023  END OF SESSION:  PT End of Session - 11/23/23 1105     Visit Number 15    Number of Visits 16    Date for PT Re-Evaluation 11/16/23    Authorization Type [place] [employer]    PT Start Time 1100    PT Stop Time 1145    PT Time Calculation (min) 45 min    Activity Tolerance Patient tolerated treatment well    Behavior During Therapy [facility] for tasks assessed/performed                         Past Medical History:  Diagnosis Date   Anxiety    At risk for sleep apnea    STOP-BANG= 5      SENT TO PCP 04-05-2016   Chronic pain syndrome    History of kidney stones    Hyperlipidemia    Hypertension    Left ureteral stone    OA (osteoarthritis)    back, hip   Pain management    Type 2 diabetes mellitus (HCC)    Past Surgical History:  Procedure Laterality Date   TOTAL HIP ARTHROPLASTY Right 04-22-2011   Patient Active Problem List   Diagnosis Date Noted   Unilateral primary osteoarthritis, left hip 10/30/2023    PCP: Noberto Retort, MD   REFERRING PROVIDER: Bedelia Person, MD   REFERRING DIAG: [phone] (ICD-10-CM) - Spondylosis without myelopathy or radiculopathy, lumbar  region   Rationale for Evaluation and Treatment: Rehabilitation  THERAPY DIAG:  Chronic bilateral low back pain with right-sided sciatica  Abnormal posture  Other abnormalities of gait and mobility  ONSET DATE: 20 years of back pain/ scoliosis,  recently pain in back and continuing to bend back  SUBJECTIVE:  SUBJECTIVE STATEMENT: 11/23/2023  I was working at gym on my own with 2 25 # DB.  I have a THR left surgery scheduled for 12-08-23      EVAL-  My wife is [name] PT at [employer].  I am now in a job in which I have to bend over a desk.  I am 6'6"   I played basketball [address], [address] and [employer] in college.  Pain in back  is overwhelming. I have a special needs son. I have been having pain that really effects my life. I have severe scoliosis and a hx of compressed L4 L5 . I feel like the hunchback of [facility].  I also swim at [facility]  and the [facility] in winter.  I have been in a new job with about 8 hours of sitting.   I do not have a standing desk. I work 40% in office and 60% at home.  I have had dry needling and it was helpful and water. 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  I use 20lb  DB to do bench press etc.   Pt with long history of back pain and lifelong dx of scoliosis  PERTINENT HISTORY:  Chronic pain syndrome, hyperlipidemia, HTN, OA DM Type 2, Pain managament. THR R 12 years ago May 2012.  Left shoulder hypermobility and chronic dislocation since basketball days. Surgery of 5th toe infection March 2003. Anxiety  PAIN:  Are you having pain? Yes: NPRS scale: at rest 3/10 and  7/10 Pain location: low back radiating into R buttock and thoracic pain at scoliotic curve Pain description: sharp constant and spasming Aggravating factors: sitting for longer than 30 min, walking for longer  than 5-8 min and  begin spasm Relieving factors: Rest Household chores and carrying laundry, any walking  and unable to do any gold swing  PRECAUTIONS: None  RED FLAGS: Bowel or bladder incontinence: No and Spinal tumors: No   WEIGHT BEARING RESTRICTIONS: No  FALLS:  Has patient fallen in last 6 months? 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  PLOF: Independent  PATIENT GOALS: To be able to walk again and try to play golf , try to strengthen safely  NEXT MD VISIT: TBD  OBJECTIVE: (objective measures completed at initial evaluation unless otherwise dated)   DIAGNOSTIC FINDINGS:  Recently taken at [facility] medical  PATIENT SURVEYS:  FOTO 47% Predicted 56% 10-03-23  60%  SCREENING FOR RED FLAGS: Bowel or bladder incontinence: No Spinal tumors: No   COGNITION: Overall cognitive status: Within functional limits for tasks assessed     SENSATION: No numbness but sometimes tingling on top of feet and I sometimes have fasciculations  MUSCLE LENGTH: :Thomas test Right  WNL able to flatten deg; Left -32 from horizontal deg  Hamstrings: Right 74 deg; Left 69 deg  1/2 range of bridge only  POSTURE: rounded shoulders, forward head, and scoliosis with R elevated pelvis and scoliotic curve leaning to the right in sitting and standing with flexed and  rotated to Left posture  PALPATION: Pt with tightened Left QL, tightened bil hamstrings and  especially Left hip flexor.    LUMBAR ROM: P! - Pain severity  AROM eval 10-03-23 11-07-23 11-23-23  Flexion Finger tips to mid shins  Fingertips to lower 1/3 of shin Fingertips to 1 inch from ankle crease Finger tips to ankle crease  Extension -15 extension P!  Unable to come to neutral ext -15 extension P! Unable to come to neutral ext -15 extension P! Unable to come to neutral ext -15 extension P! Unable to come to neutral ext but decreased pain  Right lateral flexion Finger tip 1.5 inch above knee jt line P! Fingertips to 1.75 inch above knee jt line No pain Fingertips to 1.5inch above knee jt line No pain Fingertips to 1.25inch above knee jt line No pain  Left lateral flexion Finger tip to fibular head  Finger tip to  just below fibular head  Fingertip to below fibular head Fingertip to below fibular head  Right rotation 50% 50% 75% 75%  Left rotation 50% Stands in left rotation 50% Stands in left rotation 75% Stands in left rotation 75% Stands in left rotation   (Blank rows = not tested)  LOWER EXTREMITY ROM:     Active  Right eval Left eval R/L 09-28-23 R/L 11-23-23  Hip flexion Supine 120 Supine 100 unable to come to neutral Supine 120/supine 109 Supine 120-right  Supine 112 left   Hip extension      Hip abduction      Hip adduction      Hip internal rotation 22P!!! 12 P!!! 25/ 12 blocked bony 30/ 12 blocked bony  Hip external rotation 60 55 60/55 64/55  Knee flexion      Knee extension      Ankle dorsiflexion      Ankle plantarflexion      Ankle inversion      Ankle eversion       (Blank rows = not tested) 10-03-23  Thomas test +   left 40 from horizontal, R -5 from neutral LOWER EXTREMITY MMT:    MMT Right eval Left eval R/L 10-03-23 R/L 11-23-23  Hip flexion 4+ 4+ 4+/4+ 5/4+  Hip extension 3-/ restricted  motion unable to come to neutral  4-/3-/ restricted  motion unable to come to neutral 5 /3-restricted  motion unable to come to neutral  Hip abduction 3+ 4- 4-/4- 5/4-  Hip adduction      Hip internal rotation      Hip external rotation      Knee flexion 5 4+ 5/4+ 5/5  Knee extension 5 5 5/5 5/5  Ankle dorsiflexion      Ankle plantarflexion      Ankle inversion      Ankle eversion       (Blank rows = not  tested) 10-10-23  flexion  R  145, L130   FUNCTIONAL TESTS:  5 times sit to stand: 17.95 sec  09-28-23  5 x STS    10.65 sec  GAIT: Distance walked: 150 feet Assistive device utilized: None Level of assistance: Complete Independence Comments: Ambulates with flexed posture  with left and rotation to left posture.  Pt with left pelvic level higher than Right and concave scoliosis curve on Right  TODAY'S TREATMENT:  [facility] Adult PT Treatment:                                                DATE: 11-23-23 Therapeutic Exercise: Wall stretching and sliding Back stretch  with arms at counter Cat/Camel  Manual Therapy: STW  Bil UT/LS, C- 7 to T-12 in prone, bil QL, left infraspinatus,  over left ribs T-8 to t-12 LAD left LE Trigger Point Dry-Needling performed     by Garen Lah Treatment instructions: Expect mild to moderate muscle soreness. S/S of pneumothorax if dry needled over a lung field, and to seek immediate medical attention should they occur. Patient verbalized understanding of these instructions and education.  Patient Consent Given: Yes Education handout provided: Previously provided Muscles treated  Bil UT/LS, C- 7 o T-12 in prone, bil QL/left infraspinatus,  over left ribs T-8 to t-12 Electrical stimulation performed: No Parameters: N/A Treatment response/outcome: twitch response noted, pt noted relief  Self Care: Community wellness and information  [facility] Adult PT Treatment:                                                DATE: 11-30-23 Therapeutic Exercise: Dead lift 30 lb 3 x 6  with 1 min rest Lift bar 1 x 8 35 #    lift bar with 55 #  3 x 8 Education about proper lifting technique with VC/TC Manual Therapy: STW  Bil UT/LS, C- 7 o T-10 in prone, bil QL Trigger Point Dry-Needling performed     by Garen Lah Treatment instructions: Expect mild to moderate muscle soreness. S/S of pneumothorax if dry needled over a lung field, and to seek immediate medical  attention should they occur. Patient verbalized understanding of these instructions and education.  Patient Consent Given: Yes Education handout provided: Previously provided Muscles treated  Bil UT/LS, C- 7 o T-10 in prone, bil QL Electrical stimulation performed: No Parameters: N/A Treatment response/outcome: twitch response noted, pt noted relief  [facility] Adult PT Treatment:                                                DATE: 10-31-23 Therapeutic Exercise: Marjo Bicker pose forward and to Right and Left Quadriped Thread the needle Squat and back extension using counter Manual Therapy: STW to all TPDN muscles with decreased muscle tension  Bil UT, C- 5 to T-8 in prone, Left rectus femoris Thoracic and lumbar grade III mobs LAD of L LE Trigger Point Dry-Needling performed     by Garen Lah Treatment instructions: Expect mild to moderate muscle soreness. S/S of pneumothorax if dry needled over a lung field, and to seek immediate medical attention should they occur. Patient verbalized understanding of these instructions and education.  Patient Consent Given: Yes Education handout provided: Previously provided Muscles treated  Bil UT, C- 5 to T-8 in prone, Left rectus femoris Electrical stimulation performed: No Parameters: N/A Treatment response/outcome: twitch response noted, pt noted relief  Self Care: Posture and body mechanic handout LIfting mechanics     [facility] Adult PT Treatment:                                                DATE: 10-10-23 Therapeutic Exercise: Talll kneeling for and elongating spine Vertical bridge 2 x 10 Stand to tall kneeling x 5 Elongation  of spine from sink Dowel lat stretch with bil UE palms up x 5 Manual Therapy: STW to all TPDN muscles with decreased muscle tension  Bil UT, C- 5 to T-8 on left side, L/R subscapularis, bil QL Thoracic and lumbar grade III mobs PROM of bil UE for flex/abduction/ IR/ER Trigger Point Dry-Needling performed     by  Garen Lah Treatment instructions: Expect mild to moderate muscle soreness. S/S of pneumothorax if dry needled over a lung field, and to seek immediate medical attention should they occur. Patient verbalized understanding of these instructions and education.  Patient Consent Given: Yes Education handout provided: Previously provided Muscles treated  Bil UT, C- 5 to T-8 on left side, L/R subscapularis, bil QL Electrical stimulation performed: No Parameters: N/A Treatment response/outcome: twitch response noted, pt noted relief  [facility] Adult PT Treatment:                                                DATE: 10-05-23  Manual Therapy: MccConnell tape of  R knee lateral to medial STW to all TPDN muscles with decreased muscle tension Thoracic and lumbar grade III mobs Trigger Point Dry-Needling performed     by Garen Lah Treatment instructions: Expect mild to moderate muscle soreness. S/S of pneumothorax if dry needled over a lung field, and to seek immediate medical attention should they occur. Patient verbalized understanding of these instructions and education.  Patient Consent Given: Yes Education handout provided: Previously provided Muscles treated  R QL  right only  T -4 to T-10  . R Lat  R UT Electrical stimulation performed: No Parameters: N/A Treatment response/outcome: twitch response noted, pt noted relief Modalites  Ice pack to R knee and moist hot pack to R QL.  Therapeutic Activity: Golfing simulation rotating for R and to left working on hip rotation Practicing Tai Chi motion with left UE serratus punch while retracting R UE for derotation of Walking across grass on non compliant surface to simulate walking on ground on golf course   PATIENT EDUCATION:  Education details: rationale for interventions Person educated: Patient Education method: Explanation, Demonstration, Tactile cues, and Verbal cues Education comprehension: verbalized understanding, returned  demonstration, verbal cues required, and tactile cues required  HOME EXERCISE PROGRAM:  Access Code: [email or web address] URL: [email or web address] Date: 09/18/2023 Prepared by: Fransisco Hertz  Program Notes - with lat pull down, please keep elbows close to body, and do one arm at a time  Exercises - Seated Lat Pull Down with Resistance - Elbows Bent  - 2-3 x daily - 7 x weekly - 1 sets - 8 reps - Standing Single Arm Low Row with Anchored Resistance  - 2-3 x daily - 7 x weekly - 1 sets - 8 reps - Single Arm Punch with Resistance  - 2-3 x daily - 7 x weekly - 1 sets - 8 reps Added to HEP 09-26-23 Program Notes - with lat pull down, please keep elbows close to body, and do one arm at a timePhysio ball forward and to the right push forward. - [place] Deadlift With Barbell  - 1 x daily - 7 x weekly - 3 sets - 10 reps - Supine Bridge with Heels on Swiss Ball and Knees Bent  - 1 x daily - 7 x weekly - 2 sets - 10 reps Added 10-03-23 Goblet Squat with Kettlebell  -  1 x daily - 7 x weekly - 3 sets - 10 reps - Side plank on R side-lying only  - 1 x daily - 7 x weekly - 3 sets - 10 reps - Gastroc Stretch on Wall  - 1 x daily - 7 x weekly - 1 sets - 3 reps - 30 sec hold - Soleus Stretch on Wall  - 1 x daily - 7 x weekly - 1 sets - 3 reps - 30 sec hold  10-05-23 - Supine Sciatic Nerve Glide  - 1 x daily - 7 x weekly - 1 sets - 10 rep ASSESSMENT:  CLINICAL IMPRESSION: 11/23/2023 Pt arrived with 2-3/10 back and neck pain.  Pt reports that he will have Left THR surgery and has appreciated PT and all he has learned during his time.   Pt with slightly increased AROM except for left hip with bony block.  Pt LTG all me except for # 2 and 6 due to requiring hip surgery and walking limited due to pain.  Pt is very enthusiastic and diligent to exercise and follow through on all instructions. His pain has been minimized except for Left hip due to OA.  Pt  reinforced on information on  local community  wellness opportunities   Pt states he is feeling stronger and was able to function and tolerate prone position which has been difficult since evaluation. Pt consented to TPDN and was closely monitored throughout session. Pt with decrease muscle tension and reports feeling taller after session and stronger. Pt departs today's session in no acute distress, all voiced questions/concerns addressed appropriately from PT perspective. Pt has been a  joy for whom to  serve.     EVAL- Patient is a 52 y.o. male who was seen today for physical therapy evaluation and treatment for spondylosis  and moderate scoliosis with flexed posture and scoliosis rotated to left with convex on Right. Including R sided sciatic pain.  Pt has has long history of back pain and lifetime dx of scoliosis.  He has thoracic.lumbar spasming into R buttock.  Pt with flexibility issues contributing to pain.He has a job In which he sits and works on Anim[employer] also contributing to his over flexed posture. Pt would benefit form standing desk and HEP/helps to decrease seated time. Pt will benefit from skilled PT to reduce pain and increase strength and flexibility to return to as neutral as possible. Pt needs to care for special needs son. He is limited due to pain in daily sedentary job and with sleep at night. He desires to return to a more active lifestyle playing racquet sports and golf as able but his sedentary job adds to his flexed and scoliotic posture. Will continue to maximize AROM and strength as able to add more varied leisure activities     OBJECTIVE IMPAIRMENTS: decreased activity tolerance, decreased knowledge of condition, difficulty walking, decreased ROM, decreased strength, increased fascial restrictions, improper body mechanics, postural dysfunction, and obesity.   ACTIVITY LIMITATIONS: carrying, bending, sitting, standing, squatting, sleeping, stairs, locomotion level, and stairs  PARTICIPATION LIMITATIONS: driving, community  activity, occupation, and caring for special needs son  PERSONAL FACTORS: Chronic pain syndrome, hyperlipidemia, HTN, OA DM Type 2, Pain managament. THR R 12 years ago May 2012.  Left shoulder hypermobility and chronic dislocation since basketball days. Surgery of 5th toe infection March 2003. Anxiety are also affecting patient's functional outcome.   REHAB POTENTIAL: Good  CLINICAL DECISION MAKING: Evolving/moderate complexity  EVALUATION COMPLEXITY: Moderate   GOALS:  Goals reviewed with patient? Yes  SHORT TERM GOALS: Target date: 09-14-23  Pt will be independent with initial HEP Baseline:no knowledge Goal status:MET  2.  Pt will work on sleep positions to increase more comfort in order to have at least 5 or more hours of restorative sleep Baseline:  tosses and turns at night and has increased pain Goal status:MET  3.  Demonstrate understanding of  as neutral posture as possible with scoliosis and be more conscious of position and posture throughout the day.  Baseline: no knowledge Goal status: MET  4.  Pain with going up and down steps with maximum safety Baseline: Pt descends steps backwards due to size 16 shoe 09-28-23  Pt continues to descend backwards for safety due to large sixe 16 feet and safety Goal status:MET   LONG TERM GOALS: Target date: 10-05-23 revised  11-16-23  Pt will be independent with advanced HEP using gym equipment and join gym upon DC Baseline: is not a part of gym Goal status: MET  2.  Pt will be able to walk for  25 minutes using pain modification with position and stretching Baseline: Can only walk for 5-8 minutes 10-03-23  10 min  Goal status: DEFERRED due to going for Left THR on 12-08-23   3.  Pt will be able to pick up 30 to 45# KB with proper form for exercise Baseline:  09-26-23  working on 55 # lift bar 11-07-23   55# lift bar Goal status: MET  4.  Pt will be able to improve flexibility in order to try to begin racquet sports /golf  as able.  Baseline: unable to participate now 10-03-23  Close to being able to make a full swing with golf club 11-06-23  Pt has been able to attempt golf before fracturing Left Great toe Goal status:MET 5.  Pain with activity will decrease  to 3/10 instead of 7/10 Baseline: At worse with functional activity 7/10 10-03-23  Pt pain at 4/10 today  11-23-23  2-3/10 Goal status: MET  6.  Pt will simulate golfing/ racquet sports and add strengthening/flexibility exercises that will enhance ability Baseline: Unable to  play sports at present due to spasming 10-03-23  Pt with difficulty turning neck while using gold club.  Close to doing a swing. Goal status: DEFERRED due to schedule for Left THR  7. Pt will investigate bracing for R knee in order to protect knee for trunk and rotational dynamic lunging exercises  Baseline,  R knee with patellar and lateral movement  11-23-23 Pt has been given handout for possible knee bracing if needed,   Goal: MET   PLAN:  PT FREQUENCY: 1-2x/week  PT DURATION: 6 weeks  PLANNED INTERVENTIONS: Therapeutic exercises, Therapeutic activity, Neuromuscular re-education, Balance training, Gait training, Patient/Family education, Self Care, Joint mobilization, Stair training, Dry Needling, Electrical stimulation, Spinal mobilization, Cryotherapy, Moist heat, Taping, Ionotophoresis 4mg /ml Dexamethasone, Manual therapy, and Re-evaluation.  PLAN FOR NEXT SESSION: review/update HEP PRN. Continue with multiplanar rotational activities, hip flexor mobility, working into postural extension.    Work on opening up Left hip. May need a customized knee brace on Right Must do 'GOALS next visit  Garen Lah, PT, [facility] Certified Exercise Expert for the Aging Adult  11/23/23 12:07 PM Phone: [phone] Fax: [phone]

## 2023-11-22 ENCOUNTER — Other Ambulatory Visit: Payer: Self-pay

## 2023-11-22 ENCOUNTER — Encounter: Payer: 59 | Admitting: Physical Therapy

## 2023-11-22 ENCOUNTER — Other Ambulatory Visit (HOSPITAL_COMMUNITY): Payer: Self-pay

## 2023-11-22 NOTE — Progress Notes (Signed)
Sent message, via epic in basket, requesting orders in epic from surgeon.  

## 2023-11-23 ENCOUNTER — Encounter: Payer: Self-pay | Admitting: Physical Therapy

## 2023-11-23 ENCOUNTER — Other Ambulatory Visit (HOSPITAL_COMMUNITY): Payer: Self-pay

## 2023-11-23 ENCOUNTER — Other Ambulatory Visit: Payer: Self-pay

## 2023-11-23 ENCOUNTER — Other Ambulatory Visit (HOSPITAL_BASED_OUTPATIENT_CLINIC_OR_DEPARTMENT_OTHER): Payer: Self-pay

## 2023-11-23 ENCOUNTER — Ambulatory Visit: Payer: 59 | Attending: Neurosurgery | Admitting: Physical Therapy

## 2023-11-23 DIAGNOSIS — G8929 Other chronic pain: Secondary | ICD-10-CM | POA: Diagnosis not present

## 2023-11-23 DIAGNOSIS — M5441 Lumbago with sciatica, right side: Secondary | ICD-10-CM | POA: Diagnosis not present

## 2023-11-23 DIAGNOSIS — R293 Abnormal posture: Secondary | ICD-10-CM | POA: Diagnosis not present

## 2023-11-23 DIAGNOSIS — R2689 Other abnormalities of gait and mobility: Secondary | ICD-10-CM | POA: Insufficient documentation

## 2023-11-24 ENCOUNTER — Other Ambulatory Visit (HOSPITAL_COMMUNITY): Payer: Self-pay

## 2023-11-24 ENCOUNTER — Other Ambulatory Visit: Payer: Self-pay

## 2023-11-24 ENCOUNTER — Encounter (HOSPITAL_COMMUNITY): Payer: Self-pay

## 2023-11-24 DIAGNOSIS — Z79899 Other long term (current) drug therapy: Secondary | ICD-10-CM | POA: Diagnosis not present

## 2023-11-24 MED ORDER — BUPRENORPHINE HCL-NALOXONE HCL 8-2 MG SL FILM
1.0000 | ORAL_FILM | Freq: Two times a day (BID) | SUBLINGUAL | 0 refills | Status: DC
  Filled 2023-11-24: qty 50, 30d supply, fill #0

## 2023-11-28 NOTE — Progress Notes (Signed)
COVID Vaccine received:  []  No [x]  Yes Date of any COVID positive Test in last 90 days:  None  PCP - Johny Blamer, MD at [facility]   Dr. Francisca December  [phone]  [phone]  Chest x-ray - 04-03-2020  2v  Epic EKG -  04-06-2020 Epic   will repeat at PST Stress Test -  ECHO -  Cardiac Cath -   PCR screen: [x]  Ordered & Completed []   No Order but Needs PROFEND     []   N/A for this surgery  Surgery Plan:  []  Ambulatory   [x]  Outpatient in bed  []  Admit Anesthesia:    []  General  [x]  Spinal  []   Choice []   MAC  Pacemaker / ICD device [x]  No []  Yes   Spinal Cord Stimulator:[x]  No []  Yes       History of Sleep Apnea? [x]  No []  Yes   CPAP used?- [x]  No []  Yes    Does the patient monitor blood sugar?   []  N/A   [x]  No []  Yes  Patient has: []  NO Hx DM   []  Pre-DM   []  DM1  [x]   DM2 Last A1c was: 7.0  on   07-31-23  note in [facility]    GLP1 agonist / usual dose - Ozempic  injection on  Thursday    Last injection: 11-30-2023 GLP1 instructions: Hold x 1 week prior to surgery Diabetic medications/ instructions: Metformin 1000mg  bid,    Hold DOS  Blood Thinner / Instructions:  None Aspirin Instructions: none  ERAS Protocol Ordered: []  No  [x]  Yes PRE-SURGERY []  ENSURE  [x]  G2   Patient is to be NPO after: 0930  Dental hx: []  Dentures:  [x]  N/A      []  Bridge or Partial:                   []  Loose or Damaged teeth:   Comments: Patient was given the 5 CHG shower / bath instructions for THA surgery along with 2 bottles of the CHG soap. Patient will start this on: 12-04-2023  All questions were asked and answered, Patient voiced understanding of this process.   Activity level: Patient is able to climb a flight of stairs without difficulty; [x]  No CP  [x]  No SOB, but would have Leg Pain.  Patient can perform ADLs without assistance.   Anesthesia review: MDD, HTN,  DM2  CPS- on Suboxone,  per Shanda Bumps Ward  patient will call [facility]  to see if they will transition him to something else a few days prior to surgery . I called Sherri at Dr. Eliberto Ivory and made her aware of this so they can do postop pain control,  Patient denies shortness of breath, fever, cough and chest pain at PAT appointment.  Patient verbalized understanding and agreement to the Pre-Surgical Instructions that were given to them at this PAT appointment. Patient was also educated of the need to review these PAT instructions again prior to his surgery.I reviewed the appropriate phone numbers to call if they have any and questions or concerns.

## 2023-11-28 NOTE — Patient Instructions (Addendum)
SURGICAL WAITING ROOM VISITATION Patients having surgery or a procedure may have no more than 2 support people in the waiting area - these visitors may rotate in the visitor waiting room.   Due to an increase in RSV and influenza rates and associated hospitalizations, children ages 29 and under may not visit patients in [facility] hospitals. If the patient needs to stay at the hospital during part of their recovery, the visitor guidelines for inpatient rooms apply.  PRE-OP VISITATION  Pre-op nurse will coordinate an appropriate time for 1 support person to accompany the patient in pre-op.  This support person may not rotate.  This visitor will be contacted when the time is appropriate for the visitor to come back in the pre-op area.  Please refer to the [facility] website for the visitor guidelines for Inpatients (after your surgery is over and you are in a regular room).  You are not required to quarantine at this time prior to your surgery. However, you must do this: Hand Hygiene often Do NOT share personal items Notify your provider if you are in close contact with someone who has COVID or you develop fever 100.4 or greater, new onset of sneezing, cough, sore throat, shortness of breath or body aches.  If you test positive for Covid or have been in contact with anyone that has tested positive in the last 10 days please notify you surgeon.    Your procedure is scheduled on:  Friday  December 08, 2023  Report to [facility] Main Entrance: [place] entrance where the [employer] is available.   Report to admitting at: 10:00    AM  Call this number if you have any questions or problems the morning of surgery [phone]  Do not eat food after Midnight the night prior to your surgery/procedure.  After Midnight you may have the following liquids until   09:30 AM DAY OF SURGERY  Clear Liquid Diet Water Black Coffee (sugar ok, NO MILK/CREAM OR CREAMERS)  Tea (sugar ok, NO  MILK/CREAM OR CREAMERS) regular and decaf                             Plain Jell-O  with no fruit (NO RED)                                           Fruit ices (not with fruit pulp, NO RED)                                     Popsicles (NO RED)                                                                  Juice: NO CITRUS JUICES: only apple, WHITE grape, WHITE cranberry Sports drinks like Gatorade or Powerade (NO RED)                    The day of surgery:  Drink ONE (1) Pre-Surgery G2 at   09:30  AM the morning of surgery. Drink in one sitting. Do not sip.  This drink was given to you during your hospital pre-op appointment visit. Nothing else to drink after completing the Pre-Surgery G2 : No candy, chewing gum or throat lozenges.    FOLLOW ANY ADDITIONAL PRE OP INSTRUCTIONS YOU RECEIVED FROM YOUR SURGEON'S OFFICE!!!   Oral Hygiene is also important to reduce your risk of infection.        Remember - BRUSH YOUR TEETH THE MORNING OF SURGERY WITH YOUR REGULAR TOOTHPASTE  Do NOT smoke after Midnight the night before surgery.  STOP TAKING SUBOXONE according to Dr. Francisca December   Diabetic Medication:    Ozempic  injection on Thursday    DO NOT INJECT  x 1 week prior to surgery  Last Injection will be:  11-30-23  Metformin 1000mg  bid,    DO NOT TAKE METFORMIN THE DAY OF SURGERY   STOP TAKING all Vitamins, Herbs and supplements 1 week before your surgery.   Take ONLY these medicines the morning of surgery with A SIP OF WATER: Gabapentin, Bupropion (Wellbutrin), Vilazodone (VIIBRYD), Metoprolol, levothyroxine.  You may take Tylenol if needed for pain. You may take Alprazolam if needed for anxiety.                    You may not have any metal on your body including  jewelry, and body piercing  Do not wear, lotions, powders, cologne, or deodorant  Men may shave face and neck.  Contacts, Hearing Aids, dentures or bridgework may not be worn into surgery. DENTURES WILL BE  REMOVED PRIOR TO SURGERY PLEASE DO NOT APPLY "Poly grip" OR ADHESIVES!!!  You may bring a small overnight bag with you on the day of surgery, only pack items that are not valuable. [facility] IS NOT RESPONSIBLE   FOR VALUABLES THAT ARE LOST OR STOLEN.    Do not bring your home medications to the hospital. The Pharmacy will dispense medications listed on your medication list to you during your admission in the Hospital.  Special Instructions: Bring a copy of your healthcare power of attorney and living will documents the day of surgery, if you wish to have them scanned into your [place] [facility] Records- EPIC  Please read over the following fact sheets you were given: IF YOU HAVE QUESTIONS ABOUT YOUR PRE-OP INSTRUCTIONS, PLEASE CALL [phone].     Pre-operative 5 CHG Bath Instructions   You can play a key role in reducing the risk of infection after surgery. Your skin needs to be as free of germs as possible. You can reduce the number of germs on your skin by washing with CHG (chlorhexidine gluconate) soap before surgery. CHG is an antiseptic soap that kills germs and continues to kill germs even after washing.   DO NOT use if you have an allergy to chlorhexidine/CHG or antibacterial soaps. If your skin becomes reddened or irritated, stop using the CHG and notify one of our RNs at [phone]  Please shower with the CHG soap starting 4 days before surgery using the following schedule: START SHOWERS ON MONDAY  December 04, 2023  Please keep in mind the following:  DO NOT shave, including legs and underarms, starting the day of your first shower.   You may shave your face at any point before/day of surgery.   Place clean sheets on your bed the day you start using CHG soap. Use a clean washcloth (not used since  being washed) for each shower. DO NOT sleep with pets once you start using the CHG.   CHG Shower Instructions:  If you choose to wash your hair and private area, wash first with your normal shampoo/soap.  After you use shampoo/soap, rinse your hair and body thoroughly to remove shampoo/soap residue.  Turn the water OFF and apply about 3 tablespoons (45 ml) of CHG soap to a CLEAN washcloth.  Apply CHG soap ONLY FROM YOUR NECK DOWN TO YOUR TOES (washing for 3-5 minutes)  DO NOT use CHG soap on face, private areas, open wounds, or sores.  Pay special attention to the area where your surgery is being performed.  If you are having back surgery, having someone wash your back for you may be helpful.  Wait 2 minutes after CHG soap is applied, then you may rinse off the CHG soap.  Pat dry with a clean towel  Put on clean clothes/pajamas   If you choose to wear lotion, please use ONLY the CHG-compatible lotions on the back of this paper.     Additional instructions for the day of surgery: DO NOT APPLY any lotions, deodorants, cologne, or perfumes.   Put on clean/comfortable clothes.  Brush your teeth.  Ask your nurse before applying any prescription medications to the skin.      CHG Compatible Lotions   Aveeno Moisturizing lotion  Cetaphil Moisturizing Cream  Cetaphil Moisturizing Lotion  Clairol Herbal Essence Moisturizing Lotion, Dry Skin  Clairol Herbal Essence Moisturizing Lotion, Extra Dry Skin  Clairol Herbal Essence Moisturizing Lotion, Normal Skin  Curel Age Defying Therapeutic Moisturizing Lotion with Alpha Hydroxy  Curel Extreme Care Body Lotion  Curel Soothing Hands Moisturizing Hand Lotion  Curel Therapeutic Moisturizing Cream, Fragrance-Free  Curel Therapeutic Moisturizing Lotion, Fragrance-Free  Curel Therapeutic Moisturizing Lotion, Original Formula  Eucerin Daily Replenishing Lotion  Eucerin Dry Skin Therapy Plus Alpha Hydroxy Crme  Eucerin Dry Skin Therapy Plus  Alpha Hydroxy Lotion  Eucerin Original Crme  Eucerin Original Lotion  Eucerin Plus Crme Eucerin Plus Lotion  Eucerin TriLipid Replenishing Lotion  Keri Anti-Bacterial Hand Lotion  Keri Deep Conditioning Original Lotion Dry Skin Formula Softly Scented  Keri Deep Conditioning Original Lotion, Fragrance Free Sensitive Skin Formula  Keri Lotion Fast Absorbing Fragrance Free Sensitive Skin Formula  Keri Lotion Fast Absorbing Softly Scented Dry Skin Formula  Keri Original Lotion  Keri Skin Renewal Lotion Keri Silky Smooth Lotion  Keri Silky Smooth Sensitive Skin Lotion  Nivea Body Creamy Conditioning Oil  Nivea Body Extra Enriched Lotion  Nivea Body Original Lotion  Nivea Body Sheer Moisturizing Lotion Nivea Crme  Nivea Skin Firming Lotion  NutraDerm 30 Skin Lotion  NutraDerm Skin Lotion  NutraDerm Therapeutic Skin Cream  NutraDerm Therapeutic Skin Lotion  ProShield Protective Hand Cream  Provon moisturizing lotion   FAILURE TO FOLLOW THESE INSTRUCTIONS MAY RESULT IN THE CANCELLATION OF YOUR SURGERY  PATIENT SIGNATURE_________________________________  NURSE SIGNATURE__________________________________  ________________________________________________________________________     Brandon Gordon    An incentive spirometer is a tool that can help keep your lungs clear and active. This tool measures how well you are filling your lungs with each breath. Taking long deep  breaths may help reverse or decrease the chance of developing breathing (pulmonary) problems (especially infection) following: A long period of time when you are unable to move or be active. BEFORE THE PROCEDURE  If the spirometer includes an indicator to show your best effort, your nurse or respiratory therapist will set it to a desired goal. If possible, sit up straight or lean slightly forward. Try not to slouch. Hold the incentive spirometer in an upright position. INSTRUCTIONS FOR USE  Sit on the edge  of your bed if possible, or sit up as far as you can in bed or on a chair. Hold the incentive spirometer in an upright position. Breathe out normally. Place the mouthpiece in your mouth and seal your lips tightly around it. Breathe in slowly and as deeply as possible, raising the piston or the ball toward the top of the column. Hold your breath for 3-5 seconds or for as long as possible. Allow the piston or ball to fall to the bottom of the column. Remove the mouthpiece from your mouth and breathe out normally. Rest for a few seconds and repeat Steps 1 through 7 at least 10 times every 1-2 hours when you are awake. Take your time and take a few normal breaths between deep breaths. The spirometer may include an indicator to show your best effort. Use the indicator as a goal to work toward during each repetition. After each set of 10 deep breaths, practice coughing to be sure your lungs are clear. If you have an incision (the cut made at the time of surgery), support your incision when coughing by placing a pillow or rolled up towels firmly against it. Once you are able to get out of bed, walk around indoors and cough well. You may stop using the incentive spirometer when instructed by your caregiver.  RISKS AND COMPLICATIONS Take your time so you do not get dizzy or light-headed. If you are in pain, you may need to take or ask for pain medication before doing incentive spirometry. It is harder to take a deep breath if you are having pain. AFTER USE Rest and breathe slowly and easily. It can be helpful to keep track of a log of your progress. Your caregiver can provide you with a simple table to help with this. If you are using the spirometer at home, follow these instructions: SEEK MEDICAL CARE IF:  You are having difficultly using the spirometer. You have trouble using the spirometer as often as instructed. Your pain medication is not giving enough relief while using the spirometer. You develop  fever of 100.5 F (38.1 C) or higher.                                                                                                    SEEK IMMEDIATE MEDICAL CARE IF:  You cough up bloody sputum that had not been present before. You develop fever of 102 F (38.9 C) or greater. You develop worsening pain at or near the incision site. MAKE SURE YOU:  Understand these instructions. Will watch your condition. Will get  help right away if you are not doing well or get worse. Document Released: 04/17/2007 Document Revised: 02/27/2012 Document Reviewed: 06/18/2007 [facility] Patient Information 2014 [place], [place].      WHAT IS A BLOOD TRANSFUSION? Blood Transfusion Information  A transfusion is the replacement of blood or some of its parts. Blood is made up of multiple cells which provide different functions. Red blood cells carry oxygen and are used for blood loss replacement. White blood cells fight against infection. Platelets control bleeding. Plasma helps clot blood. Other blood products are available for specialized needs, such as hemophilia or other clotting disorders. BEFORE THE TRANSFUSION  Who gives blood for transfusions?  Healthy volunteers who are fully evaluated to make sure their blood is safe. This is blood bank blood. Transfusion therapy is the safest it has ever been in the practice of medicine. Before blood is taken from a donor, a complete history is taken to make sure that person has no history of diseases nor engages in risky social behavior (examples are intravenous drug use or sexual activity with multiple partners). The donor's travel history is screened to minimize risk of transmitting infections, such as malaria. The donated blood is tested for signs of infectious diseases, such as HIV and hepatitis. The blood is then tested to be sure it is compatible with you in order to minimize the chance of a transfusion reaction. If you or a relative donates blood, this is  often done in anticipation of surgery and is not appropriate for emergency situations. It takes many days to process the donated blood. RISKS AND COMPLICATIONS Although transfusion therapy is very safe and saves many lives, the main dangers of transfusion include:  Getting an infectious disease. Developing a transfusion reaction. This is an allergic reaction to something in the blood you were given. Every precaution is taken to prevent this. The decision to have a blood transfusion has been considered carefully by your caregiver before blood is given. Blood is not given unless the benefits outweigh the risks. AFTER THE TRANSFUSION Right after receiving a blood transfusion, you will usually feel much better and more energetic. This is especially true if your red blood cells have gotten low (anemic). The transfusion raises the level of the red blood cells which carry oxygen, and this usually causes an energy increase. The nurse administering the transfusion will monitor you carefully for complications. HOME CARE INSTRUCTIONS  No special instructions are needed after a transfusion. You may find your energy is better. Speak with your caregiver about any limitations on activity for underlying diseases you may have. SEEK MEDICAL CARE IF:  Your condition is not improving after your transfusion. You develop redness or irritation at the intravenous (IV) site. SEEK IMMEDIATE MEDICAL CARE IF:  Any of the following symptoms occur over the next 12 hours: Shaking chills. You have a temperature by mouth above 102 F (38.9 C), not controlled by medicine. Chest, back, or muscle pain. People around you feel you are not acting correctly or are confused. Shortness of breath or difficulty breathing. Dizziness and fainting. You get a rash or develop hives. You have a decrease in urine output. Your urine turns a dark color or changes to pink, red, or brown. Any of the following symptoms occur over the next 10  days: You have a temperature by mouth above 102 F (38.9 C), not controlled by medicine. Shortness of breath. Weakness after normal activity. The white part of the eye turns yellow (jaundice). You have a decrease in  the amount of urine or are urinating less often. Your urine turns a dark color or changes to pink, red, or brown. Document Released: 12/02/2000 Document Revised: 02/27/2012 Document Reviewed: 07/21/2008 [facility] Patient Information 2014 [place], [place].  _______________________________________________________________________

## 2023-11-29 ENCOUNTER — Encounter (HOSPITAL_COMMUNITY): Payer: Self-pay

## 2023-11-29 ENCOUNTER — Other Ambulatory Visit: Payer: Self-pay

## 2023-11-29 ENCOUNTER — Telehealth: Payer: Self-pay

## 2023-11-29 ENCOUNTER — Encounter (HOSPITAL_COMMUNITY)
Admission: RE | Admit: 2023-11-29 | Discharge: 2023-11-29 | Disposition: A | Discharge status: Home / Self Care | Payer: 59 | Source: Ambulatory Visit | Attending: Orthopaedic Surgery | Admitting: Orthopaedic Surgery

## 2023-11-29 VITALS — BP 123/88 | HR 74 | Temp 98.9°F | Resp 18 | Ht 78.0 in | Wt 245.0 lb

## 2023-11-29 DIAGNOSIS — Z01818 Encounter for other preprocedural examination: Secondary | ICD-10-CM | POA: Diagnosis present

## 2023-11-29 DIAGNOSIS — E119 Type 2 diabetes mellitus without complications: Secondary | ICD-10-CM | POA: Diagnosis not present

## 2023-11-29 DIAGNOSIS — Z79899 Other long term (current) drug therapy: Secondary | ICD-10-CM

## 2023-11-29 DIAGNOSIS — M1612 Unilateral primary osteoarthritis, left hip: Secondary | ICD-10-CM | POA: Diagnosis not present

## 2023-11-29 LAB — CBC
HCT: 38.1 % — ABNORMAL LOW (ref 39.0–52.0)
Hemoglobin: 12.4 g/dL — ABNORMAL LOW (ref 13.0–17.0)
MCH: 27.8 pg (ref 26.0–34.0)
MCHC: 32.5 g/dL (ref 30.0–36.0)
MCV: 85.4 fL (ref 80.0–100.0)
Platelets: 329 10*3/uL (ref 150–400)
RBC: 4.46 MIL/uL (ref 4.22–5.81)
RDW: 12.6 % (ref 11.5–15.5)
WBC: 6.8 10*3/uL (ref 4.0–10.5)
nRBC: 0 % (ref 0.0–0.2)

## 2023-11-29 LAB — COMPREHENSIVE METABOLIC PANEL
ALT: 17 U/L (ref 0–44)
AST: 19 U/L (ref 15–41)
Albumin: 4.2 g/dL (ref 3.5–5.0)
Alkaline Phosphatase: 109 U/L (ref 38–126)
Anion gap: 9 (ref 5–15)
BUN: 25 mg/dL — ABNORMAL HIGH (ref 6–20)
CO2: 27 mmol/L (ref 22–32)
Calcium: 9.3 mg/dL (ref 8.9–10.3)
Chloride: 102 mmol/L (ref 98–111)
Creatinine, Ser: 1.43 mg/dL — ABNORMAL HIGH (ref 0.61–1.24)
GFR, Estimated: 59 mL/min — ABNORMAL LOW (ref >60)
Glucose, Bld: 147 mg/dL — ABNORMAL HIGH (ref 70–99)
Potassium: 5.3 mmol/L — ABNORMAL HIGH (ref 3.5–5.1)
Sodium: 138 mmol/L (ref 135–145)
Total Bilirubin: 0.5 mg/dL (ref <1.2)
Total Protein: 7.6 g/dL (ref 6.5–8.1)

## 2023-11-29 LAB — GLUCOSE, CAPILLARY: Glucose-Capillary: 131 mg/dL — ABNORMAL HIGH (ref 70–99)

## 2023-11-29 LAB — SURGICAL PCR SCREEN
MRSA, PCR: NEGATIVE
Staphylococcus aureus: POSITIVE — AB

## 2023-11-29 LAB — HEMOGLOBIN A1C
Hgb A1c MFr Bld: 7.5 % — ABNORMAL HIGH (ref 4.8–5.6)
Mean Plasma Glucose: 168.55 mg/dL

## 2023-11-29 NOTE — Telephone Encounter (Signed)
Patient is scheduled for THA on 12/20.  He takes Suboxone thru [facility].  He was advised to stop before surgery and inform the clinic about his upcoming surgery.

## 2023-11-30 ENCOUNTER — Ambulatory Visit: Payer: 59 | Admitting: Physical Therapy

## 2023-11-30 NOTE — Progress Notes (Signed)
Patient's PCR screen is positive for  [facility]. Appropriate notes have been placed on the patient's chart. This note has been routed to Dr.Blackman and Waldo Laine  for review. The Patient's surgery is currently scheduled for: 12-08-2023 at [facility].  Rudean Haskell, BSN, CVRN-BC   Pre-Surgical Testing Nurse [facility]  [phone]

## 2023-12-04 ENCOUNTER — Other Ambulatory Visit (HOSPITAL_COMMUNITY): Payer: Self-pay

## 2023-12-07 NOTE — H&P (Signed)
TOTAL HIP ADMISSION H&P  Patient is admitted for left total hip arthroplasty.  Subjective:  Chief Complaint: left hip pain  HPI: Brandon Gordon, 52 y.o. male, has a history of pain and functional disability in the left hip(s) due to arthritis and patient has failed non-surgical conservative treatments for greater than 12 weeks to include NSAID's and/or analgesics, weight reduction as appropriate, and activity modification.  Onset of symptoms was gradual starting 2 years ago with gradually worsening course since that time.The patient noted no past surgery on the left hip(s).  Patient currently rates pain in the left hip at 10 out of 10 with activity. Patient has night pain, worsening of pain with activity and weight bearing, trendelenberg gait, pain that interfers with activities of daily living, and pain with passive range of motion. Patient has evidence of subchondral cysts, subchondral sclerosis, periarticular osteophytes, and joint space narrowing by imaging studies. This condition presents safety issues increasing the risk of falls.  There is no current active infection.  Patient Active Problem List   Diagnosis Date Noted   Unilateral primary osteoarthritis, left hip 10/30/2023   Past Medical History:  Diagnosis Date   Anxiety    At risk for sleep apnea    STOP-BANG= 5      SENT TO PCP 04-05-2016   Chronic pain syndrome    History of kidney stones    Hyperlipidemia    Hypertension    Left ureteral stone    OA (osteoarthritis)    back, hip   Pain management    Type 2 diabetes mellitus (HCC)     Past Surgical History:  Procedure Laterality Date   TOE SURGERY Left    left 5th toe bone excision   TOTAL HIP ARTHROPLASTY Right 04/22/2011    No current facility-administered medications for this encounter.   Current Outpatient Medications  Medication Sig Dispense Refill Last Dose/Taking   acetaminophen (TYLENOL) 325 MG tablet Take 650 mg by mouth every 6 (six) hours as needed for  moderate pain or mild pain.   Taking As Needed   ALPRAZolam (XANAX) 1 MG tablet Take 2 tablets (2 mg total) by mouth daily as needed. 60 tablet 3 Taking As Needed   atorvastatin (LIPITOR) 40 MG tablet Take 1 tablet (40 mg total) by mouth daily. 90 tablet 2 Taking   Buprenorphine HCl-Naloxone HCl (SUBOXONE) 8-2 MG FILM Take .75 to 1 film under the tongue as directed, up to two times daily. Max of 2 films/day. 50 Film 0 Taking   buPROPion (WELLBUTRIN XL) 300 MG 24 hr tablet Take 1 tablet (300 mg total) by mouth in the morning. 90 tablet 1 Taking   gabapentin (NEURONTIN) 600 MG tablet Take 1 tablet (600 mg total) by mouth 4 (four) times daily. 360 tablet 1 Taking   ibuprofen (ADVIL) 200 MG tablet Take 400 mg by mouth every 6 (six) hours as needed for moderate pain (pain score 4-6).   Taking As Needed   levothyroxine (SYNTHROID) 25 MCG tablet Take 1 tablet (25 mcg total) by mouth daily in the morning on an empty stomach. 30 tablet 11 Taking   losartan (COZAAR) 25 MG tablet Take 1 tablet (25 mg total) by mouth daily. 90 tablet 1 Taking   metFORMIN (GLUCOPHAGE-XR) 500 MG 24 hr tablet Take 2 tablets (1,000 mg total) by mouth 2 (two) times daily. 360 tablet 3 Taking   metoprolol succinate (TOPROL-XL) 100 MG 24 hr tablet Take 1 tablet (100 mg total) by mouth daily. 90  tablet 0 Taking   Semaglutide, 1 MG/DOSE, (OZEMPIC, 1 MG/DOSE,) 4 MG/3ML SOPN Inject 1 mg into the skin once a week. 3 mL 5 Taking   testosterone cypionate (DEPOTESTOSTERONE CYPIONATE) 200 MG/ML injection Inject 200 mg into the skin every 30 (thirty) days.   Taking   Vilazodone HCl (VIIBRYD) 40 MG TABS Take 1 tablet (40 mg total) by mouth daily with food 90 tablet 0 Taking   Vitamin D, Ergocalciferol, (DRISDOL) 1.25 MG (50000 UNIT) CAPS capsule Take 50,000 Units by mouth once a week.   Taking   ALPRAZolam (XANAX) 1 MG tablet Take 2 tablets (2 mg total) by mouth 2 (two) times daily as needed. (Patient not taking: Reported on 11/28/2023) 120 tablet  3 Not Taking   atorvastatin (LIPITOR) 40 MG tablet Take 1 tablet (40 mg total) by mouth daily. (Patient not taking: Reported on 11/28/2023) 90 tablet 2 Not Taking   Buprenorphine HCl-Naloxone HCl (SUBOXONE) 8-2 MG FILM Place 1 Film under the tongue 2 (two) times daily. (max daily dose 2 films) (Patient not taking: Reported on 11/28/2023) 60 each 0 Not Taking   Buprenorphine HCl-Naloxone HCl (SUBOXONE) 8-2 MG FILM Place 3/4-1 Film under the tongue up to 2 (two) times daily as directed. Max daily dose: 2 Film (Patient not taking: Reported on 08/24/2023) 60 each 0 Not Taking   Buprenorphine HCl-Naloxone HCl (SUBOXONE) 8-2 MG FILM Place 3/4 - 1 Film under tongue two times daily. max daily dose: 2 Films (Patient not taking: Reported on 08/24/2023) 60 Film 0 Not Taking   Buprenorphine HCl-Naloxone HCl (SUBOXONE) 8-2 MG FILM Place 3/4 to 1 Film under the tongue up to 2 (two) times daily as directed. max daily dose: 2 Film (Patient not taking: Reported on 08/24/2023) 60 Film 0 Not Taking   Buprenorphine HCl-Naloxone HCl (SUBOXONE) 8-2 MG FILM Place 3/4 - 1 Film under the tongue up to 2 (two) times daily. (Patient not taking: Reported on 11/28/2023) 60 Film 0 Not Taking   buPROPion (WELLBUTRIN XL) 300 MG 24 hr tablet TAKE 1 TABLET BY MOUTH ONCE DAILY (Patient not taking: Reported on 11/28/2023) 90 tablet 3 Not Taking   buPROPion (WELLBUTRIN XL) 300 MG 24 hr tablet Take 1 tablet (300 mg total) by mouth in the morning. (Patient not taking: Reported on 11/28/2023) 90 tablet 0 Not Taking   buPROPion (WELLBUTRIN XL) 300 MG 24 hr tablet Take 1 tablet (300 mg total) by mouth in the morning. (Patient not taking: Reported on 08/24/2023) 90 tablet 1 Not Taking   clindamycin (CLEOCIN) 300 MG capsule Take 1 capsule (300 mg total) by mouth 2 (two) times daily. (Patient not taking: Reported on 11/28/2023) 14 capsule 0 Not Taking   clindamycin (CLEOCIN) 300 MG capsule Take 1 capsule (300 mg total) by mouth 2 (two) times daily. (Patient  not taking: Reported on 08/24/2023) 14 capsule 0 Not Taking   COVID-19 At Home Antigen Test (CARESTART COVID-19 HOME TEST) KIT Use as directed 4 each 0    gabapentin (NEURONTIN) 600 MG tablet Take 1 tablet (600 mg total) by mouth 4 (four) times daily. (Patient not taking: Reported on 11/28/2023) 360 tablet 1 Not Taking   losartan (COZAAR) 25 MG tablet Take 1 tablet (25 mg total) by mouth daily. (Patient not taking: Reported on 11/28/2023) 90 tablet 1 Not Taking   metFORMIN (GLUCOPHAGE-XR) 500 MG 24 hr tablet Take 2 tablets (1,000 mg total) by mouth 2 (two) times daily. (Patient not taking: Reported on 11/28/2023) 120 tablet 5 Not Taking  naloxone (NARCAN) nasal spray 4 mg/0.1 mL Place 1 spray into one nostril every 2 minutes as needed for opioid overdose;  alternate nostrils w each dose until help arrives (Patient not taking: Reported on 11/28/2023) 2 each 3 Not Taking   Semaglutide, 1 MG/DOSE, (OZEMPIC, 1 MG/DOSE,) 4 MG/3ML SOPN Inject 1 mg into the skin once a week. (Patient not taking: Reported on 11/28/2023) 3 mL 5 Not Taking   Semaglutide,0.25 or 0.5MG /DOS, (OZEMPIC, 0.25 OR 0.5 MG/DOSE,) 2 MG/3ML SOPN Inject 0.5 mg into the skin once a week. (Patient not taking: Reported on 11/28/2023) 3 mL 5 Not Taking   Semaglutide,0.25 or 0.5MG /DOS, (OZEMPIC, 0.25 OR 0.5 MG/DOSE,) 2 MG/3ML SOPN Inject 0.25 mg into the skin once a week. (Patient not taking: Reported on 11/28/2023) 3 mL 5 Not Taking   tadalafil (CIALIS) 20 MG tablet Take 20 mg by mouth daily as needed for erectile dysfunction.      Allergies  Allergen Reactions   Cephalexin     Other reaction(s): stomach pain   Lexapro [Escitalopram Oxalate] Other (See Comments)    Sexual disfunction   Victoza [Liraglutide] Nausea Only    Social History   Tobacco Use   Smoking status: Never   Smokeless tobacco: Never  Substance Use Topics   Alcohol use: Not Currently    No family history on file.   Review of Systems  Objective:  Physical  Exam Vitals reviewed.  Constitutional:      Appearance: Normal appearance.  HENT:     Head: Normocephalic and atraumatic.  Eyes:     Extraocular Movements: Extraocular movements intact.     Pupils: Pupils are equal, round, and reactive to light.  Cardiovascular:     Rate and Rhythm: Normal rate and regular rhythm.     Pulses: Normal pulses.  Pulmonary:     Effort: Pulmonary effort is normal.  Abdominal:     Palpations: Abdomen is soft.  Musculoskeletal:     Cervical back: Normal range of motion and neck supple.     Left hip: Tenderness and bony tenderness present. Decreased range of motion. Decreased strength.  Neurological:     Mental Status: He is alert and oriented to person, place, and time.  Psychiatric:        Behavior: Behavior normal.     Vital signs in last 24 hours:    Labs:   Estimated body mass index is 28.31 kg/m as calculated from the following:   Height as of 11/29/23: 6\' 6"  (1.981 m).   Weight as of 11/29/23: 111.1 kg.   Imaging Review Plain radiographs demonstrate severe degenerative joint disease of the left hip(s). The bone quality appears to be excellent for age and reported activity level.      Assessment/Plan:  End stage arthritis, left hip(s)  The patient history, physical examination, clinical judgement of the provider and imaging studies are consistent with end stage degenerative joint disease of the left hip(s) and total hip arthroplasty is deemed medically necessary. The treatment options including medical management, injection therapy, arthroscopy and arthroplasty were discussed at length. The risks and benefits of total hip arthroplasty were presented and reviewed. The risks due to aseptic loosening, infection, stiffness, dislocation/subluxation,  thromboembolic complications and other imponderables were discussed.  The patient acknowledged the explanation, agreed to proceed with the plan and consent was signed. Patient is being admitted  for inpatient treatment for surgery, pain control, PT, OT, prophylactic antibiotics, VTE prophylaxis, progressive ambulation and ADL's and discharge planning.The patient is  planning to be discharged home with home health services

## 2023-12-08 ENCOUNTER — Ambulatory Visit (HOSPITAL_COMMUNITY): Payer: 59

## 2023-12-08 ENCOUNTER — Ambulatory Visit (HOSPITAL_COMMUNITY): Payer: 59 | Admitting: Physician Assistant

## 2023-12-08 ENCOUNTER — Other Ambulatory Visit (HOSPITAL_COMMUNITY): Payer: Self-pay

## 2023-12-08 ENCOUNTER — Ambulatory Visit (HOSPITAL_COMMUNITY): Payer: 59 | Admitting: Anesthesiology

## 2023-12-08 ENCOUNTER — Observation Stay (HOSPITAL_COMMUNITY)
Admission: RE | Admit: 2023-12-08 | Discharge: 2023-12-09 | Disposition: A | Discharge status: Home / Self Care | Payer: 59 | Attending: Orthopaedic Surgery | Admitting: Orthopaedic Surgery

## 2023-12-08 ENCOUNTER — Encounter (HOSPITAL_COMMUNITY)
Admission: RE | Disposition: A | Discharge status: Home / Self Care | Payer: Self-pay | Source: Home / Self Care | Attending: Orthopaedic Surgery

## 2023-12-08 ENCOUNTER — Observation Stay (HOSPITAL_COMMUNITY): Payer: 59

## 2023-12-08 ENCOUNTER — Encounter (HOSPITAL_COMMUNITY): Payer: Self-pay | Admitting: Orthopaedic Surgery

## 2023-12-08 ENCOUNTER — Other Ambulatory Visit: Payer: Self-pay

## 2023-12-08 DIAGNOSIS — Z96641 Presence of right artificial hip joint: Secondary | ICD-10-CM | POA: Diagnosis not present

## 2023-12-08 DIAGNOSIS — M1612 Unilateral primary osteoarthritis, left hip: Secondary | ICD-10-CM | POA: Diagnosis not present

## 2023-12-08 DIAGNOSIS — Z79899 Other long term (current) drug therapy: Secondary | ICD-10-CM | POA: Insufficient documentation

## 2023-12-08 DIAGNOSIS — E119 Type 2 diabetes mellitus without complications: Secondary | ICD-10-CM | POA: Diagnosis not present

## 2023-12-08 DIAGNOSIS — I10 Essential (primary) hypertension: Secondary | ICD-10-CM | POA: Diagnosis not present

## 2023-12-08 DIAGNOSIS — Z7984 Long term (current) use of oral hypoglycemic drugs: Secondary | ICD-10-CM | POA: Diagnosis not present

## 2023-12-08 DIAGNOSIS — Z96642 Presence of left artificial hip joint: Secondary | ICD-10-CM

## 2023-12-08 DIAGNOSIS — Z01818 Encounter for other preprocedural examination: Secondary | ICD-10-CM

## 2023-12-08 HISTORY — PX: TOTAL HIP ARTHROPLASTY: SHX124

## 2023-12-08 LAB — TYPE AND SCREEN
ABO/RH(D): A NEG
Antibody Screen: NEGATIVE

## 2023-12-08 LAB — GLUCOSE, CAPILLARY
Glucose-Capillary: 160 mg/dL — ABNORMAL HIGH (ref 70–99)
Glucose-Capillary: 155 mg/dL — ABNORMAL HIGH (ref 70–99)

## 2023-12-08 LAB — BASIC METABOLIC PANEL
Anion gap: 8 (ref 5–15)
BUN: 28 mg/dL — ABNORMAL HIGH (ref 6–20)
CO2: 25 mmol/L (ref 22–32)
Calcium: 8.8 mg/dL — ABNORMAL LOW (ref 8.9–10.3)
Chloride: 106 mmol/L (ref 98–111)
Creatinine, Ser: 1.43 mg/dL — ABNORMAL HIGH (ref 0.61–1.24)
GFR, Estimated: 59 mL/min — ABNORMAL LOW (ref >60)
Glucose, Bld: 197 mg/dL — ABNORMAL HIGH (ref 70–99)
Potassium: 5.2 mmol/L — ABNORMAL HIGH (ref 3.5–5.1)
Sodium: 139 mmol/L (ref 135–145)

## 2023-12-08 SURGERY — ARTHROPLASTY, HIP, TOTAL, ANTERIOR APPROACH
Anesthesia: Spinal | Site: Hip | Laterality: Left

## 2023-12-08 MED ORDER — ACETAMINOPHEN 500 MG PO TABS
1000.0000 mg | ORAL_TABLET | Freq: Once | ORAL | Status: AC
Start: 2023-12-08 — End: 2023-12-08
  Administered 2023-12-08: 1000 mg via ORAL
  Filled 2023-12-08: qty 2

## 2023-12-08 MED ORDER — LEVOTHYROXINE SODIUM 25 MCG PO TABS
25.0000 ug | ORAL_TABLET | Freq: Every day | ORAL | Status: DC
  Administered 2023-12-09: 25 ug via ORAL
  Filled 2023-12-08: qty 1

## 2023-12-08 MED ORDER — MUPIROCIN 2 % EX OINT
1.0000 | TOPICAL_OINTMENT | Freq: Two times a day (BID) | CUTANEOUS | 1 refills | Status: DC
  Filled 2023-12-08: qty 66, 30d supply, fill #0

## 2023-12-08 MED ORDER — METFORMIN HCL ER 500 MG PO TB24
1000.0000 mg | ORAL_TABLET | Freq: Two times a day (BID) | ORAL | Status: DC
  Administered 2023-12-09: 1000 mg via ORAL
  Filled 2023-12-08: qty 2

## 2023-12-08 MED ORDER — VITAMIN D (ERGOCALCIFEROL) 1.25 MG (50000 UNIT) PO CAPS
50000.0000 [IU] | ORAL_CAPSULE | ORAL | Status: DC
  Administered 2023-12-09: 50000 [IU] via ORAL
  Filled 2023-12-08: qty 1

## 2023-12-08 MED ORDER — INSULIN ASPART 100 UNIT/ML IJ SOLN: 0.0000 [IU] | INTRAMUSCULAR | Status: DC | PRN

## 2023-12-08 MED ORDER — VANCOMYCIN HCL 1.5 G IV SOLR
1500.0000 mg | INTRAVENOUS | Status: AC
  Administered 2023-12-08: 1500 mg via INTRAVENOUS
  Filled 2023-12-08: qty 30

## 2023-12-08 MED ORDER — PHENOL 1.4 % MT LIQD: 1.0000 | OROMUCOSAL | Status: DC | PRN

## 2023-12-08 MED ORDER — ONDANSETRON HCL 4 MG/2ML IJ SOLN: 4.0000 mg | Freq: Once | INTRAMUSCULAR | Status: DC | PRN

## 2023-12-08 MED ORDER — DOCUSATE SODIUM 100 MG PO CAPS
100.0000 mg | ORAL_CAPSULE | Freq: Two times a day (BID) | ORAL | Status: DC
  Administered 2023-12-08 – 2023-12-09 (×2): 100 mg via ORAL
  Filled 2023-12-08 (×2): qty 1

## 2023-12-08 MED ORDER — ASPIRIN 81 MG PO CHEW
81.0000 mg | CHEWABLE_TABLET | Freq: Two times a day (BID) | ORAL | Status: DC
  Administered 2023-12-08 – 2023-12-09 (×2): 81 mg via ORAL
  Filled 2023-12-08 (×2): qty 1

## 2023-12-08 MED ORDER — MIDAZOLAM HCL 2 MG/2ML IJ SOLN
INTRAMUSCULAR | Status: DC | PRN
  Administered 2023-12-08: 2 mg via INTRAVENOUS

## 2023-12-08 MED ORDER — SODIUM CHLORIDE 0.9 % IR SOLN
Status: DC | PRN
  Administered 2023-12-08: 1000 mL

## 2023-12-08 MED ORDER — FENTANYL CITRATE (PF) 100 MCG/2ML IJ SOLN
INTRAMUSCULAR | Status: DC | PRN
  Administered 2023-12-08: 50 ug via INTRAVENOUS

## 2023-12-08 MED ORDER — INSULIN ASPART 100 UNIT/ML IJ SOLN
INTRAMUSCULAR | Status: AC
  Administered 2023-12-08: 2 [IU] via SUBCUTANEOUS
  Filled 2023-12-08: qty 1

## 2023-12-08 MED ORDER — ONDANSETRON HCL 4 MG PO TABS: 4.0000 mg | ORAL_TABLET | Freq: Four times a day (QID) | ORAL | Status: DC | PRN

## 2023-12-08 MED ORDER — LOSARTAN POTASSIUM 25 MG PO TABS
25.0000 mg | ORAL_TABLET | Freq: Every day | ORAL | Status: DC
  Administered 2023-12-09: 25 mg via ORAL
  Filled 2023-12-08: qty 1

## 2023-12-08 MED ORDER — ONDANSETRON HCL 4 MG/2ML IJ SOLN
INTRAMUSCULAR | Status: DC | PRN
  Administered 2023-12-08: 4 mg via INTRAVENOUS

## 2023-12-08 MED ORDER — ATORVASTATIN CALCIUM 40 MG PO TABS
40.0000 mg | ORAL_TABLET | Freq: Every day | ORAL | Status: DC
Start: 2023-12-08 — End: 2023-12-08

## 2023-12-08 MED ORDER — FENTANYL CITRATE PF 50 MCG/ML IJ SOSY
PREFILLED_SYRINGE | INTRAMUSCULAR | Status: AC
  Filled 2023-12-08: qty 1

## 2023-12-08 MED ORDER — OXYCODONE HCL 5 MG PO TABS
5.0000 mg | ORAL_TABLET | ORAL | Status: DC | PRN
  Administered 2023-12-08 – 2023-12-09 (×4): 5 mg via ORAL
  Filled 2023-12-08: qty 2
  Filled 2023-12-08 (×2): qty 1
  Filled 2023-12-08: qty 2
  Filled 2023-12-08: qty 1
  Filled 2023-12-08: qty 2

## 2023-12-08 MED ORDER — METHOCARBAMOL 500 MG PO TABS
500.0000 mg | ORAL_TABLET | Freq: Four times a day (QID) | ORAL | Status: DC | PRN
  Administered 2023-12-08 – 2023-12-09 (×3): 500 mg via ORAL
  Filled 2023-12-08 (×3): qty 1

## 2023-12-08 MED ORDER — ALUM & MAG HYDROXIDE-SIMETH 200-200-20 MG/5ML PO SUSP: 30.0000 mL | ORAL | Status: DC | PRN

## 2023-12-08 MED ORDER — VANCOMYCIN HCL IN DEXTROSE 1-5 GM/200ML-% IV SOLN
1000.0000 mg | Freq: Two times a day (BID) | INTRAVENOUS | Status: AC
  Administered 2023-12-08: 1000 mg via INTRAVENOUS
  Filled 2023-12-08: qty 200

## 2023-12-08 MED ORDER — DIPHENHYDRAMINE HCL 12.5 MG/5ML PO ELIX: 12.5000 mg | ORAL_SOLUTION | ORAL | Status: DC | PRN

## 2023-12-08 MED ORDER — ALPRAZOLAM 0.5 MG PO TABS
2.0000 mg | ORAL_TABLET | Freq: Every day | ORAL | Status: DC | PRN
  Administered 2023-12-08: 2 mg via ORAL
  Filled 2023-12-08: qty 4

## 2023-12-08 MED ORDER — ORAL CARE MOUTH RINSE: 15.0000 mL | Freq: Once | OROMUCOSAL | Status: AC

## 2023-12-08 MED ORDER — MIDAZOLAM HCL 2 MG/2ML IJ SOLN
INTRAMUSCULAR | Status: AC
  Filled 2023-12-08: qty 2

## 2023-12-08 MED ORDER — VILAZODONE HCL 20 MG PO TABS
40.0000 mg | ORAL_TABLET | Freq: Every day | ORAL | Status: DC
Start: 2023-12-09 — End: 2023-12-09
  Administered 2023-12-09: 40 mg via ORAL
  Filled 2023-12-08: qty 2

## 2023-12-08 MED ORDER — GABAPENTIN 300 MG PO CAPS
600.0000 mg | ORAL_CAPSULE | Freq: Four times a day (QID) | ORAL | Status: DC
  Administered 2023-12-09 (×2): 600 mg via ORAL
  Filled 2023-12-08 (×3): qty 2

## 2023-12-08 MED ORDER — BUPROPION HCL ER (XL) 300 MG PO TB24
300.0000 mg | ORAL_TABLET | Freq: Every day | ORAL | Status: DC
  Administered 2023-12-09: 300 mg via ORAL
  Filled 2023-12-08: qty 1

## 2023-12-08 MED ORDER — 0.9 % SODIUM CHLORIDE (POUR BTL) OPTIME
TOPICAL | Status: DC | PRN
  Administered 2023-12-08: 1000 mL

## 2023-12-08 MED ORDER — FENTANYL CITRATE PF 50 MCG/ML IJ SOSY
PREFILLED_SYRINGE | INTRAMUSCULAR | Status: AC
  Administered 2023-12-08: 25 ug via INTRAVENOUS
  Filled 2023-12-08: qty 1

## 2023-12-08 MED ORDER — AMISULPRIDE (ANTIEMETIC) 5 MG/2ML IV SOLN: 10.0000 mg | Freq: Once | INTRAVENOUS | Status: DC | PRN

## 2023-12-08 MED ORDER — TRANEXAMIC ACID-NACL 1000-0.7 MG/100ML-% IV SOLN
1000.0000 mg | INTRAVENOUS | Status: AC
  Administered 2023-12-08: 1000 mg via INTRAVENOUS
  Filled 2023-12-08: qty 100

## 2023-12-08 MED ORDER — MENTHOL 3 MG MT LOZG: 1.0000 | LOZENGE | OROMUCOSAL | Status: DC | PRN

## 2023-12-08 MED ORDER — ACETAMINOPHEN 325 MG PO TABS: 325.0000 mg | ORAL_TABLET | Freq: Four times a day (QID) | ORAL | Status: DC | PRN

## 2023-12-08 MED ORDER — LACTATED RINGERS IV SOLN: INTRAVENOUS | Status: DC

## 2023-12-08 MED ORDER — INSULIN ASPART 100 UNIT/ML IJ SOLN
INTRAMUSCULAR | Status: AC
  Filled 2023-12-08: qty 1

## 2023-12-08 MED ORDER — ONDANSETRON HCL 4 MG/2ML IJ SOLN: 4.0000 mg | Freq: Four times a day (QID) | INTRAMUSCULAR | Status: DC | PRN

## 2023-12-08 MED ORDER — FENTANYL CITRATE (PF) 100 MCG/2ML IJ SOLN
INTRAMUSCULAR | Status: AC
  Filled 2023-12-08: qty 2

## 2023-12-08 MED ORDER — ATORVASTATIN CALCIUM 40 MG PO TABS
40.0000 mg | ORAL_TABLET | Freq: Every day | ORAL | Status: DC
  Administered 2023-12-09: 40 mg via ORAL
  Filled 2023-12-08: qty 1

## 2023-12-08 MED ORDER — LIDOCAINE 2% (20 MG/ML) 5 ML SYRINGE
INTRAMUSCULAR | Status: DC | PRN
  Administered 2023-12-08: 60 mg via INTRAVENOUS

## 2023-12-08 MED ORDER — OXYCODONE HCL 5 MG PO TABS
10.0000 mg | ORAL_TABLET | ORAL | Status: DC | PRN
  Administered 2023-12-09: 5 mg via ORAL
  Administered 2023-12-09 (×2): 10 mg via ORAL
  Filled 2023-12-08: qty 2

## 2023-12-08 MED ORDER — POVIDONE-IODINE 10 % EX SWAB
2.0000 | Freq: Once | CUTANEOUS | Status: DC
Start: 2023-12-08 — End: 2023-12-08

## 2023-12-08 MED ORDER — VILAZODONE HCL 40 MG PO TABS
40.0000 mg | ORAL_TABLET | Freq: Every day | ORAL | Status: DC
Start: 2023-12-09 — End: 2023-12-08

## 2023-12-08 MED ORDER — BUPIVACAINE IN DEXTROSE 0.75-8.25 % IT SOLN
INTRATHECAL | Status: DC | PRN
  Administered 2023-12-08: 2 mL via INTRATHECAL

## 2023-12-08 MED ORDER — METOPROLOL SUCCINATE ER 50 MG PO TB24
100.0000 mg | ORAL_TABLET | Freq: Every day | ORAL | Status: DC
  Administered 2023-12-09: 100 mg via ORAL
  Filled 2023-12-08: qty 2

## 2023-12-08 MED ORDER — METOCLOPRAMIDE HCL 5 MG/ML IJ SOLN: 5.0000 mg | Freq: Three times a day (TID) | INTRAMUSCULAR | Status: DC | PRN

## 2023-12-08 MED ORDER — FENTANYL CITRATE PF 50 MCG/ML IJ SOSY
25.0000 ug | PREFILLED_SYRINGE | INTRAMUSCULAR | Status: DC | PRN
  Administered 2023-12-08: 50 ug via INTRAVENOUS
  Administered 2023-12-08: 25 ug via INTRAVENOUS

## 2023-12-08 MED ORDER — SODIUM CHLORIDE 0.9 % IV SOLN
INTRAVENOUS | Status: DC
Start: 2023-12-08 — End: 2023-12-09

## 2023-12-08 MED ORDER — DEXAMETHASONE SODIUM PHOSPHATE 10 MG/ML IJ SOLN
INTRAMUSCULAR | Status: DC | PRN
  Administered 2023-12-08: 10 mg via INTRAVENOUS

## 2023-12-08 MED ORDER — PROPOFOL 500 MG/50ML IV EMUL
INTRAVENOUS | Status: DC | PRN
  Administered 2023-12-08: 75 ug/kg/min via INTRAVENOUS
  Administered 2023-12-08: 30 mg via INTRAVENOUS

## 2023-12-08 MED ORDER — HYDROMORPHONE HCL 1 MG/ML IJ SOLN: 0.5000 mg | INTRAMUSCULAR | Status: DC | PRN

## 2023-12-08 MED ORDER — METOCLOPRAMIDE HCL 5 MG PO TABS: 5.0000 mg | ORAL_TABLET | Freq: Three times a day (TID) | ORAL | Status: DC | PRN

## 2023-12-08 MED ORDER — LEVOFLOXACIN IN D5W 500 MG/100ML IV SOLN
500.0000 mg | INTRAVENOUS | Status: AC
  Administered 2023-12-08: 500 mg via INTRAVENOUS
  Filled 2023-12-08: qty 100

## 2023-12-08 MED ORDER — METHOCARBAMOL 1000 MG/10ML IJ SOLN: 500.0000 mg | Freq: Four times a day (QID) | INTRAMUSCULAR | Status: DC | PRN

## 2023-12-08 MED ORDER — CHLORHEXIDINE GLUCONATE 4 % EX SOLN
1.0000 | CUTANEOUS | 1 refills | Status: DC
  Filled 2023-12-08: qty 946, 30d supply, fill #0

## 2023-12-08 MED ORDER — CHLORHEXIDINE GLUCONATE 0.12 % MT SOLN
15.0000 mL | Freq: Once | OROMUCOSAL | Status: AC
  Administered 2023-12-08: 15 mL via OROMUCOSAL

## 2023-12-08 MED ORDER — PANTOPRAZOLE SODIUM 40 MG PO TBEC
40.0000 mg | DELAYED_RELEASE_TABLET | Freq: Every day | ORAL | Status: DC
  Administered 2023-12-08 – 2023-12-09 (×2): 40 mg via ORAL
  Filled 2023-12-08 (×2): qty 1

## 2023-12-08 SURGICAL SUPPLY — 37 items
BAG COUNTER SPONGE SURGICOUNT (BAG) ×1 IMPLANT
BAG ZIPLOCK 12X15 (MISCELLANEOUS) IMPLANT
BALL HIP ARTICU EZE 36 8.5 (Hips) IMPLANT
BENZOIN TINCTURE PRP APPL 2/3 (GAUZE/BANDAGES/DRESSINGS) IMPLANT
BLADE SAW SGTL 18X1.27X75 (BLADE) ×1 IMPLANT
COVER PERINEAL POST (MISCELLANEOUS) ×1 IMPLANT
COVER SURGICAL LIGHT HANDLE (MISCELLANEOUS) ×1 IMPLANT
CUP ACETAB 66MM HIP (Hips) IMPLANT
DRAPE FOOT SWITCH (DRAPES) ×1 IMPLANT
DRAPE STERI IOBAN 125X83 (DRAPES) ×1 IMPLANT
DRAPE U-SHAPE 47X51 STRL (DRAPES) ×2 IMPLANT
DRSG AQUACEL AG ADV 3.5X10 (GAUZE/BANDAGES/DRESSINGS) ×1 IMPLANT
DURAPREP 26ML APPLICATOR (WOUND CARE) ×1 IMPLANT
ELECT REM PT RETURN 15FT ADLT (MISCELLANEOUS) ×1 IMPLANT
GAUZE XEROFORM 1X8 LF (GAUZE/BANDAGES/DRESSINGS) IMPLANT
GLOVE BIO SURGEON STRL SZ7.5 (GLOVE) ×1 IMPLANT
GLOVE BIOGEL PI IND STRL 8 (GLOVE) ×2 IMPLANT
GLOVE ECLIPSE 8.0 STRL XLNG CF (GLOVE) ×1 IMPLANT
GOWN STRL REUS W/ TWL XL LVL3 (GOWN DISPOSABLE) ×2 IMPLANT
HEAD CERAMIC DELTA 36 PLUS 1.5 (Hips) IMPLANT
HIP BALL ARTICU EZE 36 8.5 (Hips) ×1 IMPLANT
HOLDER FOLEY CATH W/STRAP (MISCELLANEOUS) ×1 IMPLANT
KIT TURNOVER KIT A (KITS) IMPLANT
LINER NEUTRAL 66X36MM P4 (Liner) IMPLANT
PACK ANTERIOR HIP CUSTOM (KITS) ×1 IMPLANT
SET HNDPC FAN SPRY TIP SCT (DISPOSABLE) ×1 IMPLANT
STAPLER SKIN PROX WIDE 3.9 (STAPLE) IMPLANT
STEM FEMORAL SZ9 HIGH ACTIS (Stem) IMPLANT
STRIP CLOSURE SKIN 1/2X4 (GAUZE/BANDAGES/DRESSINGS) IMPLANT
SUT ETHIBOND NAB CT1 #1 30IN (SUTURE) ×1 IMPLANT
SUT ETHILON 2 0 PS N (SUTURE) IMPLANT
SUT MNCRL AB 4-0 PS2 18 (SUTURE) IMPLANT
SUT VIC AB 0 CT1 36 (SUTURE) ×1 IMPLANT
SUT VIC AB 1 CT1 36 (SUTURE) ×1 IMPLANT
SUT VIC AB 2-0 CT1 TAPERPNT 27 (SUTURE) ×2 IMPLANT
TRAY FOLEY MTR SLVR 16FR STAT (SET/KITS/TRAYS/PACK) IMPLANT
YANKAUER SUCT BULB TIP NO VENT (SUCTIONS) ×1 IMPLANT

## 2023-12-08 NOTE — Transfer of Care (Signed)
Immediate Anesthesia Transfer of Care Note  Patient: Brandon Gordon  Procedure(s) Performed: Procedure(s): LEFT TOTAL HIP ARTHROPLASTY ANTERIOR APPROACH (Left)  Patient Location: PACU  Anesthesia Type:Spinal  Level of Consciousness: awake, alert  and oriented  Airway & Oxygen Therapy: Patient Spontanous Breathing  Post-op Assessment: Report given to RN and Post -op Vital signs reviewed and stable  Post vital signs: Reviewed and stable  Last Vitals:  Vitals:   12/08/23 1040 12/08/23 1513  BP: (!) 142/92 114/83  Pulse: 70 75  Resp: 16 13  Temp: 36.6 C 36.7 C  SpO2: 98% 100%    Complications: No apparent anesthesia complications

## 2023-12-08 NOTE — Plan of Care (Signed)
  Problem: Education: Goal: Knowledge of General Education information will improve Description: Including pain rating scale, medication(s)/side effects and non-pharmacologic comfort measures Outcome: Progressing   Problem: Clinical Measurements: Goal: Will remain free from infection Outcome: Progressing Goal: Diagnostic test results will improve Outcome: Progressing Goal: Cardiovascular complication will be avoided Outcome: Progressing   Problem: Activity: Goal: Risk for activity intolerance will decrease Outcome: Progressing   Problem: Nutrition: Goal: Adequate nutrition will be maintained Outcome: Progressing   Problem: Pain Management: Goal: General experience of comfort will improve Outcome: Progressing   Problem: Safety: Goal: Ability to remain free from injury will improve Outcome: Progressing

## 2023-12-08 NOTE — Plan of Care (Signed)
  Problem: Education: Goal: Knowledge of General Education information will improve Description: Including pain rating scale, medication(s)/side effects and non-pharmacologic comfort measures Outcome: Progressing   Problem: Clinical Measurements: Goal: Will remain free from infection Outcome: Progressing Goal: Diagnostic test results will improve Outcome: Progressing Goal: Cardiovascular complication will be avoided Outcome: Progressing   Problem: Activity: Goal: Risk for activity intolerance will decrease Outcome: Progressing   Problem: Nutrition: Goal: Adequate nutrition will be maintained Outcome: Progressing   Problem: Pain Management: Goal: General experience of comfort will improve Outcome: Progressing   Problem: Safety: Goal: Ability to remain free from injury will improve Outcome: Progressing   Problem: Education: Goal: Knowledge of the prescribed therapeutic regimen will improve Outcome: Progressing Goal: Understanding of discharge needs will improve Outcome: Progressing   Problem: Activity: Goal: Ability to avoid complications of mobility impairment will improve Outcome: Progressing   Problem: Pain Management: Goal: Pain level will decrease with appropriate interventions Outcome: Progressing

## 2023-12-08 NOTE — Anesthesia Preprocedure Evaluation (Addendum)
Anesthesia Evaluation  Patient identified by MRN, date of birth, ID band Patient awake    Reviewed: Allergy & Precautions, NPO status , Patient's Chart, lab work & pertinent test results, reviewed documented beta blocker date and time   History of Anesthesia Complications Negative for: history of anesthetic complications  Airway Mallampati: III  TM Distance: >3 FB Neck ROM: Full    Dental  (+) Teeth Intact, Dental Advisory Given   Pulmonary neg pulmonary ROS   Pulmonary exam normal breath sounds clear to auscultation       Cardiovascular hypertension, Pt. on home beta blockers and Pt. on medications Normal cardiovascular exam Rhythm:Regular Rate:Normal     Neuro/Psych  PSYCHIATRIC DISORDERS Anxiety     negative neurological ROS     GI/Hepatic negative GI ROS, Neg liver ROS,,,  Endo/Other  diabetes, Type 2, Oral Hypoglycemic Agents    Renal/GU Renal InsufficiencyRenal disease     Musculoskeletal  (+) Arthritis , Osteoarthritis,  osteoarthritis left hip   Abdominal   Peds  Hematology  (+) Blood dyscrasia, anemia Plt 329k   Anesthesia Other Findings Day of surgery medications reviewed with the patient.  Reproductive/Obstetrics                             Anesthesia Physical Anesthesia Plan  ASA: 2  Anesthesia Plan: Spinal   Post-op Pain Management: Tylenol PO (pre-op)*   Induction:   PONV Risk Score and Plan: 1 and Midazolam, TIVA, Dexamethasone and Ondansetron  Airway Management Planned: Simple Face Mask and Natural Airway  Additional Equipment:   Intra-op Plan:   Post-operative Plan:   Informed Consent: I have reviewed the patients History and Physical, chart, labs and discussed the procedure including the risks, benefits and alternatives for the proposed anesthesia with the patient or authorized representative who has indicated his/her understanding and acceptance.      Dental advisory given  Plan Discussed with: CRNA, Anesthesiologist and Surgeon  Anesthesia Plan Comments: (Discussed risks and benefits of and differences between spinal and general. Discussed risks of spinal including headache, backache, failure, bleeding, infection, and nerve damage. Patient consents to spinal. Questions answered. Coagulation studies and platelet count acceptable.)        Anesthesia Quick Evaluation

## 2023-12-08 NOTE — Evaluation (Signed)
Physical Therapy Evaluation Patient Details Name: Brandon Gordon MRN: [id number] DOB: [birth year] Today's Date: 12/08/2023  History of Present Illness  52 yo male presents to therapy s/p L THA, anterior approach on 12/08/2023 due to failure of conservative measures. Pt PMH includes but is not limited to: chronic pain, LBP, anxiety, hx of kidney stones, HTN, HLD, OA, DM II, R THA (2012) and L 5th ray bone excision.  Clinical Impression   Brandon Gordon is a 52 y.o. male POD 0 s/p L THA. Patient reports mod I with mobility at baseline. Patient is now limited by functional impairments (see PT problem list below) and requires CGA for bed mobility and CGA and cues for transfers. Patient was able to ambulate 5 feet with RW and CGA level of assist.Patient will benefit from continued skilled PT interventions to address impairments and progress towards PLOF. Acute PT will follow to progress mobility and stair training in preparation for safe discharge home with family support and [facility] services.       If plan is discharge home, recommend the following: A little help with walking and/or transfers;A little help with bathing/dressing/bathroom;Assistance with cooking/housework;Help with stairs or ramp for entrance;Assist for transportation   Can travel by private vehicle        Equipment Recommendations Rolling walker (2 wheels)  Recommendations for Other Services       Functional Status Assessment Patient has had a recent decline in their functional status and demonstrates the ability to make significant improvements in function in a reasonable and predictable amount of time.     Precautions / Restrictions Precautions Precautions: Fall Restrictions Weight Bearing Restrictions Per Provider Order: No      Mobility  Bed Mobility Overal bed mobility: Needs Assistance Bed Mobility: Supine to Sit     Supine to sit: Contact guard, HOB elevated, Used rails     General bed mobility comments: min  cues    Transfers Overall transfer level: Needs assistance Equipment used: Rolling walker (2 wheels) Transfers: Sit to/from Stand Sit to Stand: Contact guard assist, From elevated surface           General transfer comment: min cues and pt able to push to stand    Ambulation/Gait Ambulation/Gait assistance: Contact guard assist Gait Distance (Feet): 5 Feet Assistive device: Rolling walker (2 wheels) Gait Pattern/deviations: Step-to pattern, Antalgic, Trunk flexed Gait velocity: decreased     General Gait Details: min cues for safety and sequencing, pt indicated pain did not increase with gait tasks however B UE fatigue limiting tolerance.  Stairs            Wheelchair Mobility     Tilt Bed    Modified Rankin (Stroke Patients Only)       Balance Overall balance assessment: Needs assistance Sitting-balance support: Feet supported Sitting balance-Leahy Scale: Good     Standing balance support: Bilateral upper extremity supported, During functional activity, Reliant on assistive device for balance Standing balance-Leahy Scale: Fair Standing balance comment: static standing no UE support                             Pertinent Vitals/Pain Pain Assessment Pain Assessment: 0-10 Pain Score: 5  Pain Location: L hip and low back Pain Descriptors / Indicators: Aching, Burning, Constant, Discomfort, Dull, Grimacing, Operative site guarding Pain Intervention(s): Limited activity within patient's tolerance, Monitored during session, Premedicated before session, Repositioned, Patient requesting pain meds-RN notified, Ice applied  Home Living Family/patient expects to be discharged to:: Private residence Living Arrangements: Spouse/significant other;Children Available Help at Discharge: Family Type of Home: House Home Access: Stairs to enter Entrance Stairs-Rails: None Entrance Stairs-Number of Steps: 2 Alternate Level Stairs-Number of Steps: 16 with  landing Home Layout: Two level;Bed/bath upstairs Home Equipment: Cane - single point      Prior Function Prior Level of Function : Independent/Modified Independent;Driving             Mobility Comments: mod I with occational use of SPC with all ADLs, self care tasks and IADLs.       Extremity/Trunk Assessment        Lower Extremity Assessment Lower Extremity Assessment: LLE deficits/detail LLE Deficits / Details: ankle DF/PF 5/5 LLE Sensation: decreased light touch;decreased proprioception (buttock, groin and proximal hamstrings)    Cervical / Trunk Assessment Cervical / Trunk Assessment: Other exceptions (trunk flexion with hip flexion contracture PLOF)  Communication   Communication Communication: No apparent difficulties  Cognition Arousal: Alert Behavior During Therapy: WFL for tasks assessed/performed Overall Cognitive Status: Within Functional Limits for tasks assessed                                          General Comments      Exercises     Assessment/Plan    PT Assessment Patient needs continued PT services  PT Problem List Decreased strength;Decreased range of motion;Decreased activity tolerance;Decreased balance;Decreased mobility;Decreased coordination;Pain       PT Treatment Interventions DME instruction;Gait training;Stair training;Functional mobility training;Therapeutic activities;Therapeutic exercise;Balance training;Neuromuscular re-education;Patient/family education;Modalities    PT Goals (Current goals can be found in the Care Plan section)  Acute Rehab PT Goals Patient Stated Goal: to be able to walk more normally without pain, return to golf and tennis PT Goal Formulation: With patient Time For Goal Achievement: 12/22/23 Potential to Achieve Goals: Good    Frequency 7X/week     Co-evaluation               AM-PAC PT "6 Clicks" Mobility  Outcome Measure Help needed turning from your back to your side  while in a flat bed without using bedrails?: A Little Help needed moving from lying on your back to sitting on the side of a flat bed without using bedrails?: A Little Help needed moving to and from a bed to a chair (including a wheelchair)?: A Little Help needed standing up from a chair using your arms (e.g., wheelchair or bedside chair)?: A Little Help needed to walk in hospital room?: A Little Help needed climbing 3-5 steps with a railing? : Total 6 Click Score: 16    End of Session Equipment Utilized During Treatment: Gait belt Activity Tolerance: Patient limited by fatigue Patient left: in chair;with call bell/phone within reach;with chair alarm set;with nursing/sitter in room;with family/visitor present Nurse Communication: Mobility status PT Visit Diagnosis: Unsteadiness on feet (R26.81);Other abnormalities of gait and mobility (R26.89);Muscle weakness (generalized) (M62.81);Difficulty in walking, not elsewhere classified (R26.2);Pain Pain - Right/Left: Left Pain - part of body: Hip;Leg    Time: 1914-7829 PT Time Calculation (min) (ACUTE ONLY): 31 min   Charges:   PT Evaluation $PT Eval Low Complexity: 1 Low PT Treatments $Therapeutic Activity: 8-22 mins PT General Charges $$ ACUTE PT VISIT: 1 Visit         Johnny Bridge, PT Acute Rehab   Jacqualyn Posey 12/08/2023, 6:58 PM

## 2023-12-08 NOTE — Interval H&P Note (Signed)
History and Physical Interval Note: The patient understands that he is here today for a left total hip replacement to treat his severe left hip arthritis.  There has been no acute or interval change in his medical status.  The risks and benefits of surgery have been discussed in detail and informed consent has been obtained.  The left operative hip has been marked.  12/08/2023 11:23 AM  Brandon Gordon  has presented today for surgery, with the diagnosis of osteoarthritis left hip.  The various methods of treatment have been discussed with the patient and family. After consideration of risks, benefits and other options for treatment, the patient has consented to  Procedure(s): LEFT TOTAL HIP ARTHROPLASTY ANTERIOR APPROACH (Left) as a surgical intervention.  The patient's history has been reviewed, patient examined, no change in status, stable for surgery.  I have reviewed the patient's chart and labs.  Questions were answered to the patient's satisfaction.     Kathryne Hitch

## 2023-12-08 NOTE — Op Note (Signed)
Operative Note  Date of operation: 12/08/2023 Preoperative diagnosis: Left hip severe primary osteoarthritis Postoperative diagnosis: Same  Procedure: Left direct anterior total hip arthroplasty  Implants: Implant Name Type Inv. Item Serial No. Manufacturer Lot No. LRB No. Used Action  CUP ACETAB HIP - [id number] Hips CUP ACETAB HIP  DEPUY [employer]TH[employer] [id number] Left 1 Implanted  LINER NEUTRAL 66X36MM P4 - [id number] Liner LINER NEUTRAL 66X36MM P4  DEPUY ORTH[employer] [id number] Left 1 Implanted  STEM FEMORAL SZ9 HIGH ACTIS - [id number] Stem STEM FEMORAL SZ9 HIGH ACTIS  DEPUY ORTHOPAEDICS [id number] Left 1 Implanted  HIP BALL ARTICU EZE 36 8.5 - [id number] Hips HIP BALL ARTICU EZE 36 8.5  DEPUY ORTHOPAEDICS [id number] Left 1 Implanted   Surgeon: Vanita Panda. Magnus Ivan, MD Assistant: Rexene Edison, PA-C  Anesthesia: Spinal Antibiotics: IV vancomycin and IV Levaquin EBL: 500 cc Complications: None  Indications: Lorin Picket is a 52 year old gentleman well-known to me.  We actually replaced his right hip in 2012 through a direct tender approach secondary to severe arthritis of the right hip.  That was 12 years ago.  He has developed debilitating hip pain with his left hip.  He also has a severe scoliosis and is in chronic pain management.  He now has a flexion contracture of his left hip given the severity of his pain in his body trying to decrease his pain with taking pressure off the hip.  He understands that this is going to make for a significantly difficult case.  He is someone who is 6 foot 6 inches tall as well.  He used to be morbidly obese but has lost a significant mount of weight.  His x-rays show severe and stage arthritis of his left hip.  At this point his pain is severe and it is daily with his left hip.  It is debilitating and detrimentally affecting his mobility, his quality of life and his actives daily living.  He understands with hip replacement surgery the risk of acute loss anemia,  nerve and vessel injury, hip instability with dislocation, DVT, implant failure, leg length differences and wound healing issues.  I think all of these are elevating his situation given his significant flexion contracture.  He understands that our goals are hopefully decreased pain, improved mobility, and improved quality of life.  Procedure description: After informed consent was obtained appropriate left hip was marked, the patient was brought to the operating room and set up on the stretcher where spinal anesthesia was obtained.  It was a difficult spinal given the severity of the scoliosis.  Next he was laid in supine position and a Foley catheter was placed.  Traction boots were placed on both his feet.  It is a difficult to assess his leg length because his hip is in a flexed position and we cannot straighten his knee.  We did place him supine on the Hana table with a perineal post in place in both lines and inline skeletal traction vices no traction applied.  We assessed his left hip and pelvis radiographically.  The left hip was then prepped and draped with DuraPrep and sterile drapes.  Timeout is called who identified scribe patient correct left hip.  We then made incision just inferior and posterior to the ASIS and carried this slightly obliquely down the leg.  Dissection was carried down to the tensor fascia lata muscle and the tensor fascia was then divided longitudinally to proceed with a direct interposed the hip.  Circumflex vessels were identified and cauterized.  The hip capsule was identified and opened up in L-type format finding a large joint effusion.  There was significant deformity of his hip.  We made a femoral neck cut with an oscillating saw just proximal to the lesser trochanter and completed this with an osteotome.  We were able to move the femoral head its entirety it was incredibly large and had wide areas of devoid of cartilage.  We then placed a bent Hohmann over the medial  acetabular rim and remnants of the acetabular labrum and other debris were removed.  We then began reaming under direct visitation from a size 50 reamer going all the way up to a size 65 reamer with all reamers placed under direct visualization and the last reamer also placed under direct fluoroscopy in order to obtain the depth reaming, the inclination and the anteversion.  We then placed the real DePuy sector GRIPTION acetabular component size 66 followed by 36+4 polythene liner.  Attention was then turned to the femur.  With the left leg externally rotated to 120 degrees, extended and adducted we were able to gain access to the femoral canal.  A Mueller retractor was placed medially and a Hohmann tract behind the greater trochanter.  The lateral joint capsule was released and a box cutting osteotome was used in her femoral canal.  We then began broaching using the Actis broaching system from a size 0 going all the way to a size 9.  With a size 9 in place we trialed a high offset femoral neck and we started with a 36-2 trial hip ball given his flexion contracture.  The left leg was brought over and up and with traction and internal rotation reduced in the pelvis.  We felt like we needed more leg length.  We dislocated the hip and removed the trial components.  We placed the real Actis femoral component with high offset size 9 mm with a 36+1.5 ceramic head ball.  This reduced in the acetabulum but I was not pleased with the stability once we had released type tissue.  We dislocated the hip and removed that 36+1.5 ceramic hip ball and we went all the way up to a 36+8.5 metal head ball.  This was reduced in the pelvis and surprisingly this gave him near equal leg lengths.  It was definitely's tight and stable on exam throughout the arc of motion.  We assessed it radiographically and clinically.  We then irrigated the soft tissue with normal saline solution.  Remnants of the joint capsule were closed with interrupted  #1 Ethibond suture followed by #1 Vicryl close the tensor fascia.  0 Vicryl was used to close deep tissue and 2-0 Vicryl was used to close subcutaneous tissue.  The skin was closed with staples.  An Aquacel dressing was applied.  The patient was taken off the Hana table and taken to the recovery room.  Rexene Edison, PA-C did assist during the entire case and beginning to end and his assistance was crucial and medically necessary for soft tissue management and retraction, helping guide implant placement and a layered closure of the wound.

## 2023-12-09 ENCOUNTER — Other Ambulatory Visit (HOSPITAL_COMMUNITY): Payer: Self-pay

## 2023-12-09 DIAGNOSIS — E119 Type 2 diabetes mellitus without complications: Secondary | ICD-10-CM | POA: Diagnosis not present

## 2023-12-09 DIAGNOSIS — Z7984 Long term (current) use of oral hypoglycemic drugs: Secondary | ICD-10-CM | POA: Diagnosis not present

## 2023-12-09 DIAGNOSIS — Z96641 Presence of right artificial hip joint: Secondary | ICD-10-CM | POA: Diagnosis not present

## 2023-12-09 DIAGNOSIS — Z79899 Other long term (current) drug therapy: Secondary | ICD-10-CM | POA: Diagnosis not present

## 2023-12-09 DIAGNOSIS — I10 Essential (primary) hypertension: Secondary | ICD-10-CM | POA: Diagnosis not present

## 2023-12-09 DIAGNOSIS — M1612 Unilateral primary osteoarthritis, left hip: Secondary | ICD-10-CM | POA: Diagnosis not present

## 2023-12-09 LAB — CBC
HCT: 30.1 % — ABNORMAL LOW (ref 39.0–52.0)
Hemoglobin: 9.8 g/dL — ABNORMAL LOW (ref 13.0–17.0)
MCH: 28.1 pg (ref 26.0–34.0)
MCHC: 32.6 g/dL (ref 30.0–36.0)
MCV: 86.2 fL (ref 80.0–100.0)
Platelets: 212 10*3/uL (ref 150–400)
RBC: 3.49 MIL/uL — ABNORMAL LOW (ref 4.22–5.81)
RDW: 12.5 % (ref 11.5–15.5)
WBC: 9.8 10*3/uL (ref 4.0–10.5)
nRBC: 0 % (ref 0.0–0.2)

## 2023-12-09 LAB — BASIC METABOLIC PANEL
Anion gap: 7 (ref 5–15)
BUN: 27 mg/dL — ABNORMAL HIGH (ref 6–20)
CO2: 24 mmol/L (ref 22–32)
Calcium: 8.8 mg/dL — ABNORMAL LOW (ref 8.9–10.3)
Chloride: 105 mmol/L (ref 98–111)
Creatinine, Ser: 1.23 mg/dL (ref 0.61–1.24)
GFR, Estimated: >60 mL/min (ref >60)
Glucose, Bld: 260 mg/dL — ABNORMAL HIGH (ref 70–99)
Potassium: 5.3 mmol/L — ABNORMAL HIGH (ref 3.5–5.1)
Sodium: 136 mmol/L (ref 135–145)

## 2023-12-09 MED ORDER — OXYCODONE HCL 5 MG PO TABS
5.0000 mg | ORAL_TABLET | ORAL | 0 refills | Status: DC | PRN
  Filled 2023-12-09: qty 30, 3d supply, fill #0

## 2023-12-09 MED ORDER — TIZANIDINE HCL 4 MG PO TABS
4.0000 mg | ORAL_TABLET | Freq: Four times a day (QID) | ORAL | 1 refills | Status: AC | PRN
  Filled 2023-12-09: qty 60, 15d supply, fill #0
  Filled 2024-04-04: qty 60, 15d supply, fill #1

## 2023-12-09 NOTE — Progress Notes (Signed)
Physical Therapy Treatment Patient Details Name: Brandon Gordon MRN: [id number] DOB: 29-Oct-1971 Today's Date: 12/09/2023   History of Present Illness 52 yo male presents to therapy s/p L THA, anterior approach on 12/08/2023 due to failure of conservative measures. Pt PMH includes but is not limited to: chronic pain, LBP, anxiety, hx of kidney stones, HTN, HLD, OA, DM II, R THA (2012) and L 5th ray bone excision.    PT Comments  POD # 1 am session Pt AxO x 3 pleasant and motivated.  Supportive Spouse at bed side who is also a LPT.  Assisted OOB.  General bed mobility comments: demonstrated and instructed how to use a belt to self assist LE.  General transfer comment: .  Mild c/o dizziness while seated EOB.  BP 144/104(117)  General Gait Details: 25% VC's to avoid excessive lean on walker as well as upright paoture (Hx scoliosis) with recliner following due to mild c/o dizziness.  BP standing 121/90(100)  HR 89.  Pt tolerated amb 15 feet x 2 with one seated rest break. Returned to room in recliner.   Pt will need another PT session to Educate on HEP and increase amb distance/tolerance.     If plan is discharge home, recommend the following: A little help with walking and/or transfers;A little help with bathing/dressing/bathroom;Assistance with cooking/housework;Help with stairs or ramp for entrance;Assist for transportation   Can travel by private vehicle        Equipment Recommendations  Rolling walker (2 wheels)    Recommendations for Other Services       Precautions / Restrictions Precautions Precautions: Fall Restrictions Weight Bearing Restrictions Per Provider Order: No     Mobility  Bed Mobility Overal bed mobility: Needs Assistance Bed Mobility: Supine to Sit     Supine to sit: Contact guard, HOB elevated, Used rails     General bed mobility comments: demonstrated and instructed how to use a belt to self assist LE    Transfers Overall transfer level: Needs  assistance Equipment used: Rolling walker (2 wheels) Transfers: Sit to/from Stand Sit to Stand: Contact guard assist, From elevated surface           General transfer comment: .  Mild c/o dizziness while seated EOB.  BP [id number])    Ambulation/Gait Ambulation/Gait assistance: Contact guard assist Gait Distance (Feet): 30 Feet (15 feet x 2) Assistive device: Rolling walker (2 wheels) Gait Pattern/deviations: Step-to pattern, Antalgic, Trunk flexed Gait velocity: decreased     General Gait Details: 25% VC's to avoid excessive lean on walker as well as upright paoture (Hx scoliosis) with recliner following due to mild c/o dizziness.  BP standing 121/90(100)  HR 89.  Pt tolerated amb 15 feet x 2 with one seated rest break.   Stairs             Wheelchair Mobility     Tilt Bed    Modified Rankin (Stroke Patients Only)       Balance                                            Cognition Arousal: Alert Behavior During Therapy: WFL for tasks assessed/performed Overall Cognitive Status: Within Functional Limits for tasks assessed  General Comments: AxO x 3 pleasant and motivated.        Exercises      General Comments        Pertinent Vitals/Pain Pain Assessment Pain Assessment: 0-10 Pain Score: 7  Pain Location: L hip and low back Pain Descriptors / Indicators: Aching, Burning, Constant, Discomfort, Dull, Grimacing, Operative site guarding Pain Intervention(s): Monitored during session, Premedicated before session, Repositioned, Ice applied    Home Living                          Prior Function            PT Goals (current goals can now be found in the care plan section) Progress towards PT goals: Progressing toward goals    Frequency    7X/week      PT Plan      Co-evaluation              AM-PAC PT "6 Clicks" Mobility   Outcome Measure  Help needed  turning from your back to your side while in a flat bed without using bedrails?: A Little Help needed moving from lying on your back to sitting on the side of a flat bed without using bedrails?: A Little Help needed moving to and from a bed to a chair (including a wheelchair)?: A Little Help needed standing up from a chair using your arms (e.g., wheelchair or bedside chair)?: A Little Help needed to walk in hospital room?: A Little Help needed climbing 3-5 steps with a railing? : A Little 6 Click Score: 18    End of Session Equipment Utilized During Treatment: Gait belt Activity Tolerance: Patient limited by fatigue Patient left: in chair;with call bell/phone within reach;with chair alarm set;with nursing/sitter in room;with family/visitor present Nurse Communication: Mobility status PT Visit Diagnosis: Unsteadiness on feet (R26.81);Other abnormalities of gait and mobility (R26.89);Muscle weakness (generalized) (M62.81);Difficulty in walking, not elsewhere classified (R26.2);Pain Pain - Right/Left: Left Pain - part of body: Hip;Leg     Time: 0902-0930 PT Time Calculation (min) (ACUTE ONLY): 28 min  Charges:    $Gait Training: 8-22 mins $Therapeutic Activity: 8-22 mins PT General Charges $$ ACUTE PT VISIT: 1 Visit                     {Landan Fedie  PTA Acute  Rehabilitation Services Office M-F          [phone]

## 2023-12-09 NOTE — Progress Notes (Signed)
Physical Therapy Treatment Patient Details Name: Brandon Gordon MRN: [id number] DOB: 1971-11-16 Today's Date: 12/09/2023   History of Present Illness 52 yo male presents to therapy s/p L THA, anterior approach on 12/08/2023 due to failure of conservative measures. Pt PMH includes but is not limited to: chronic pain, LBP, anxiety, hx of kidney stones, HTN, HLD, OA, DM II, R THA (2012) and L 5th ray bone excision.    PT Comments  POD # 1 pm session Assisted OOB to amb in hallway and practice stairs went well.  General transfer comment: Had Spouse "hands on" assist with use of safety belt.  Pt feeling "a little better" no c/o dizziness this afternoon. General stair comments: first up 2 steps forward with walker as garage has NO rails with Spouse "hands on" assisted.  Second up 2 steps both hands on ONE RIGHT rail simulating stairs within home. Pt prefers to descend in the home stairs backward.  Then returned to room to perform some TE's following HEP handout.  Instructed on proper tech, freq as well as use of ICE.   Addressed all mobility questions, discussed appropriate activity, educated on use of ICE.  Pt ready for D/C to home.    If plan is discharge home, recommend the following: A little help with walking and/or transfers;A little help with bathing/dressing/bathroom;Assistance with cooking/housework;Help with stairs or ramp for entrance;Assist for transportation   Can travel by private vehicle        Equipment Recommendations  Rolling walker (2 wheels)    Recommendations for Other Services       Precautions / Restrictions Precautions Precautions: Fall Restrictions Weight Bearing Restrictions Per Provider Order: No     Mobility  Bed Mobility Overal bed mobility: Needs Assistance Bed Mobility: Supine to Sit, Sit to Supine     Supine to sit: Supervision, Contact guard Sit to supine: Supervision, Contact guard assist   General bed mobility comments: demonstrated and instructed  how to use a belt to self assist LE    Transfers Overall transfer level: Needs assistance Equipment used: Rolling walker (2 wheels) Transfers: Sit to/from Stand Sit to Stand: Supervision, Contact guard assist           General transfer comment: Had Spouse "hands on" assist with use of safety belt.  Pt feeling "a little better" no c/o dizziness this afternoon.    Ambulation/Gait Ambulation/Gait assistance: Supervision, Contact guard assist Gait Distance (Feet): 62 Feet Assistive device: Rolling walker (2 wheels) Gait Pattern/deviations: Step-to pattern, Antalgic, Trunk flexed Gait velocity: decreased     General Gait Details: tolerated an increased distance with no c/o dizziness this session just Max c/o fatigue and thigh "cramping" pain.   Stairs Stairs: Yes Stairs assistance: Supervision, Contact guard assist   Number of Stairs: 4 General stair comments: first up 2 steps forward with walker as garage has NO rails with Spouse "hands on" assisted.  Second up 2 steps both hands on ONE RIGHT rail simulating stairs within home.   Wheelchair Mobility     Tilt Bed    Modified Rankin (Stroke Patients Only)       Balance                                            Cognition Arousal: Alert Behavior During Therapy: WFL for tasks assessed/performed Overall Cognitive Status: Within Functional Limits for tasks assessed  General Comments: AxO x 3 pleasant and motivated.        Exercises  Total Hip Replacement TE's following HEP Handout 10 reps ankle pumps 05 reps knee presses 05 reps heel slides 05 reps SAQ's 05 reps ABD Instructed how to use a belt loop to assist  Followed by ICE     General Comments        Pertinent Vitals/Pain Pain Assessment Pain Assessment: 0-10 Pain Score: 7  Pain Location: L hip and low back Pain Descriptors / Indicators: Aching, Burning, Constant, Discomfort, Dull,  Grimacing, Operative site guarding Pain Intervention(s): Monitored during session, Premedicated before session, Repositioned, Ice applied    Home Living                          Prior Function            PT Goals (current goals can now be found in the care plan section) Progress towards PT goals: Progressing toward goals    Frequency    7X/week      PT Plan      Co-evaluation              AM-PAC PT "6 Clicks" Mobility   Outcome Measure  Help needed turning from your back to your side while in a flat bed without using bedrails?: A Little Help needed moving from lying on your back to sitting on the side of a flat bed without using bedrails?: A Little Help needed moving to and from a bed to a chair (including a wheelchair)?: A Little Help needed standing up from a chair using your arms (e.g., wheelchair or bedside chair)?: A Little Help needed to walk in hospital room?: A Little Help needed climbing 3-5 steps with a railing? : A Little 6 Click Score: 18    End of Session Equipment Utilized During Treatment: Gait belt Activity Tolerance: Patient limited by fatigue Patient left: in bed;with bed alarm set;with family/visitor present Nurse Communication: Mobility status PT Visit Diagnosis: Unsteadiness on feet (R26.81);Other abnormalities of gait and mobility (R26.89);Muscle weakness (generalized) (M62.81);Difficulty in walking, not elsewhere classified (R26.2);Pain Pain - Right/Left: Left Pain - part of body: Hip;Leg     Time: 4098-1191 PT Time Calculation (min) (ACUTE ONLY): 28 min  Charges:    $Gait Training: 8-22 mins $Therapeutic Activity: 8-22 mins PT General Charges $$ ACUTE PT VISIT: 1 Visit                    Felecia Shelling  PTA Acute  Rehabilitation Services Office M-F          [phone]

## 2023-12-09 NOTE — Plan of Care (Signed)
  Problem: Education: Goal: Knowledge of General Education information will improve Description Including pain rating scale, medication(s)/side effects and non-pharmacologic comfort measures Outcome: Progressing   

## 2023-12-09 NOTE — Discharge Instructions (Signed)

## 2023-12-09 NOTE — Plan of Care (Signed)
  Problem: Education: Goal: Knowledge of General Education information will improve Description: Including pain rating scale, medication(s)/side effects and non-pharmacologic comfort measures Outcome: Adequate for Discharge   Problem: Health Behavior/Discharge Planning: Goal: Ability to manage health-related needs will improve Outcome: Adequate for Discharge   Problem: Clinical Measurements: Goal: Ability to maintain clinical measurements within normal limits will improve Outcome: Adequate for Discharge Goal: Will remain free from infection Outcome: Adequate for Discharge Goal: Diagnostic test results will improve Outcome: Adequate for Discharge Goal: Respiratory complications will improve Outcome: Adequate for Discharge Goal: Cardiovascular complication will be avoided Outcome: Adequate for Discharge   Problem: Activity: Goal: Risk for activity intolerance will decrease 12/09/2023 1620 by Alice Rieger A, LPN Outcome: Adequate for Discharge 12/09/2023 0840 by Delila Spence, LPN Outcome: Progressing   Problem: Nutrition: Goal: Adequate nutrition will be maintained Outcome: Adequate for Discharge   Problem: Coping: Goal: Level of anxiety will decrease 12/09/2023 1620 by Alice Rieger A, LPN Outcome: Adequate for Discharge 12/09/2023 0840 by Delila Spence, LPN Outcome: Progressing   Problem: Elimination: Goal: Will not experience complications related to bowel motility Outcome: Adequate for Discharge Goal: Will not experience complications related to urinary retention Outcome: Adequate for Discharge   Problem: Pain Management: Goal: General experience of comfort will improve 12/09/2023 1620 by Delila Spence, LPN Outcome: Adequate for Discharge 12/09/2023 0840 by Delila Spence, LPN Outcome: Progressing   Problem: Safety: Goal: Ability to remain free from injury will improve Outcome: Adequate for Discharge   Problem: Education: Goal: Knowledge of the  prescribed therapeutic regimen will improve Outcome: Adequate for Discharge Goal: Understanding of discharge needs will improve Outcome: Adequate for Discharge Goal: Individualized Educational Video(s) Outcome: Adequate for Discharge   Problem: Activity: Goal: Ability to avoid complications of mobility impairment will improve Outcome: Adequate for Discharge Goal: Ability to tolerate increased activity will improve Outcome: Adequate for Discharge   Problem: Clinical Measurements: Goal: Postoperative complications will be avoided or minimized Outcome: Adequate for Discharge   Problem: Pain Management: Goal: Pain level will decrease with appropriate interventions Outcome: Adequate for Discharge   Problem: Skin Integrity: Goal: Will show signs of wound healing Outcome: Adequate for Discharge

## 2023-12-09 NOTE — Discharge Summary (Signed)
Patient ID: Brandon Gordon MRN: [id number] DOB/AGE: 1971-11-04 52 y.o.  Admit date: 12/08/2023 Discharge date: 12/09/2023  Admission Diagnoses:  Principal Problem:   Status post total replacement of left hip Active Problems:   Unilateral primary osteoarthritis, left hip   Discharge Diagnoses:  Same  Past Medical History:  Diagnosis Date   Anxiety    At risk for sleep apnea    STOP-BANG= 5      SENT TO PCP 04-05-2016   Chronic pain syndrome    History of kidney stones    Hyperlipidemia    Hypertension    Left ureteral stone    OA (osteoarthritis)    back, hip   Pain management    Type 2 diabetes mellitus (HCC)     Surgeries: Procedure(s): LEFT TOTAL HIP ARTHROPLASTY ANTERIOR APPROACH on 12/08/2023   Consultants:   Discharged Condition: Improved  Hospital Course: Brandon Gordon is an 52 y.o. male who was admitted 12/08/2023 for operative treatment ofStatus post total replacement of left hip. Patient has severe unremitting pain that affects sleep, daily activities, and work/hobbies. After pre-op clearance the patient was taken to the operating room on 12/08/2023 and underwent  Procedure(s): LEFT TOTAL HIP ARTHROPLASTY ANTERIOR APPROACH.    Patient was given perioperative antibiotics:  Anti-infectives (From admission, onward)    Start     Dose/Rate Route Frequency Ordered Stop   12/08/23 2330  vancomycin (VANCOCIN) IVPB 1000 mg/200 mL premix        1,000 mg 200 mL/hr over 60 Minutes Intravenous Every 12 hours 12/08/23 1736 12/08/23 2355   12/08/23 1045  levofloxacin (LEVAQUIN) IVPB 500 mg        500 mg 100 mL/hr over 60 Minutes Intravenous On call to O.R. 12/08/23 1032 12/08/23 1305   12/08/23 1045  Vancomycin (VANCOCIN) 1,500 mg in sodium chloride 0.9 % 500 mL IVPB        1,500 mg 250 mL/hr over 120 Minutes Intravenous On call to O.R. 12/08/23 1032 12/08/23 1330        Patient was given sequential compression devices, early ambulation, and chemoprophylaxis to  prevent DVT.  Patient benefited maximally from hospital stay and there were no complications.    Recent vital signs: Patient Vitals for the past 24 hrs:  BP Temp Temp src Pulse Resp SpO2  12/09/23 1318 (!) 157/92 98.3 F (36.8 C) -- 74 17 100 %  12/09/23 1001 (!) 145/83 98.9 F (37.2 C) Oral 85 15 100 %  12/09/23 0531 (!) 156/97 99 F (37.2 C) Oral 85 17 100 %  12/09/23 0130 (!) 150/100 97.7 F (36.5 C) -- 91 16 97 %  12/08/23 2004 (!) 155/94 98.2 F (36.8 C) Oral 82 17 100 %  12/08/23 1737 (!) 146/95 -- -- 73 20 98 %  12/08/23 1715 (!) 137/91 97.6 F (36.4 C) -- 72 13 98 %  12/08/23 1700 136/81 -- -- 95 11 94 %  12/08/23 1645 121/83 -- -- 71 13 97 %  12/08/23 1630 (!) 122/90 -- -- 73 13 98 %  12/08/23 1615 (!) 117/95 -- -- 78 14 95 %  12/08/23 1600 (!) 121/94 -- -- 74 12 96 %  12/08/23 1545 -- -- -- 79 (!) 21 96 %  12/08/23 1530 134/81 -- -- (!) 130 19 90 %  12/08/23 1525 115/85 -- -- (!) 102 19 (!) 89 %  12/08/23 1520 118/85 -- -- 82 13 99 %  12/08/23 1515 128/83 -- -- 76 13 100 %  12/08/23 1513 114/83 98.1 F (36.7 C) -- 75 13 100 %     Recent laboratory studies:  Recent Labs    12/08/23 1612 12/09/23 0343  WBC  --  9.8  HGB  --  9.8*  HCT  --  30.1*  PLT  --  212  NA 139 136  K 5.2* 5.3*  CL 106 105  CO2 25 24  BUN 28* 27*  CREATININE 1.43* 1.23  GLUCOSE 197* 260*  CALCIUM 8.8* 8.8*     Discharge Medications:   Allergies as of 12/09/2023       Reactions   Cephalexin    Other reaction(s): stomach pain   Lexapro [escitalopram Oxalate] Other (See Comments)   Sexual disfunction   Victoza [liraglutide] Nausea Only        Medication List     STOP taking these medications    Buprenorphine HCl-Naloxone HCl 8-2 MG Film Commonly known as: Suboxone       TAKE these medications    acetaminophen 325 MG tablet Commonly known as: TYLENOL Take 650 mg by mouth every 6 (six) hours as needed for moderate pain or mild pain.   ALPRAZolam 1 MG  tablet Commonly known as: XANAX Take 2 tablets (2 mg total) by mouth daily as needed.   atorvastatin 40 MG tablet Commonly known as: LIPITOR Take 1 tablet (40 mg total) by mouth daily.   atorvastatin 40 MG tablet Commonly known as: LIPITOR Take 1 tablet (40 mg total) by mouth daily.   buPROPion 300 MG 24 hr tablet Commonly known as: WELLBUTRIN XL TAKE 1 TABLET BY MOUTH ONCE DAILY   buPROPion 300 MG 24 hr tablet Commonly known as: WELLBUTRIN XL Take 1 tablet (300 mg total) by mouth in the morning.   chlorhexidine 4 % external liquid Commonly known as: HIBICLENS Apply 15 mLs (1 Application total) topically as directed for 30 doses. Use as directed daily for 5 days every other week for 6 weeks.   gabapentin 600 MG tablet Commonly known as: NEURONTIN Take 1 tablet (600 mg total) by mouth 4 (four) times daily.   ibuprofen 200 MG tablet Commonly known as: ADVIL Take 400 mg by mouth every 6 (six) hours as needed for moderate pain (pain score 4-6).   levothyroxine 25 MCG tablet Commonly known as: SYNTHROID Take 1 tablet (25 mcg total) by mouth daily in the morning on an empty stomach.   losartan 25 MG tablet Commonly known as: COZAAR Take 1 tablet (25 mg total) by mouth daily.   metFORMIN 500 MG 24 hr tablet Commonly known as: GLUCOPHAGE-XR Take 2 tablets (1,000 mg total) by mouth 2 (two) times daily.   metoprolol succinate 100 MG 24 hr tablet Commonly known as: TOPROL-XL Take 1 tablet (100 mg total) by mouth daily.   mupirocin ointment 2 % Commonly known as: BACTROBAN Place 1 Application into the nose 2 (two) times daily. Use as directed 2 times daily for 5 days every other week for 6 weeks.   oxyCODONE 5 MG immediate release tablet Commonly known as: Oxy IR/ROXICODONE Take 1-2 tablets (5-10 mg total) by mouth every 4 (four) hours as needed for moderate pain (pain score 4-6) (pain score 4-6).   Ozempic (0.25 or 0.5 MG/DOSE) 2 MG/3ML Sopn Generic drug:  Semaglutide(0.25 or 0.5MG /DOS) Inject 0.25 mg into the skin once a week.   Ozempic (1 MG/DOSE) 4 MG/3ML Sopn Generic drug: Semaglutide (1 MG/DOSE) Inject 1 mg into the skin once a week.   tadalafil  20 MG tablet Commonly known as: CIALIS Take 20 mg by mouth daily as needed for erectile dysfunction.   testosterone cypionate 200 MG/ML injection Commonly known as: DEPOTESTOSTERONE CYPIONATE Inject 200 mg into the skin every 30 (thirty) days.   tiZANidine 4 MG tablet Commonly known as: ZANAFLEX Take 1 tablet (4 mg total) by mouth every 6 (six) hours as needed for muscle spasms.   Vilazodone HCl 40 MG Tabs Commonly known as: VIIBRYD Take 1 tablet (40 mg total) by mouth daily with food   Vitamin D (Ergocalciferol) 1.25 MG (50000 UNIT) Caps capsule Commonly known as: DRISDOL Take 50,000 Units by mouth once a week.               Durable Medical Equipment  (From admission, onward)           Start     Ordered   12/09/23 1425  For home use only DME Walker rolling  Once       Question Answer Comment  Walker: With 5 Inch Wheels   Patient needs a walker to treat with the following condition H/O total hip arthroplasty      12/09/23 1428   12/09/23 1424  For home use only DME Bedside commode  Once       Question:  Patient needs a bedside commode to treat with the following condition  Answer:  H/O total hip arthroplasty   12/09/23 1428   12/08/23 1737  DME 3 n 1  Once        12/08/23 1736   12/08/23 1737  DME Walker rolling  Once       Question Answer Comment  Walker: With 5 Inch Wheels   Patient needs a walker to treat with the following condition Status post total replacement of left hip      12/08/23 1736            Diagnostic Studies: DG Pelvis Portable Result Date: 12/08/2023 CLINICAL DATA:  Status post total left hip arthroplasty. EXAM: PORTABLE PELVIS 1-2 VIEWS COMPARISON:  Pelvis and left hip radiographs 10/30/2023 FINDINGS: Status post total left hip  arthroplasty, new from prior. Redemonstration of total right hip arthroplasty. No perihardware lucency is seen to indicate hardware failure or loosening. Expected postoperative changes of lateral left hip subcutaneous air. Moderate bilateral sacroiliac joint space narrowing and subchondral sclerosis. No acute fracture or dislocation. IMPRESSION: Status post total left hip arthroplasty without evidence of hardware failure. Electronically Signed   By: Neita Garnet M.D.   On: 12/08/2023 16:53   DG HIP UNILAT WITH PELVIS 1V LEFT Result Date: 12/08/2023 CLINICAL DATA:  Total left hip arthroplasty. Intraoperative fluoroscopy. EXAM: DG HIP (WITH OR WITHOUT PELVIS) 1V*L* COMPARISON:  Pelvis and left hip radiographs 10/30/2023 FINDINGS: Images were performed intraoperatively without the presence of a radiologist. Interval total left hip arthroplasty. Partial visualization of prior total right hip arthroplasty. No hardware complication is seen. Total fluoroscopy images: 3 Total fluoroscopy time: 30 seconds Total dose: Radiation Exposure Index (as provided by the fluoroscopic device): 5.02 mGy air Kerma Please see intraoperative findings for further detail. IMPRESSION: Intraoperative fluoroscopy for total left hip arthroplasty. Electronically Signed   By: Neita Garnet M.D.   On: 12/08/2023 16:51   DG C-Arm 1-60 Min-No Report Result Date: 12/08/2023 Fluoroscopy was utilized by the requesting physician.  No radiographic interpretation.   DG C-Arm 1-60 Min-No Report Result Date: 12/08/2023 Fluoroscopy was utilized by the requesting physician.  No radiographic interpretation.    Disposition: Discharge  disposition: 01-Home or Self Care          Follow-up Information     Kathryne Hitch, MD Follow up in 2 week(s).   Specialty: Orthopedic Surgery Contact information: [address] [place] [address] [phone]                  Signed: Kathryne Hitch 12/09/2023,  3:10 PM

## 2023-12-09 NOTE — Progress Notes (Signed)
Subjective: 1 Day Post-Op Procedure(s) (LRB): LEFT TOTAL HIP ARTHROPLASTY ANTERIOR APPROACH (Left) Patient reports pain as moderate.    Objective: Vital signs in last 24 hours: Temp:  [97.6 F (36.4 C)-99 F (37.2 C)] 98.9 F (37.2 C) (12/21 1001) Pulse Rate:  [70-130] 85 (12/21 1001) Resp:  [11-21] 15 (12/21 1001) BP: (114-156)/(81-100) 145/83 (12/21 1001) SpO2:  [89 %-100 %] 100 % (12/21 1001) Weight:  [865 kg] 111 kg (12/20 1121)  Intake/Output from previous day: 12/20 0701 - 12/21 0700 In: 2252.6 [P.O.:840; I.V.:1312.6; IV Piggyback:100] Out: 2675 [Urine:2175; Blood:500] Intake/Output this shift: Total I/O In: 240 [P.O.:240] Out: 150 [Urine:150]  Recent Labs    12/09/23 0343  HGB 9.8*   Recent Labs    12/09/23 0343  WBC 9.8  RBC 3.49*  HCT 30.1*  PLT 212   Recent Labs    12/08/23 1612 12/09/23 0343  NA 139 136  K 5.2* 5.3*  CL 106 105  CO2 25 24  BUN 28* 27*  CREATININE 1.43* 1.23  GLUCOSE 197* 260*  CALCIUM 8.8* 8.8*   No results for input(s): "LABPT", "INR" in the last 72 hours.  Sensation intact distally Intact pulses distally Dorsiflexion/Plantar flexion intact Incision: dressing C/D/I   Assessment/Plan: 1 Day Post-Op Procedure(s) (LRB): LEFT TOTAL HIP ARTHROPLASTY ANTERIOR APPROACH (Left) Up with therapy Discharge home with home health this afternoon.      Kathryne Hitch 12/09/2023, 10:34 AM

## 2023-12-09 NOTE — Progress Notes (Signed)
Transition of Care [facility]) - Inpatient Brief Assessment   Patient Details  Name: Brandon Gordon MRN: [id number] Date of Birth: Nov 15, 1971  [facility]) CM/SW Contact:    Georgie Chard, LCSW Phone Number: 12/09/2023, 2:24 PM   Clinical Narrative: CSW spoke to family. At this time they have requested DME 3-1 as well RW. CSW has ordered through [employer]. DME will deliver to patients room. No further TOC needs at this time.    Transition of Care Asessment: Insurance and Status: Insurance coverage has been reviewed Patient has primary care physician: Yes Home environment has been reviewed: Yes home with spouse Prior level of function:: walking Prior/Current Home Services: No current home services Social Drivers of Health Review: SDOH reviewed no interventions necessary Readmission risk has been reviewed: Yes Transition of care needs: no transition of care needs at this time

## 2023-12-09 NOTE — Plan of Care (Signed)
  Problem: Activity: Goal: Risk for activity intolerance will decrease Outcome: Progressing   Problem: Coping: Goal: Level of anxiety will decrease Outcome: Progressing   Problem: Pain Management: Goal: General experience of comfort will improve Outcome: Progressing

## 2023-12-11 ENCOUNTER — Encounter (HOSPITAL_COMMUNITY): Payer: Self-pay | Admitting: Orthopaedic Surgery

## 2023-12-11 ENCOUNTER — Other Ambulatory Visit: Payer: Self-pay

## 2023-12-12 NOTE — Anesthesia Postprocedure Evaluation (Signed)
Anesthesia Post Note  Patient: Brandon Gordon  Procedure(s) Performed: LEFT TOTAL HIP ARTHROPLASTY ANTERIOR APPROACH (Left: Hip)     Patient location during evaluation: PACU Anesthesia Type: Spinal and MAC Level of consciousness: awake and alert and oriented Pain management: pain level controlled Vital Signs Assessment: post-procedure vital signs reviewed and stable Respiratory status: spontaneous breathing, nonlabored ventilation and respiratory function stable Cardiovascular status: blood pressure returned to baseline and stable Postop Assessment: no headache, no backache, spinal receding and no apparent nausea or vomiting Anesthetic complications: no   No notable events documented.  Last Vitals:  Vitals:   12/09/23 1001 12/09/23 1318  BP: (!) 145/83 (!) 157/92  Pulse: 85 74  Resp: 15 17  Temp: 37.2 C 36.8 C  SpO2: 100% 100%    Last Pain:  Vitals:   12/09/23 1318  TempSrc:   PainSc: 8                  Lannie Fields

## 2023-12-14 ENCOUNTER — Telehealth: Payer: Self-pay | Admitting: Orthopaedic Surgery

## 2023-12-14 NOTE — Telephone Encounter (Signed)
Kim palmer with [facility] wants to let you know that they starting home health tomorrow. [phone]

## 2023-12-14 NOTE — Telephone Encounter (Signed)
NOTED

## 2023-12-18 ENCOUNTER — Other Ambulatory Visit (HOSPITAL_COMMUNITY): Payer: Self-pay

## 2023-12-19 ENCOUNTER — Other Ambulatory Visit (HOSPITAL_COMMUNITY): Payer: Self-pay

## 2023-12-19 ENCOUNTER — Other Ambulatory Visit: Payer: Self-pay

## 2023-12-21 ENCOUNTER — Encounter: Payer: Self-pay | Admitting: Orthopaedic Surgery

## 2023-12-21 ENCOUNTER — Ambulatory Visit: Payer: 59 | Admitting: Orthopaedic Surgery

## 2023-12-21 ENCOUNTER — Other Ambulatory Visit (HOSPITAL_COMMUNITY): Payer: Self-pay

## 2023-12-21 DIAGNOSIS — Z96642 Presence of left artificial hip joint: Secondary | ICD-10-CM

## 2023-12-21 MED ORDER — HYDROCODONE-ACETAMINOPHEN 5-325 MG PO TABS
1.0000 | ORAL_TABLET | Freq: Four times a day (QID) | ORAL | 0 refills | Status: DC | PRN
  Filled 2023-12-21: qty 30, 4d supply, fill #0

## 2023-12-21 NOTE — Progress Notes (Signed)
 The patient comes in for his first postoperative visit status post a left total hip arthroplasty.  We replaced his right hip many years ago.  He is now standing up better and is able to move his hip better than before surgery.  He is someone who has been in chronic pain management.  He has been on aspirin  twice a day and compliant with the aspirin  twice a day.  On exam his left hip incision looks good.  The staples have been removed and Steri-Strips applied.  He does have a small seroma that I did aspirate.  Overall things do look good for him.  He is going to transition to hydrocodone  from oxycodone .  From my standpoint we will see him back in a month to see how he is doing overall but no x-rays are needed.  If there are issues before then he knows to reach out and let us  know.

## 2023-12-22 ENCOUNTER — Other Ambulatory Visit (HOSPITAL_COMMUNITY): Payer: Self-pay

## 2023-12-22 MED ORDER — METOPROLOL SUCCINATE ER 100 MG PO TB24
100.0000 mg | ORAL_TABLET | Freq: Every day | ORAL | 0 refills | Status: DC
  Filled 2023-12-22: qty 90, 90d supply, fill #0

## 2023-12-26 ENCOUNTER — Other Ambulatory Visit (HOSPITAL_COMMUNITY): Payer: Self-pay

## 2023-12-27 ENCOUNTER — Other Ambulatory Visit: Payer: Self-pay

## 2024-01-03 DIAGNOSIS — R42 Dizziness and giddiness: Secondary | ICD-10-CM | POA: Diagnosis not present

## 2024-01-03 DIAGNOSIS — E291 Testicular hypofunction: Secondary | ICD-10-CM | POA: Diagnosis not present

## 2024-01-03 DIAGNOSIS — D649 Anemia, unspecified: Secondary | ICD-10-CM | POA: Diagnosis not present

## 2024-01-03 DIAGNOSIS — E1165 Type 2 diabetes mellitus with hyperglycemia: Secondary | ICD-10-CM | POA: Diagnosis not present

## 2024-01-03 DIAGNOSIS — I10 Essential (primary) hypertension: Secondary | ICD-10-CM | POA: Diagnosis not present

## 2024-01-05 ENCOUNTER — Other Ambulatory Visit (HOSPITAL_COMMUNITY): Payer: Self-pay

## 2024-01-05 DIAGNOSIS — E875 Hyperkalemia: Secondary | ICD-10-CM | POA: Diagnosis not present

## 2024-01-05 MED ORDER — PANTOPRAZOLE SODIUM 40 MG PO TBEC
40.0000 mg | DELAYED_RELEASE_TABLET | Freq: Every morning | ORAL | 0 refills | Status: AC
  Filled 2024-01-05: qty 30, 30d supply, fill #0

## 2024-01-05 MED ORDER — ONDANSETRON HCL 8 MG PO TABS
8.0000 mg | ORAL_TABLET | Freq: Three times a day (TID) | ORAL | 1 refills | Status: AC | PRN
  Filled 2024-01-05: qty 30, 10d supply, fill #0

## 2024-01-05 MED ORDER — LOSARTAN POTASSIUM 50 MG PO TABS
50.0000 mg | ORAL_TABLET | Freq: Every day | ORAL | 5 refills | Status: DC
  Filled 2024-01-05: qty 30, 30d supply, fill #0

## 2024-01-09 ENCOUNTER — Other Ambulatory Visit (HOSPITAL_COMMUNITY): Payer: Self-pay

## 2024-01-10 ENCOUNTER — Other Ambulatory Visit (HOSPITAL_COMMUNITY): Payer: Self-pay

## 2024-01-10 MED ORDER — LEVOTHYROXINE SODIUM 25 MCG PO TABS
25.0000 ug | ORAL_TABLET | Freq: Every morning | ORAL | 0 refills | Status: DC
  Filled 2024-01-10: qty 30, 30d supply, fill #0

## 2024-01-10 MED ORDER — METFORMIN HCL ER 500 MG PO TB24
1000.0000 mg | ORAL_TABLET | Freq: Two times a day (BID) | ORAL | 0 refills | Status: DC
  Filled 2024-01-10: qty 360, 90d supply, fill #0

## 2024-01-11 ENCOUNTER — Other Ambulatory Visit (HOSPITAL_COMMUNITY): Payer: Self-pay

## 2024-01-11 DIAGNOSIS — F1120 Opioid dependence, uncomplicated: Secondary | ICD-10-CM | POA: Diagnosis not present

## 2024-01-11 DIAGNOSIS — E291 Testicular hypofunction: Secondary | ICD-10-CM | POA: Diagnosis not present

## 2024-01-11 MED ORDER — ZUBSOLV 0.7-0.18 MG SL SUBL
1.0000 | SUBLINGUAL_TABLET | Freq: Every day | SUBLINGUAL | 0 refills | Status: DC
  Filled 2024-01-11: qty 30, 30d supply, fill #0

## 2024-01-12 ENCOUNTER — Other Ambulatory Visit (HOSPITAL_COMMUNITY): Payer: Self-pay

## 2024-01-18 ENCOUNTER — Encounter: Payer: 59 | Admitting: Orthopaedic Surgery

## 2024-01-23 ENCOUNTER — Other Ambulatory Visit (HOSPITAL_COMMUNITY): Payer: Self-pay

## 2024-01-23 DIAGNOSIS — E538 Deficiency of other specified B group vitamins: Secondary | ICD-10-CM | POA: Diagnosis not present

## 2024-01-23 DIAGNOSIS — E782 Mixed hyperlipidemia: Secondary | ICD-10-CM | POA: Diagnosis not present

## 2024-01-23 DIAGNOSIS — E1165 Type 2 diabetes mellitus with hyperglycemia: Secondary | ICD-10-CM | POA: Diagnosis not present

## 2024-01-23 DIAGNOSIS — E039 Hypothyroidism, unspecified: Secondary | ICD-10-CM | POA: Diagnosis not present

## 2024-01-23 DIAGNOSIS — G609 Hereditary and idiopathic neuropathy, unspecified: Secondary | ICD-10-CM | POA: Diagnosis not present

## 2024-01-23 DIAGNOSIS — F419 Anxiety disorder, unspecified: Secondary | ICD-10-CM | POA: Diagnosis not present

## 2024-01-23 DIAGNOSIS — I10 Essential (primary) hypertension: Secondary | ICD-10-CM | POA: Diagnosis not present

## 2024-01-23 DIAGNOSIS — E291 Testicular hypofunction: Secondary | ICD-10-CM | POA: Diagnosis not present

## 2024-01-23 MED ORDER — ALPRAZOLAM 1 MG PO TABS
2.0000 mg | ORAL_TABLET | Freq: Every day | ORAL | 3 refills | Status: DC | PRN
  Filled 2024-01-23: qty 60, 30d supply, fill #0
  Filled 2024-02-26: qty 60, 30d supply, fill #1
  Filled 2024-03-28: qty 60, 30d supply, fill #2
  Filled 2024-06-17: qty 60, 30d supply, fill #3

## 2024-01-23 MED ORDER — LOSARTAN POTASSIUM 100 MG PO TABS
100.0000 mg | ORAL_TABLET | Freq: Every day | ORAL | 5 refills | Status: AC
  Filled 2024-01-23: qty 30, 30d supply, fill #0
  Filled 2024-02-26: qty 30, 30d supply, fill #1
  Filled 2024-03-25: qty 30, 30d supply, fill #2
  Filled 2024-04-24: qty 30, 30d supply, fill #3
  Filled 2024-05-22: qty 30, 30d supply, fill #4
  Filled 2024-06-24: qty 30, 30d supply, fill #5

## 2024-01-23 MED ORDER — AMLODIPINE BESYLATE 5 MG PO TABS
5.0000 mg | ORAL_TABLET | Freq: Every day | ORAL | 5 refills | Status: DC
  Filled 2024-01-23: qty 30, 30d supply, fill #0
  Filled 2024-02-14 – 2024-02-16 (×2): qty 30, 30d supply, fill #1
  Filled 2024-03-25: qty 30, 30d supply, fill #2
  Filled 2024-04-24: qty 30, 30d supply, fill #3
  Filled 2024-05-22: qty 30, 30d supply, fill #4
  Filled 2024-06-24: qty 30, 30d supply, fill #5

## 2024-01-31 ENCOUNTER — Ambulatory Visit (INDEPENDENT_AMBULATORY_CARE_PROVIDER_SITE_OTHER): Payer: 59 | Admitting: Orthopaedic Surgery

## 2024-01-31 ENCOUNTER — Encounter: Payer: Self-pay | Admitting: Orthopaedic Surgery

## 2024-01-31 DIAGNOSIS — Z96642 Presence of left artificial hip joint: Secondary | ICD-10-CM

## 2024-01-31 NOTE — Progress Notes (Signed)
Lorin Picket comes in today about 7 weeks status post a left total hip replacement.  He is looking much better overall.  He does not stand up straight but that is mainly due to chronic spine issues with severe scoliosis.  He says his hip is doing great.  On exam he does have good range of motion of his left hip.  I see no issues with that at all.  His incision looks good.  There is minimal swelling.  Overall his gait is improved and a lot of his issues are related to just his back.  He is still occasionally felt some lightheaded and I believe he is anemic from surgery and that we will slowly build back up with time and I let him know that as well.  From my standpoint we will see him back in 6 months with a standing low AP pelvis and lateral of the more recent left operative hip.  If he wants to try to get back in outpatient therapy just for generalized strengthening conditioning of his core he will let us know and we can set that up again.

## 2024-02-03 ENCOUNTER — Other Ambulatory Visit (HOSPITAL_COMMUNITY): Payer: Self-pay

## 2024-02-05 ENCOUNTER — Other Ambulatory Visit: Payer: Self-pay

## 2024-02-05 ENCOUNTER — Other Ambulatory Visit (HOSPITAL_COMMUNITY): Payer: Self-pay

## 2024-02-05 MED ORDER — LEVOTHYROXINE SODIUM 25 MCG PO TABS
25.0000 ug | ORAL_TABLET | Freq: Every morning | ORAL | 3 refills | Status: AC
  Filled 2024-02-05: qty 90, 90d supply, fill #0
  Filled 2024-02-14 – 2024-05-03 (×2): qty 90, 90d supply, fill #1
  Filled 2024-08-07: qty 90, 90d supply, fill #2
  Filled 2024-11-01: qty 90, 90d supply, fill #3

## 2024-02-06 ENCOUNTER — Other Ambulatory Visit (HOSPITAL_COMMUNITY): Payer: Self-pay

## 2024-02-07 ENCOUNTER — Other Ambulatory Visit (HOSPITAL_COMMUNITY): Payer: Self-pay

## 2024-02-09 ENCOUNTER — Other Ambulatory Visit (HOSPITAL_COMMUNITY): Payer: Self-pay

## 2024-02-09 DIAGNOSIS — F1120 Opioid dependence, uncomplicated: Secondary | ICD-10-CM | POA: Diagnosis not present

## 2024-02-09 MED ORDER — ZUBSOLV 0.7-0.18 MG SL SUBL
1.0000 | SUBLINGUAL_TABLET | Freq: Every day | SUBLINGUAL | 0 refills | Status: DC
  Filled 2024-02-09 – 2024-02-12 (×2): qty 30, 30d supply, fill #0

## 2024-02-12 ENCOUNTER — Other Ambulatory Visit (HOSPITAL_COMMUNITY): Payer: Self-pay

## 2024-02-13 DIAGNOSIS — Z79899 Other long term (current) drug therapy: Secondary | ICD-10-CM | POA: Diagnosis not present

## 2024-02-14 ENCOUNTER — Other Ambulatory Visit (HOSPITAL_COMMUNITY): Payer: Self-pay

## 2024-02-15 ENCOUNTER — Other Ambulatory Visit (HOSPITAL_COMMUNITY): Payer: Self-pay

## 2024-02-15 DIAGNOSIS — E291 Testicular hypofunction: Secondary | ICD-10-CM | POA: Diagnosis not present

## 2024-02-15 MED ORDER — VILAZODONE HCL 40 MG PO TABS
40.0000 mg | ORAL_TABLET | Freq: Every day | ORAL | 0 refills | Status: DC
  Filled 2024-02-15: qty 90, 90d supply, fill #0

## 2024-02-16 ENCOUNTER — Other Ambulatory Visit: Payer: Self-pay

## 2024-02-26 ENCOUNTER — Other Ambulatory Visit (HOSPITAL_COMMUNITY): Payer: Self-pay

## 2024-02-26 ENCOUNTER — Other Ambulatory Visit: Payer: Self-pay

## 2024-03-04 ENCOUNTER — Other Ambulatory Visit: Payer: Self-pay

## 2024-03-04 ENCOUNTER — Telehealth: Payer: Self-pay | Admitting: Orthopaedic Surgery

## 2024-03-04 DIAGNOSIS — Z96642 Presence of left artificial hip joint: Secondary | ICD-10-CM

## 2024-03-04 NOTE — Telephone Encounter (Signed)
 Sent referral

## 2024-03-04 NOTE — Telephone Encounter (Signed)
 Pt called requesting  referral be sent for Outpatient physical therapy . Please call pt at [phone].

## 2024-03-06 ENCOUNTER — Other Ambulatory Visit (HOSPITAL_COMMUNITY): Payer: Self-pay

## 2024-03-06 MED ORDER — GABAPENTIN 600 MG PO TABS
600.0000 mg | ORAL_TABLET | Freq: Four times a day (QID) | ORAL | 0 refills | Status: DC
Start: 2024-03-06 — End: 2024-06-07
  Filled 2024-03-06: qty 360, 90d supply, fill #0

## 2024-03-07 NOTE — Therapy (Signed)
 OUTPATIENT PHYSICAL THERAPY LOWER EXTREMITY EVALUATION   Patient Name: Brandon Gordon MRN: [id number] DOB:05/22/1971, 53 y.o., male Today's Date: 03/12/2024  END OF SESSION:  PT End of Session - 03/12/24 1611     Visit Number 1    Number of Visits 9    Date for PT Re-Evaluation 05/07/24    Authorization Type [employer]    PT Start Time [phone]    PT Stop Time 1635    PT Time Calculation (min) 45 min    Activity Tolerance Patient limited by pain    Behavior During Therapy [facility] for tasks assessed/performed             Past Medical History:  Diagnosis Date   Anxiety    At risk for sleep apnea    STOP-BANG= 5      SENT TO PCP 04-05-2016   Chronic pain syndrome    History of kidney stones    Hyperlipidemia    Hypertension    Left ureteral stone    OA (osteoarthritis)    back, hip   Pain management    Type 2 diabetes mellitus (HCC)    Past Surgical History:  Procedure Laterality Date   TOE SURGERY Left    left 5th toe bone excision   TOTAL HIP ARTHROPLASTY Right 04/22/2011   TOTAL HIP ARTHROPLASTY Left 12/08/2023   Procedure: LEFT TOTAL HIP ARTHROPLASTY ANTERIOR APPROACH;  Surgeon: Kathryne Hitch, MD;  Location: [facility];  Service: Orthopedics;  Laterality: Left;   Patient Active Problem List   Diagnosis Date Noted   Status post total replacement of left hip 12/08/2023    PCP: Noberto Retort, MD   REFERRING PROVIDER: Kathryne Hitch, MD   REFERRING DIAG: [phone] (ICD-10-CM) - Status post total replacement of left hip   THERAPY DIAG:  Cervicalgia - Plan: PT plan of care cert/re-cert  Chronic bilateral low back pain with right-sided sciatica - Plan: PT plan of care cert/re-cert  Pain in thoracic spine - Plan: PT plan of care cert/re-cert  Abnormal posture - Plan: PT plan of care cert/re-cert  Other abnormalities of gait and mobility - Plan: PT plan of care cert/re-cert  Pain in left hip - Plan: PT plan of care  cert/re-cert  Rationale for Evaluation and Treatment: Rehabilitation  ONSET DATE: Left hip THA surgery 12-07-24  SUBJECTIVE:   SUBJECTIVE STATEMENT: My Left THA  anterior approach on 12-07-24 by Dr Allie Bossier.  I am now walking without AD. I got rid of my cane after 4 days.  I tried to do it all myself after I had PT at the hospital. I have not worked out as much as I have before I had hip surgery.  I have scoliosis.  I have a lot of neck pain 7-8/10 down to thoracic and down toward my right hip since surgery and I  have re aligned in a way, I often pop my neck sporadically.  I have gained 10 lb since surgery as well. Daily headaches sometimes 2 a day. Difficulty sleeping and cannot sleep on left side  PERTINENT HISTORY: Chronic pain syndrome, hyperlipidemia, HTN, OA DM Type 2, Pain managament. THR R 12 years ago May 2012.  Left shoulder hypermobility and chronic dislocation since basketball days. Surgery of 5th toe infection March 2003. Anxiety, THA Left 12-07-24. Boxer break 5th digit R hand PAIN:  Are you having pain? Yes: NPRS scale: Left hip 0/10, at worst 2/10, neck at rest 6.10 at worst  8/10,  thoracic pain/back at rest 5/10 at worst 9/10 Pain location: Right neck and right thoracic and bil low back radiating into R hip Pain description: aching and sharp Aggravating factors: sitting longer than 1 hour, standing for 10 min, sleeping, ; Relieving factors: nothing  PRECAUTIONS: Anterior hip  RED FLAGS: None   WEIGHT BEARING RESTRICTIONS: Yes WBAT L  FALLS:  Has patient fallen in last 6 months? Yes. 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 Pain due to new realignment post surgery  NEXT MD VISIT: TBD  OBJECTIVE:  Note: Objective measures were completed at Evaluation unless otherwise noted.  DIAGNOSTIC FINDINGS: See medical record  PATIENT SURVEYS:  LEFS 47/80 58.8%   NDI  25.50  50% COGNITION: Overall cognitive status: Within functional limits for tasks assessed     SENSATION: Has some numbness in bil hands and feet pt attributes to anemia  sometimes tingling on top of feet and I sometimes have fasciculations    MUSCLE LENGTH: :Thomas test Right WNL able to flatten deg; Left -15  from horizontal deg   POSTURE: Leans to the left and rotated to the left  Rounded shoulders, forward head, and scoliosis with R elevated pelvis and scoliotic curve leaning to the right in sitting and standing with flexed and  rotated to Left posture  PALPATION: Tightened Left Quadratus Lumborum, bil hamstring tightness  CERVICAL ROM:   Active ROM A/PROM (deg) eval  Flexion 32  Extension 62  Right lateral flexion 12  Left lateral flexion 24  Right rotation 40  Left rotation 62   (Blank rows = not tested)  LOWER EXTREMITY ROM:  Active ROM Right eval Left eval  Hip flexion 110 105  Hip extension    Hip abduction    Hip adduction    Hip internal rotation    Hip external rotation    Knee flexion    Knee extension    Ankle dorsiflexion    Ankle plantarflexion    Ankle inversion    Ankle eversion     (Blank rows = not tested)  LOWER EXTREMITY MMT:  MMT Right eval Left eval  Hip flexion 5 4  Hip extension 5 4  Hip abduction 5 3+  Hip adduction    Hip internal rotation    Hip external rotation    Knee flexion    Knee extension 5 5  Ankle dorsiflexion 5 5  Ankle plantarflexion    Ankle inversion    Ankle  eversion     (Blank rows = not tested)  LOWER EXTREMITY SPECIAL TESTS:    FUNCTIONAL TESTS:  2 minute walk test: 339.5  GAIT: Distance walked: 150 Assistive device utilized: None Level of assistance: Complete Independence Comments: Walks with  TREATMENT DATE: eval and TPDN Trigger Point Dry Needling  Initial Treatment: Pt instructed on Dry Needling rational, procedures, and possible side effects. Pt instructed to expect mild to moderate muscle soreness later in the day and/or into the next day.  Pt instructed in methods to reduce muscle soreness. Pt instructed to continue prescribed HEP. Because Dry Needling was performed over or adjacent to a lung field, pt was educated on S/S of pneumothorax and to seek immediate medical attention should they occur.  Patient was educated on signs and symptoms of infection and other risk factors and advised to seek medical attention should they occur.  Patient verbalized understanding of these instructions and education.   Patient Verbal Consent Given: Yes Education Handout Provided: Yes Muscles Treated: Right UT and LS and rhomboids Electrical Stimulation Performed: No Treatment Response/Outcome: muscle twitch and release of muscle tension and pain      PATIENT EDUCATION:  Education details: POC Explanation of findings Person educated: Patient Education method: Programmer, multimedia, Demonstration, and Handouts Education comprehension: verbalized understanding and needs further education  HOME EXERCISE PROGRAM: TBD  ASSESSMENT:  CLINICAL IMPRESSION: Patient is a 53 y.o. male who was seen today for physical therapy evaluation and treatment for S/P L THA on 12-07-24 by Dr Magnus Ivan. Pt also has subsequent neck pain on Right and Right upper back/low back due to new realignment  post surgery and subsequent  compensations.  and needs skilled PT to address impairments limiting  AROM in cervical area and R sided thoracic and back pain  OBJECTIVE IMPAIRMENTS: decreased activity tolerance, decreased knowledge of condition, decreased mobility, difficulty walking, decreased ROM, decreased strength, increased fascial restrictions, improper body mechanics, postural dysfunction, and pain.   ACTIVITY LIMITATIONS: carrying, lifting, bending, sitting, standing, squatting, sleeping, stairs, locomotion level, and caring for others  PARTICIPATION LIMITATIONS: driving, community activity, yard work, church, and caring for special needs son  PERSONAL FACTORS: Chronic pain syndrome, hyperlipidemia, HTN, OA DM Type 2, Pain managament. THR R 12 years ago May 2012.  Left shoulder hypermobility and chronic dislocation since basketball days. Surgery of 5th toe infection March 2003. Anxiety, THA Left 12-07-24. Boxer break 5th digit R hand and scoliosis moderate are also affecting patient's functional outcome.   REHAB POTENTIAL: Good  CLINICAL DECISION MAKING: Evolving/moderate complexity  EVALUATION COMPLEXITY: Moderate   GOALS: Goals reviewed with patient? Yes  SHORT TERM GOALS: Target date: Apr 18, 2024  Pt will be independent with initial HEP Baseline:no knowledge Goal status: INITIAL  2.    Demonstrate understanding of  as neutral posture as possible with scoliosis and be more conscious of position and posture throughout the day.  Baseline: needs reinforcement Goal status: INITIAL  3.  Pt will be educated on going up and down steps forward motion without exacerbating pain Baseline: Pt descends backwards due to large feet and pain in left hip now alleviated by surgery Goal status: INITIAL  4.  Pt will perform DGI for balance assessment Baseline: Pt has fallen due to anemia but needs further balance assessment Goal status: INITIAL    LONG TERM GOALS: Target date: 05-21-24  Pt will be able to perform advanced HEP using  gym  and lifting  Baseline: Pt post surgery since 12-07-24 but still not maximized in strength Goal status: INITIAL  2.  Pt will improve neck AROM in left lateral flexion by at least 10 degrees and improve pain by 50% in order to scan environment with greater ease Baseline:  Goal status: INITIAL  3.  Pt  will be able to walk a mile without cramping and pain in R LE Baseline: Pt not able to walk for more than 2 minutes at eval Goal status: INITIAL  4.  Pt will be able to sleep 4 or more hours of restorative sleep and be able to sleep on left side Baseline: only sleeps on Right side. Goal status: INITIAL  5.  Pt will improve LEFS to at least 60/80 to show improved functional mobility Baseline: Eval 47/80 58.8% Goal status: INITIAL  6.  Pt will improved NDI to at least 36/50 to show improved neck and UE ability to perform daily duties. Baseline: Eval 25/50  50% Goal status: INITIAL    7. Pt will be able to perform 6 MWT to within normal limites   Baseline, 2 MWT339.5 ft   Goal status INITIAL   PLAN:  PT FREQUENCY: 1-2x/week  PT DURATION: 10 weeks  PLANNED INTERVENTIONS: 97164- PT Re-evaluation, 97110-Therapeutic exercises, 97530- Therapeutic activity, 97112- Neuromuscular re-education, 97535- Self Care, 16109- Manual therapy, [phone]- Gait training, [phone]- Electrical stimulation (manual), Patient/Family education, Balance training, Stair training, Taping, Dry Needling, Joint mobilization, Spinal mobilization, Cryotherapy, and Moist heat  PLAN FOR NEXT SESSION: TPDN, Manual, issue HEP, gait training Do DGI   Garen Lah, PT, ATRIC Certified Exercise Expert for the Aging Adult  03/12/24 5:25 PM Phone: [phone] Fax: [phone]

## 2024-03-12 ENCOUNTER — Encounter: Payer: Self-pay | Admitting: Physical Therapy

## 2024-03-12 ENCOUNTER — Other Ambulatory Visit (HOSPITAL_COMMUNITY): Payer: Self-pay

## 2024-03-12 ENCOUNTER — Other Ambulatory Visit: Payer: Self-pay

## 2024-03-12 ENCOUNTER — Ambulatory Visit: Attending: Orthopaedic Surgery | Admitting: Physical Therapy

## 2024-03-12 DIAGNOSIS — M25552 Pain in left hip: Secondary | ICD-10-CM | POA: Diagnosis present

## 2024-03-12 DIAGNOSIS — M546 Pain in thoracic spine: Secondary | ICD-10-CM | POA: Insufficient documentation

## 2024-03-12 DIAGNOSIS — R293 Abnormal posture: Secondary | ICD-10-CM | POA: Insufficient documentation

## 2024-03-12 DIAGNOSIS — M5441 Lumbago with sciatica, right side: Secondary | ICD-10-CM | POA: Insufficient documentation

## 2024-03-12 DIAGNOSIS — M542 Cervicalgia: Secondary | ICD-10-CM | POA: Diagnosis present

## 2024-03-12 DIAGNOSIS — G8929 Other chronic pain: Secondary | ICD-10-CM | POA: Insufficient documentation

## 2024-03-12 DIAGNOSIS — R2689 Other abnormalities of gait and mobility: Secondary | ICD-10-CM | POA: Diagnosis present

## 2024-03-12 DIAGNOSIS — Z96642 Presence of left artificial hip joint: Secondary | ICD-10-CM | POA: Diagnosis not present

## 2024-03-12 NOTE — Patient Instructions (Signed)
 Trigger Point Dry Needling  What is Trigger Point Dry Needling (DN)? DN is a physical therapy technique used to treat muscle pain and dysfunction. Specifically, DN helps deactivate muscle trigger points (muscle knots).  A thin filiform needle is used to penetrate the skin and stimulate the underlying trigger point. The goal is for a local twitch response (LTR) to occur and for the trigger point to relax. No medication of any kind is injected during the procedure.   What Does Trigger Point Dry Needling Feel Like?  The procedure feels different for each individual patient. Some patients report that they do not actually feel the needle enter the skin and overall the process is not painful. Very mild bleeding may occur. However, many patients feel a deep cramping in the muscle in which the needle was inserted. This is the local twitch response.   How Will I feel after the treatment? Soreness is normal, and the onset of soreness may not occur for a few hours. Typically this soreness does not last longer than two days.  Bruising is uncommon, however; ice can be used to decrease any possible bruising.  In rare cases feeling tired or nauseous after the treatment is normal. In addition, your symptoms may get worse before they get better, this period will typically not last longer than 24 hours.   What Can I do After My Treatment? Increase your hydration by drinking more water for the next 24 hours.  You may place ice or heat on the areas treated that have become sore, however, do not use heat on inflamed or bruised areas. Heat often brings more relief post needling. You can continue your regular activities, but vigorous activity is not recommended initially after the treatment for 24 hours. DN is best combined with other physical therapy such as strengthening, stretching, and other therapies.   What are the complications? While your therapist has had extensive training in minimizing the risks of trigger  point dry needling, it is important to understand the risks of any procedure.  Risks include bleeding, pain, fatigue, hematoma, infection, vertigo, nausea or nerve involvement. Monitor for any changes to your skin or sensation. Contact your therapist or MD with concerns.  A rare but serious complication is a pneumothorax over or near your middle and upper chest and back If you have dry needling in this area, monitor for the following symptoms: Shortness of breath on exertion and/or Difficulty taking a deep breath and/or Chest Pain and/or A dry cough If any of the above symptoms develop, please go to the nearest emergency room or call 911. Tell them you had dry needling over your thorax and report any symptoms you are having. Please follow-up with your treating therapist after you complete the medical evaluation.  Garen Lah, PT, ATRIC Certified Exercise Expert for the Aging Adult  03/12/24 5:07 PM Phone: [phone] Fax: [phone]

## 2024-03-13 ENCOUNTER — Other Ambulatory Visit (HOSPITAL_COMMUNITY): Payer: Self-pay

## 2024-03-13 DIAGNOSIS — E118 Type 2 diabetes mellitus with unspecified complications: Secondary | ICD-10-CM | POA: Diagnosis not present

## 2024-03-13 DIAGNOSIS — F1120 Opioid dependence, uncomplicated: Secondary | ICD-10-CM | POA: Diagnosis not present

## 2024-03-13 DIAGNOSIS — E6609 Other obesity due to excess calories: Secondary | ICD-10-CM | POA: Diagnosis not present

## 2024-03-13 DIAGNOSIS — E559 Vitamin D deficiency, unspecified: Secondary | ICD-10-CM | POA: Diagnosis not present

## 2024-03-13 MED ORDER — ZUBSOLV 0.7-0.18 MG SL SUBL
1.0000 | SUBLINGUAL_TABLET | Freq: Every day | SUBLINGUAL | 0 refills | Status: DC
Start: 2024-03-13 — End: 2024-04-10
  Filled 2024-03-13: qty 30, 30d supply, fill #0

## 2024-03-14 ENCOUNTER — Other Ambulatory Visit (HOSPITAL_COMMUNITY): Payer: Self-pay

## 2024-03-14 DIAGNOSIS — E291 Testicular hypofunction: Secondary | ICD-10-CM | POA: Diagnosis not present

## 2024-03-14 DIAGNOSIS — Z79899 Other long term (current) drug therapy: Secondary | ICD-10-CM | POA: Diagnosis not present

## 2024-03-15 ENCOUNTER — Other Ambulatory Visit (HOSPITAL_COMMUNITY): Payer: Self-pay

## 2024-03-15 DIAGNOSIS — D538 Other specified nutritional anemias: Secondary | ICD-10-CM | POA: Diagnosis not present

## 2024-03-15 DIAGNOSIS — E039 Hypothyroidism, unspecified: Secondary | ICD-10-CM | POA: Diagnosis not present

## 2024-03-15 DIAGNOSIS — I10 Essential (primary) hypertension: Secondary | ICD-10-CM | POA: Diagnosis not present

## 2024-03-15 DIAGNOSIS — F419 Anxiety disorder, unspecified: Secondary | ICD-10-CM | POA: Diagnosis not present

## 2024-03-15 DIAGNOSIS — E782 Mixed hyperlipidemia: Secondary | ICD-10-CM | POA: Diagnosis not present

## 2024-03-15 DIAGNOSIS — R0602 Shortness of breath: Secondary | ICD-10-CM | POA: Diagnosis not present

## 2024-03-15 DIAGNOSIS — E538 Deficiency of other specified B group vitamins: Secondary | ICD-10-CM | POA: Diagnosis not present

## 2024-03-15 DIAGNOSIS — G609 Hereditary and idiopathic neuropathy, unspecified: Secondary | ICD-10-CM | POA: Diagnosis not present

## 2024-03-15 DIAGNOSIS — E1165 Type 2 diabetes mellitus with hyperglycemia: Secondary | ICD-10-CM | POA: Diagnosis not present

## 2024-03-15 DIAGNOSIS — E291 Testicular hypofunction: Secondary | ICD-10-CM | POA: Diagnosis not present

## 2024-03-25 ENCOUNTER — Other Ambulatory Visit: Payer: Self-pay

## 2024-03-25 ENCOUNTER — Other Ambulatory Visit (HOSPITAL_COMMUNITY): Payer: Self-pay

## 2024-03-26 ENCOUNTER — Other Ambulatory Visit (HOSPITAL_COMMUNITY): Payer: Self-pay

## 2024-03-28 ENCOUNTER — Ambulatory Visit: Attending: Orthopaedic Surgery | Admitting: Physical Therapy

## 2024-03-28 ENCOUNTER — Encounter: Payer: Self-pay | Admitting: Physical Therapy

## 2024-03-28 ENCOUNTER — Other Ambulatory Visit (HOSPITAL_COMMUNITY): Payer: Self-pay

## 2024-03-28 DIAGNOSIS — R2689 Other abnormalities of gait and mobility: Secondary | ICD-10-CM | POA: Diagnosis not present

## 2024-03-28 DIAGNOSIS — R293 Abnormal posture: Secondary | ICD-10-CM | POA: Insufficient documentation

## 2024-03-28 DIAGNOSIS — M542 Cervicalgia: Secondary | ICD-10-CM | POA: Insufficient documentation

## 2024-03-28 DIAGNOSIS — M546 Pain in thoracic spine: Secondary | ICD-10-CM | POA: Insufficient documentation

## 2024-03-28 DIAGNOSIS — G8929 Other chronic pain: Secondary | ICD-10-CM | POA: Diagnosis not present

## 2024-03-28 DIAGNOSIS — M5441 Lumbago with sciatica, right side: Secondary | ICD-10-CM | POA: Diagnosis not present

## 2024-03-28 MED ORDER — METOPROLOL SUCCINATE ER 100 MG PO TB24
100.0000 mg | ORAL_TABLET | Freq: Every day | ORAL | 0 refills | Status: DC
  Filled 2024-03-28: qty 90, 90d supply, fill #0

## 2024-03-28 NOTE — Therapy (Signed)
 OUTPATIENT PHYSICAL THERAPY LOWER EXTREMITY TREATMENT   Patient Name: Brandon Gordon MRN: [id number] DOB:May 17, 1971, 53 y.o., male Today's Date: 03/28/2024  END OF SESSION:  PT End of Session - 03/28/24 1459     Visit Number 2    Number of Visits 9    Date for PT Re-Evaluation 05/07/24    Authorization Type [place] [employer] Save    PT Start Time 1500    PT Stop Time 1545    PT Time Calculation (min) 45 min    Activity Tolerance Patient limited by pain    Behavior During Therapy [facility] for tasks assessed/performed              Past Medical History:  Diagnosis Date   Anxiety    At risk for sleep apnea    STOP-BANG= 5      SENT TO PCP 04-05-2016   Chronic pain syndrome    History of kidney stones    Hyperlipidemia    Hypertension    Left ureteral stone    OA (osteoarthritis)    back, hip   Pain management    Type 2 diabetes mellitus (HCC)    Past Surgical History:  Procedure Laterality Date   TOE SURGERY Left    left 5th toe bone excision   TOTAL HIP ARTHROPLASTY Right 04/22/2011   TOTAL HIP ARTHROPLASTY Left 12/08/2023   Procedure: LEFT TOTAL HIP ARTHROPLASTY ANTERIOR APPROACH;  Surgeon: Kathryne Hitch, MD;  Location: [facility];  Service: Orthopedics;  Laterality: Left;   Patient Active Problem List   Diagnosis Date Noted   Status post total replacement of left hip 12/08/2023    PCP: Noberto Retort, MD   REFERRING PROVIDER: Kathryne Hitch, MD   REFERRING DIAG: [phone] (ICD-10-CM) - Status post total replacement of left hip   THERAPY DIAG:  Cervicalgia  Chronic bilateral low back pain with right-sided sciatica  Pain in thoracic spine  Abnormal posture  Other abnormalities of gait and mobility  Rationale for Evaluation and Treatment: Rehabilitation  ONSET DATE: Left hip THA surgery 12-07-24  SUBJECTIVE:   SUBJECTIVE STATEMENT: I am concerned about my weight gain. I joined the [employer] [place]. I was able to Tall plank for 35 min  watching a show. I am spasm ing on the Left side and when I turn neck.    EVAL-My Left THA  anterior approach on 12-07-24 by Dr Allie Bossier.  I am now walking without AD. I got rid of my cane after 4 days.  I tried to do it all myself after I had PT at the hospital. I have not worked out as much as I have before I had hip surgery.  I have scoliosis.  I have a lot of neck pain 7-8/10 down to thoracic and down toward my right hip since surgery and I  have re aligned in a way, I often pop my neck sporadically.  I have gained 10 lb since surgery as well. Daily headaches sometimes 2 a day. Difficulty sleeping and cannot sleep on left side  PERTINENT HISTORY: Chronic pain syndrome, hyperlipidemia, HTN, OA DM Type 2, Pain managament. THR R 12 years ago May 2012.  Left shoulder hypermobility and chronic dislocation since basketball days. Surgery of 5th toe infection March 2003. Anxiety, THA Left 12-07-24. Boxer break 5th digit R hand PAIN:  Are you having pain? Yes: NPRS scale: Left hip 0/10, at worst 2/10, neck at rest 6.10 at worst 8/10,  thoracic pain/back at rest  5/10 at worst 9/10 Pain location: Right neck and right thoracic and bil low back radiating into R hip Pain description: aching and sharp Aggravating factors: sitting longer than 1 hour, standing for 10 min, sleeping, ; Relieving factors: nothing  PRECAUTIONS: Anterior hip  RED FLAGS: None   WEIGHT BEARING RESTRICTIONS: Yes WBAT L  FALLS:  Has patient fallen in last 6 months? Yes. 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 Pain due to new realignment post surgery  NEXT MD VISIT: TBD  OBJECTIVE:  Note: Objective measures were completed at Evaluation unless otherwise noted.  DIAGNOSTIC FINDINGS: See medical record  PATIENT SURVEYS:  LEFS 47/80 58.8%   NDI  25.50  50% COGNITION: Overall cognitive status: Within functional limits for tasks assessed     SENSATION: Has some numbness in bil hands and feet pt attributes to anemia  sometimes tingling on top of feet and I sometimes have fasciculations    MUSCLE LENGTH: :Thomas test Right WNL able to flatten deg; Left -15  from horizontal deg   POSTURE: Leans to the left and rotated to the left  Rounded shoulders, forward head, and scoliosis with R elevated pelvis and scoliotic curve leaning to the right in sitting and standing with flexed and  rotated to Left posture  PALPATION: Tightened Left Quadratus Lumborum, bil hamstring tightness  CERVICAL ROM:   Active ROM A/PROM (deg) eval  Flexion 32  Extension 62  Right lateral flexion 12  Left lateral flexion 24  Right rotation 40  Left rotation 62   (Blank rows = not tested)  LOWER EXTREMITY ROM:  Active ROM Right eval Left eval  Hip flexion 110 105  Hip extension    Hip abduction    Hip adduction    Hip internal rotation    Hip external rotation    Knee flexion    Knee extension    Ankle dorsiflexion    Ankle plantarflexion    Ankle inversion    Ankle eversion     (Blank rows = not tested)  LOWER EXTREMITY MMT:  MMT Right eval Left eval  Hip flexion 5 4  Hip extension 5 4  Hip abduction 5 3+  Hip adduction    Hip internal rotation    Hip external rotation    Knee flexion    Knee extension 5 5  Ankle dorsiflexion 5 5  Ankle plantarflexion    Ankle inversion    Ankle eversion     (Blank rows = not tested)  LOWER EXTREMITY SPECIAL  TESTS:    FUNCTIONAL TESTS:  2 minute walk test: 339.5  GAIT: Distance walked: 150 Assistive device utilized: None Level of assistance: Complete Independence Comments: Walks with        [facility] Adult PT Treatment:  DATE: 03-28-24 Therapeutic Exercise: Prone Lower Trapezius Strengthening on Swiss Ball  bil x 10 Prone Middle Trapezius Strengthening on Swiss Ball with DB bil x10 Prone Shoulder Extension on Swiss Ball with DB  bil 1 x 10 Prone Lower Trapezius Strengthening on Swiss Ball 3 lb DB bil 2 x 10 Prone Middle Trapezius Strengthening on [employer] with DB bil and 3lb 2 x 10 Prone Shoulder Extension on [employer] with DB with 3 # 2 x 10 [place] with BTB Manual Therapy: STW- Left UT/LS T-4 to T-8 thoracic paraspinals and Rib block T5/T-6 Trigger Point Dry Needling  Subsequent Treatment: Instructions provided previously at initial dry needling treatment.  Instructions reviewed, if requested by the patient, prior to subsequent dry needling treatment.   Patient Verbal Consent Given: Yes Education Handout Provided: Previously Provided Muscles Treated: Left UT/LS T-4 to T-8 thoracic paraspinals and Rib block T5/T-6 Electrical Stimulation Performed: No Treatment Response/Outcome: twitch response and mx tension realease                                                                                                                          TREATMENT DATE: eval and TPDN Trigger Point Dry Needling  Initial Treatment: Pt instructed on Dry Needling rational, procedures, and possible side effects. Pt instructed to expect mild to moderate muscle soreness later in the day and/or into the next day.  Pt instructed in methods to reduce muscle soreness. Pt instructed to continue prescribed HEP. Because Dry Needling was performed over or adjacent to a lung field, pt was educated on S/S of pneumothorax and to seek immediate medical attention  should they occur.  Patient was educated on signs and symptoms of infection and other risk factors and advised to seek medical attention should they occur.  Patient verbalized understanding of these instructions and education.   Patient Verbal Consent Given: Yes Education Handout Provided: Yes Muscles Treated: Right UT and LS and rhomboids Electrical Stimulation Performed: No Treatment Response/Outcome: muscle twitch and release of muscle tension and pain      PATIENT EDUCATION:  Education details: POC Explanation of findings Person educated: Patient Education method: Explanation, Demonstration, and Handouts Education comprehension: verbalized understanding and needs further education  HOME EXERCISE PROGRAM: Access Code: [email or web address] URL: [email or web address] Date: 03/28/2024 Prepared by: Garen Lah  Exercises - Prone Lower Trapezius Strengthening on Swiss Ball with Dumbbells  - 1 x daily - 7 x weekly - 3 sets - 10 reps - Prone Middle Trapezius Strengthening on Swiss Ball with Dumbbells  - 1 x daily - 7 x weekly - 3 sets - 10 reps - Prone Shoulder Extension on Swiss Ball with Dumbbells  - 1 x daily - 7 x weekly - 3 sets - 10 reps  ASSESSMENT:  CLINICAL IMPRESSION: Pt enters clinic with left neck spasm and subscapular weakness. Pt dominates home routine with anterior chain exercise with heavy weights but would benefit from posterior chain strengthening.  Pt given HEP with Prone  on physio ball subscapular stabilizers for home use.  Continue next visit with DGI and endurance exercises and DNF exercises.   EVAL-Patient is a 53 y.o. male who was seen today for physical therapy evaluation and treatment for S/P L THA on 12-07-24 by Dr Magnus Ivan. Pt also has subsequent neck pain on Right and Right upper back/low back due to new realignment  post surgery and subsequent  compensations. and needs skilled PT to address impairments limiting  AROM in cervical area and R sided  thoracic and back pain  OBJECTIVE IMPAIRMENTS: decreased activity tolerance, decreased knowledge of condition, decreased mobility, difficulty walking, decreased ROM, decreased strength, increased fascial restrictions, improper body mechanics, postural dysfunction, and pain.   ACTIVITY LIMITATIONS: carrying, lifting, bending, sitting, standing, squatting, sleeping, stairs, locomotion level, and caring for others  PARTICIPATION LIMITATIONS: driving, community activity, yard work, church, and caring for special needs son  PERSONAL FACTORS: Chronic pain syndrome, hyperlipidemia, HTN, OA DM Type 2, Pain managament. THR R 12 years ago May 2012.  Left shoulder hypermobility and chronic dislocation since basketball days. Surgery of 5th toe infection March 2003. Anxiety, THA Left 12-07-24. Boxer break 5th digit R hand and scoliosis moderate are also affecting patient's functional outcome.   REHAB POTENTIAL: Good  CLINICAL DECISION MAKING: Evolving/moderate complexity  EVALUATION COMPLEXITY: Moderate   GOALS: Goals reviewed with patient? Yes  SHORT TERM GOALS: Target date: Apr 18, 2024  Pt will be independent with initial HEP Baseline:no knowledge Goal status: INITIAL  2.    Demonstrate understanding of  as neutral posture as possible with scoliosis and be more conscious of position and posture throughout the day.  Baseline: needs reinforcement Goal status: INITIAL  3.  Pt will be educated on going up and down steps forward motion without exacerbating pain Baseline: Pt descends backwards due to large feet and pain in left hip now alleviated by surgery Goal status: INITIAL  4.  Pt will perform DGI for balance assessment Baseline: Pt has fallen due to anemia but needs further balance assessment Goal status: INITIAL    LONG TERM GOALS: Target date: 05-21-24  Pt will be able to perform advanced HEP using gym  and lifting  Baseline: Pt post surgery since 12-07-24 but still not maximized in  strength Goal status: INITIAL  2.  Pt will improve neck AROM in left lateral flexion by at least 10 degrees and improve pain by 50% in order to scan environment with greater ease Baseline:  Goal status: INITIAL  3.  Pt will be able to walk a mile without cramping and pain in R LE Baseline: Pt not able to walk for more than 2 minutes at eval Goal status: INITIAL  4.  Pt will be able to sleep 4 or more hours of restorative sleep and be able to sleep on left side Baseline: only sleeps on Right side. Goal status: INITIAL  5.  Pt will improve LEFS to at least 60/80 to show improved functional mobility Baseline: Eval 47/80 58.8% Goal status: INITIAL  6.  Pt will improved NDI to at least 36/50 to show improved neck and UE ability to perform daily duties. Baseline: Eval 25/50  50% Goal status: INITIAL    7. Pt will be able to perform 6 MWT to within normal limites   Baseline, 2 MWT339.5 ft   Goal status INITIAL   PLAN:  PT FREQUENCY: 1-2x/week  PT DURATION: 10 weeks  PLANNED INTERVENTIONS: 97164- PT Re-evaluation, 97110-Therapeutic exercises, 97530- Therapeutic activity, 97112- Neuromuscular re-education,  60454- Self Care, 09811- Manual therapy, [phone]- Gait training, [phone]- Electrical stimulation (manual), Patient/Family education, Balance training, Stair training, Taping, Dry Needling, Joint mobilization, Spinal mobilization, Cryotherapy, and Moist heat  PLAN FOR NEXT SESSION: TPDN, Manual, issue HEP, gait training Do DGI  Do DNF training   Garen Lah, PT, ATRIC Certified Exercise Expert for the Aging Adult  03/28/24 4:01 PM Phone: [phone] Fax: [phone]

## 2024-04-03 ENCOUNTER — Other Ambulatory Visit (HOSPITAL_COMMUNITY): Payer: Self-pay

## 2024-04-03 NOTE — Therapy (Incomplete)
 OUTPATIENT PHYSICAL THERAPY LOWER EXTREMITY TREATMENT   Patient Name: Brandon Gordon MRN: [id number] DOB:July 18, 1971, 53 y.o., male Today's Date: 04/03/2024  END OF SESSION:     Past Medical History:  Diagnosis Date   Anxiety    At risk for sleep apnea    STOP-BANG= 5      SENT TO PCP 04-05-2016   Chronic pain syndrome    History of kidney stones    Hyperlipidemia    Hypertension    Left ureteral stone    OA (osteoarthritis)    back, hip   Pain management    Type 2 diabetes mellitus (HCC)    Past Surgical History:  Procedure Laterality Date   TOE SURGERY Left    left 5th toe bone excision   TOTAL HIP ARTHROPLASTY Right 04/22/2011   TOTAL HIP ARTHROPLASTY Left 12/08/2023   Procedure: LEFT TOTAL HIP ARTHROPLASTY ANTERIOR APPROACH;  Surgeon: Brandon Lao, MD;  Location: [facility];  Service: Orthopedics;  Laterality: Left;   Patient Active Problem List   Diagnosis Date Noted   Status post total replacement of left hip 12/08/2023    PCP: Brandon Congo, MD   REFERRING PROVIDER: Arnie Lao, MD   REFERRING DIAG: [phone] (ICD-10-CM) - Status post total replacement of left hip   THERAPY DIAG:  No diagnosis found.  Rationale for Evaluation and Treatment: Rehabilitation  ONSET DATE: Left hip THA surgery 12-07-24  SUBJECTIVE:   SUBJECTIVE STATEMENT: I am concerned about my weight gain. I joined the [employer] [place]. I was able to Tall plank for 35 min watching a show. I am spasm ing on the Left side and when I turn neck.    EVAL-My Left THA  anterior approach on 12-07-24 by Dr Brandon Gordon.  I am now walking without AD. I got rid of my cane after 4 days.  I tried to do it all myself after I had PT at the hospital. I have not worked out as much as I have before I had hip surgery.  I have scoliosis.  I have a lot of neck pain 7-8/10 down to thoracic and down toward my right hip since surgery and I  have re aligned in a way, I often pop my neck  sporadically.  I have gained 10 lb since surgery as well. Daily headaches sometimes 2 a day. Difficulty sleeping and cannot sleep on left side  PERTINENT HISTORY: Chronic pain syndrome, hyperlipidemia, HTN, OA DM Type 2, Pain managament. THR R 12 years ago May 2012.  Left shoulder hypermobility and chronic dislocation since basketball days. Surgery of 5th toe infection March 2003. Anxiety, THA Left 12-07-24. Boxer break 5th digit R hand PAIN:  Are you having pain? Yes: NPRS scale: Left hip 0/10, at worst 2/10, neck at rest 6.10 at worst 8/10,  thoracic pain/back at rest 5/10 at worst 9/10 Pain location: Right neck and right thoracic and bil low back radiating into R hip Pain description: aching and sharp Aggravating factors: sitting longer than 1 hour, standing for 10 min, sleeping, ; Relieving factors: nothing  PRECAUTIONS: Anterior hip  RED FLAGS: None   WEIGHT BEARING RESTRICTIONS: Yes WBAT L  FALLS:  Has patient fallen in last 6 months? Yes. 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 Pain due to new realignment post surgery  NEXT MD VISIT: TBD  OBJECTIVE:  Note: Objective measures were completed at Evaluation unless otherwise noted.  DIAGNOSTIC FINDINGS: See medical record  PATIENT SURVEYS:  LEFS 47/80 58.8%   NDI  25.50  50% COGNITION: Overall cognitive status: Within functional limits for tasks assessed     SENSATION: Has some  numbness in bil hands and feet pt attributes to anemia  sometimes tingling on top of feet and I sometimes have fasciculations    MUSCLE LENGTH: :Thomas test Right WNL able to flatten deg; Left -15  from horizontal deg   POSTURE: Leans to the left and rotated to the left  Rounded shoulders, forward head, and scoliosis with R elevated pelvis and scoliotic curve leaning to the right in sitting and standing with flexed and  rotated to Left posture  PALPATION: Tightened Left Quadratus Lumborum, bil hamstring tightness  CERVICAL ROM:   Active ROM A/PROM (deg) eval  Flexion 32  Extension 62  Right lateral flexion 12  Left lateral flexion 24  Right rotation 40  Left rotation 62   (Blank rows = not tested)  LOWER EXTREMITY ROM:  Active ROM Right eval Left eval  Hip flexion 110 105  Hip extension    Hip abduction    Hip adduction    Hip internal rotation    Hip external rotation    Knee flexion    Knee extension    Ankle dorsiflexion    Ankle plantarflexion    Ankle inversion    Ankle eversion     (Blank rows = not tested)  LOWER EXTREMITY MMT:  MMT Right eval Left eval  Hip flexion 5 4  Hip extension 5 4  Hip abduction 5 3+  Hip adduction    Hip internal rotation    Hip external rotation    Knee flexion    Knee extension 5 5  Ankle dorsiflexion 5 5  Ankle plantarflexion    Ankle inversion    Ankle eversion     (Blank rows = not tested)  LOWER EXTREMITY SPECIAL TESTS:    FUNCTIONAL TESTS:  2 minute walk test: 339.5  GAIT: Distance walked: 150 Assistive device utilized: None Level of assistance: Complete Independence Comments: Walks with   [facility] Adult PT Treatment:                                                DATE: 04-04-24 Therapeutic Exercise: *** Manual Therapy: *** Neuromuscular re-ed: *** Therapeutic Activity: *** Modalities: *** Self Care: ***      [name] Adult PT Treatment:                                                DATE:  03-28-24 Therapeutic Exercise: Prone Lower Trapezius Strengthening on Swiss Ball  bil x 10 Prone Middle Trapezius Strengthening on Swiss Ball with DB bil x10 Prone  Shoulder Extension on [employer] with DB  bil 1 x 10 Prone Lower Trapezius Strengthening on Swiss Ball 3 lb DB bil 2 x 10 Prone Middle Trapezius Strengthening on [employer] with DB bil and 3lb 2 x 10 Prone Shoulder Extension on [employer] with DB with 3 # 2 x 10 [place] with BTB Manual Therapy: STW- Left UT/LS T-4 to T-8 thoracic paraspinals and Rib block T5/T-6 Trigger Point Dry Needling  Subsequent Treatment: Instructions provided previously at initial dry needling treatment.  Instructions reviewed, if requested by the patient, prior to subsequent dry needling treatment.   Patient Verbal Consent Given: Yes Education Handout Provided: Previously Provided Muscles Treated: Left UT/LS T-4 to T-8 thoracic paraspinals and Rib block T5/T-6 Electrical Stimulation Performed: No Treatment Response/Outcome: twitch response and mx tension realease                                                                                                                          TREATMENT DATE: eval and TPDN Trigger Point Dry Needling  Initial Treatment: Pt instructed on Dry Needling rational, procedures, and possible side effects. Pt instructed to expect mild to moderate muscle soreness later in the day and/or into the next day.  Pt instructed in methods to reduce muscle soreness. Pt instructed to continue prescribed HEP. Because Dry Needling was performed over or adjacent to a lung field, pt was educated on S/S of pneumothorax and to seek immediate medical attention should they occur.  Patient was educated on signs and symptoms of infection and other risk factors and advised to seek medical attention should they occur.  Patient verbalized understanding of these instructions and education.   Patient Verbal Consent Given: Yes Education  Handout Provided: Yes Muscles Treated: Right UT and LS and rhomboids Electrical Stimulation Performed: No Treatment Response/Outcome: muscle twitch and release of muscle tension and pain      PATIENT EDUCATION:  Education details: POC Explanation of findings Person educated: Patient Education method: Explanation, Demonstration, and Handouts Education comprehension: verbalized understanding and needs further education  HOME EXERCISE PROGRAM: Access Code: [email or web address] URL: [email or web address] Date: 03/28/2024 Prepared by: Sharlet Dawson  Exercises - Prone Lower Trapezius Strengthening on Swiss Ball with Dumbbells  - 1 x daily - 7 x weekly - 3 sets - 10 reps - Prone Middle Trapezius Strengthening on Swiss Ball with Dumbbells  - 1 x daily - 7 x weekly - 3 sets - 10 reps - Prone Shoulder Extension on Swiss Ball with Dumbbells  - 1 x daily - 7 x weekly - 3 sets - 10 reps  ASSESSMENT:  CLINICAL IMPRESSION: Pt enters clinic with left neck spasm and subscapular weakness. Pt dominates home routine with anterior chain exercise with heavy weights but would benefit from posterior chain strengthening.  Pt given HEP with Prone on physio ball subscapular stabilizers for home use.  Continue next visit with DGI and endurance exercises and DNF exercises.   EVAL-Patient is a 53 y.o.  male who was seen today for physical therapy evaluation and treatment for S/P L THA on 12-07-24 by Dr Magnus Ivan. Pt also has subsequent neck pain on Right and Right upper back/low back due to new realignment  post surgery and subsequent  compensations. and needs skilled PT to address impairments limiting  AROM in cervical area and R sided thoracic and back pain  OBJECTIVE IMPAIRMENTS: decreased activity tolerance, decreased knowledge of condition, decreased mobility, difficulty walking, decreased ROM, decreased strength, increased fascial restrictions, improper body mechanics, postural dysfunction, and pain.    ACTIVITY LIMITATIONS: carrying, lifting, bending, sitting, standing, squatting, sleeping, stairs, locomotion level, and caring for others  PARTICIPATION LIMITATIONS: driving, community activity, yard work, church, and caring for special needs son  PERSONAL FACTORS: Chronic pain syndrome, hyperlipidemia, HTN, OA DM Type 2, Pain managament. THR R 12 years ago May 2012.  Left shoulder hypermobility and chronic dislocation since basketball days. Surgery of 5th toe infection March 2003. Anxiety, THA Left 12-07-24. Boxer break 5th digit R hand and scoliosis moderate are also affecting patient's functional outcome.   REHAB POTENTIAL: Good  CLINICAL DECISION MAKING: Evolving/moderate complexity  EVALUATION COMPLEXITY: Moderate   GOALS: Goals reviewed with patient? Yes  SHORT TERM GOALS: Target date: Apr 18, 2024  Pt will be independent with initial HEP Baseline:no knowledge Goal status: INITIAL  2.    Demonstrate understanding of  as neutral posture as possible with scoliosis and be more conscious of position and posture throughout the day.  Baseline: needs reinforcement Goal status: INITIAL  3.  Pt will be educated on going up and down steps forward motion without exacerbating pain Baseline: Pt descends backwards due to large feet and pain in left hip now alleviated by surgery Goal status: INITIAL  4.  Pt will perform DGI for balance assessment Baseline: Pt has fallen due to anemia but needs further balance assessment Goal status: INITIAL    LONG TERM GOALS: Target date: 05-21-24  Pt will be able to perform advanced HEP using gym  and lifting  Baseline: Pt post surgery since 12-07-24 but still not maximized in strength Goal status: INITIAL  2.  Pt will improve neck AROM in left lateral flexion by at least 10 degrees and improve pain by 50% in order to scan environment with greater ease Baseline:  Goal status: INITIAL  3.  Pt will be able to walk a mile without cramping and  pain in R LE Baseline: Pt not able to walk for more than 2 minutes at eval Goal status: INITIAL  4.  Pt will be able to sleep 4 or more hours of restorative sleep and be able to sleep on left side Baseline: only sleeps on Right side. Goal status: INITIAL  5.  Pt will improve LEFS to at least 60/80 to show improved functional mobility Baseline: Eval 47/80 58.8% Goal status: INITIAL  6.  Pt will improved NDI to at least 36/50 to show improved neck and UE ability to perform daily duties. Baseline: Eval 25/50  50% Goal status: INITIAL    7. Pt will be able to perform 6 MWT to within normal limites   Baseline, 2 MWT339.5 ft   Goal status INITIAL   PLAN:  PT FREQUENCY: 1-2x/week  PT DURATION: 10 weeks  PLANNED INTERVENTIONS: 97164- PT Re-evaluation, 97110-Therapeutic exercises, 97530- Therapeutic activity, 97112- Neuromuscular re-education, 97535- Self Care, 29562- Manual therapy, [phone]- Gait training, [phone]- Electrical stimulation (manual), Patient/Family education, Balance training, Stair training, Taping, Dry Needling, Joint mobilization, Spinal mobilization, Cryotherapy,  and Moist heat  PLAN FOR NEXT SESSION: TPDN, Manual, issue HEP, gait training Do DGI  Do DNF training   ***

## 2024-04-04 ENCOUNTER — Ambulatory Visit: Admitting: Physical Therapy

## 2024-04-04 ENCOUNTER — Encounter (HOSPITAL_COMMUNITY): Payer: Self-pay

## 2024-04-04 ENCOUNTER — Other Ambulatory Visit: Payer: Self-pay

## 2024-04-04 ENCOUNTER — Other Ambulatory Visit (HOSPITAL_COMMUNITY): Payer: Self-pay

## 2024-04-04 DIAGNOSIS — R0602 Shortness of breath: Secondary | ICD-10-CM | POA: Diagnosis not present

## 2024-04-04 MED ORDER — ATORVASTATIN CALCIUM 40 MG PO TABS
40.0000 mg | ORAL_TABLET | Freq: Every day | ORAL | 0 refills | Status: DC
Start: 2024-04-04 — End: 2024-07-03
  Filled 2024-04-04: qty 90, 90d supply, fill #0

## 2024-04-06 ENCOUNTER — Other Ambulatory Visit (HOSPITAL_COMMUNITY): Payer: Self-pay

## 2024-04-08 ENCOUNTER — Encounter (HOSPITAL_COMMUNITY): Payer: Self-pay

## 2024-04-08 ENCOUNTER — Other Ambulatory Visit (HOSPITAL_COMMUNITY): Payer: Self-pay

## 2024-04-09 ENCOUNTER — Ambulatory Visit: Payer: Self-pay | Admitting: Physical Therapy

## 2024-04-10 ENCOUNTER — Other Ambulatory Visit (HOSPITAL_COMMUNITY): Payer: Self-pay

## 2024-04-10 DIAGNOSIS — F1120 Opioid dependence, uncomplicated: Secondary | ICD-10-CM | POA: Diagnosis not present

## 2024-04-10 MED ORDER — AZELASTINE HCL 0.1 % NA SOLN
1.0000 | Freq: Two times a day (BID) | NASAL | 0 refills | Status: AC
Start: 2024-04-10 — End: ?
  Filled 2024-04-10: qty 30, 30d supply, fill #0

## 2024-04-10 MED ORDER — ZUBSOLV 0.7-0.18 MG SL SUBL
1.0000 | SUBLINGUAL_TABLET | Freq: Every day | SUBLINGUAL | 0 refills | Status: DC
  Filled 2024-04-10 – 2024-04-12 (×3): qty 30, 30d supply, fill #0

## 2024-04-10 NOTE — Therapy (Signed)
 OUTPATIENT PHYSICAL THERAPY LOWER EXTREMITY TREATMENT   Patient Name: Brandon Gordon MRN: [id number] DOB:27-Aug-1971, 53 y.o., male Today's Date: 04/11/2024  END OF SESSION:  PT End of Session - 04/11/24 0938     Visit Number 3    Number of Visits 9    Date for PT Re-Evaluation 05/07/24    Authorization Type [employer]    PT Start Time [phone]    PT Stop Time 1015    PT Time Calculation (min) 41 min    Activity Tolerance Patient limited by pain    Behavior During Therapy [facility] for tasks assessed/performed               Past Medical History:  Diagnosis Date   Anxiety    At risk for sleep apnea    STOP-BANG= 5      SENT TO PCP 04-05-2016   Chronic pain syndrome    History of kidney stones    Hyperlipidemia    Hypertension    Left ureteral stone    OA (osteoarthritis)    back, hip   Pain management    Type 2 diabetes mellitus (HCC)    Past Surgical History:  Procedure Laterality Date   TOE SURGERY Left    left 5th toe bone excision   TOTAL HIP ARTHROPLASTY Right 04/22/2011   TOTAL HIP ARTHROPLASTY Left 12/08/2023   Procedure: LEFT TOTAL HIP ARTHROPLASTY ANTERIOR APPROACH;  Surgeon: Arnie Lao, MD;  Location: [facility];  Service: Orthopedics;  Laterality: Left;   Patient Active Problem List   Diagnosis Date Noted   Status post total replacement of left hip 12/08/2023    PCP: Roselind Congo, MD   REFERRING PROVIDER: Arnie Lao, MD   REFERRING DIAG: [id number] (ICD-10-CM) - Status post total replacement of left hip   THERAPY DIAG:  Cervicalgia  Chronic bilateral low back pain with right-sided sciatica  Pain in thoracic spine  Other abnormalities of gait and mobility  Abnormal posture  Rationale for Evaluation and Treatment: Rehabilitation  ONSET DATE: Left hip THA surgery 12-07-24  SUBJECTIVE:   SUBJECTIVE STATEMENT: 04/11/2024 I am still having cold in my hands and we have anemia maybe.  We are trying to work on a  work up for why I am still anemic. My neck has been killing me  I am a 7/10 pain in my left neck      EVAL-My Left THA  anterior approach on 12-07-24 by Dr Albina Hull.  I am now walking without AD. I got rid of my cane after 4 days.  I tried to do it all myself after I had PT at the hospital. I have not worked out as much as I have before I had hip surgery.  I have scoliosis.  I have a lot of neck pain 7-8/10 down to thoracic and down toward my right hip since surgery and I  have re aligned in a way, I often pop my neck sporadically.  I have gained 10 lb since surgery as well. Daily headaches sometimes 2 a day. Difficulty sleeping and cannot sleep on left side  PERTINENT HISTORY: Chronic pain syndrome, hyperlipidemia, HTN, OA DM Type 2, Pain managament. THR R 12 years ago May 2012.  Left shoulder hypermobility and chronic dislocation since basketball days. Surgery of 5th toe infection March 2003. Anxiety, THA Left 12-07-24. Boxer break 5th digit R hand PAIN:  Are you having pain? Yes: NPRS scale: Left hip 0/10, at worst 2/10,  neck at rest 6.10 at worst 8/10,  thoracic pain/back at rest 5/10 at worst 9/10 Pain location: Right neck and right thoracic and bil low back radiating into R hip Pain description: aching and sharp Aggravating factors: sitting longer than 1 hour, standing for 10 min, sleeping, ; Relieving factors: nothing  PRECAUTIONS: Anterior hip  RED FLAGS: None   WEIGHT BEARING RESTRICTIONS: Yes WBAT L  FALLS:  Has patient fallen in last 6 months? Yes. 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 Pain due to new realignment post surgery  NEXT MD VISIT: TBD  OBJECTIVE:  Note: Objective measures were completed at Evaluation unless otherwise noted.  DIAGNOSTIC FINDINGS: See medical record  PATIENT SURVEYS:  LEFS 47/80 58.8%   NDI  25.50  50% COGNITION: Overall cognitive status: Within functional limits for tasks assessed     SENSATION: Has some numbness in bil hands and feet pt attributes to anemia  sometimes tingling on top of feet and I sometimes have fasciculations    MUSCLE LENGTH: :Thomas test Right WNL able to flatten deg; Left -15  from horizontal deg   POSTURE: Leans to the left and rotated to the left  Rounded shoulders, forward head, and scoliosis with R elevated pelvis and scoliotic curve leaning to the right in sitting and standing with flexed and  rotated to Left posture  PALPATION: Tightened Left Quadratus Lumborum, bil hamstring tightness  CERVICAL ROM:   Active ROM A/PROM (deg) eval  Flexion 32  Extension 62  Right lateral flexion 12  Left lateral flexion 24  Right rotation 40  Left rotation 62   (Blank rows = not tested)  LOWER EXTREMITY ROM:  Active ROM Right eval Left eval  Hip flexion 110 105  Hip extension    Hip abduction    Hip adduction    Hip internal rotation    Hip external rotation    Knee flexion    Knee extension    Ankle dorsiflexion    Ankle plantarflexion    Ankle inversion    Ankle eversion     (Blank rows = not tested)  LOWER EXTREMITY MMT:  MMT Right eval Left eval  Hip flexion 5 4  Hip extension 5 4  Hip abduction 5 3+  Hip adduction    Hip internal rotation    Hip external rotation    Knee flexion    Knee extension 5 5  Ankle dorsiflexion 5 5  Ankle plantarflexion    Ankle inversion    Ankle eversion     (Blank rows = not  tested)  LOWER EXTREMITY SPECIAL TESTS:    FUNCTIONAL TESTS:  2 minute walk test: 339.5  GAIT: Distance walked: 150 Assistive device utilized: None Level of assistance: Complete Independence Comments: Walks with    TREATMENT:  [facility] Adult PT Treatment:  DATE: 04/11/24 Therapeutic Exercise: Prone cervical extension x 15 Sidelying neck AROM with chin tuck bil 2 x 10 Push Up with Plus  2 x 10 Isometric Cervical Extension at Wall with Ball   1 x 15  Standing Isometric Cervical Extension and Bilateral Shoulder Flexion at Wall with Celeste Cola with Dumbbells 5#  Isometric Cervical Rotation at Wall with Ball1 x 15 each side Manual Therapy: STW of UT/LS  bil cervical paraspinals Gentle cervical distraction UPA on Right C-3 to C6 Trigger Point Dry Needling  Subsequent Treatment: Instructions provided previously at initial dry needling treatment.  Instructions reviewed, if requested by the patient, prior to subsequent dry needling treatment.   Patient Verbal Consent Given: Yes Education Handout Provided: Previously Provided Muscles Treated: C-3 to C-6 on Right , UT/ LS Electrical Stimulation Performed: No Treatment Response/Outcome: twitch response and muscle tension release    Self Care: Discussed importance of slow walking and getting in movement rather than " conquering"  preventing spasms    [facility] Adult PT Treatment:                                                DATE: 03-28-24 Therapeutic Exercise: Prone Lower Trapezius Strengthening on Swiss Ball  bil x 10 Prone Middle Trapezius Strengthening on Swiss Ball with DB bil x10 Prone Shoulder Extension on Swiss Ball with DB  bil 1 x 10 Prone Lower Trapezius Strengthening on Swiss Ball 3 lb DB bil 2 x 10 Prone Middle Trapezius Strengthening on [employer] with DB bil and 3lb 2 x 10 Prone Shoulder Extension on [employer] with DB with 3 # 2 x 10 [place] with BTB Manual Therapy: STW-  Left UT/LS T-4 to T-8 thoracic paraspinals and Rib block T5/T-6 Trigger Point Dry Needling  Subsequent Treatment: Instructions provided previously at initial dry needling treatment.  Instructions reviewed, if requested by the patient, prior to subsequent dry needling treatment.   Patient Verbal Consent Given: Yes Education Handout Provided: Previously Provided Muscles Treated: Left UT/LS T-4 to T-8 thoracic paraspinals and Rib block T5/T-6 Electrical Stimulation Performed: No Treatment Response/Outcome: twitch response and mx tension realease                                                                                                                          TREATMENT DATE: eval and TPDN Trigger Point Dry Needling  Initial Treatment: Pt instructed on Dry Needling rational, procedures, and possible side effects. Pt instructed to expect mild to moderate muscle soreness later in the day and/or into the next day.  Pt instructed in methods to reduce muscle soreness. Pt instructed to continue prescribed HEP. Because Dry Needling was performed over or adjacent to a lung field, pt was educated on S/S of pneumothorax and to seek immediate medical attention should they occur.  Patient was educated  on signs and symptoms of infection and other risk factors and advised to seek medical attention should they occur.  Patient verbalized understanding of these instructions and education.   Patient Verbal Consent Given: Yes Education Handout Provided: Yes Muscles Treated: Right UT and LS and rhomboids Electrical Stimulation Performed: No Treatment Response/Outcome: muscle twitch and release of muscle tension and pain      PATIENT EDUCATION:  Education details: rationale for interventions, HEP  Person educated: Patient Education method: Explanation, Demonstration, Tactile cues, Verbal cues Education comprehension: verbalized understanding, returned demonstration, verbal cues required, tactile cues  required, and needs further education     HOME EXERCISE PROGRAM: Access Code: [email or web address] URL: [email or web address] Date: 03/28/2024 Prepared by: Sharlet Dawson  Exercises - Prone Lower Trapezius Strengthening on Swiss Ball with Dumbbells  - 1 x daily - 7 x weekly - 3 sets - 10 reps - Prone Middle Trapezius Strengthening on Swiss Ball with Dumbbells  - 1 x daily - 7 x weekly - 3 sets - 10 reps - Prone Shoulder Extension on Swiss Ball with Dumbbells  - 1 x daily - 7 x weekly - 3 sets - 10 reps Added 04-11-24 Program Notes For AROM of neck lie down and do [place] and prone head lifts with chin tucks - Push Up with Plus  - 1 x daily - 7 x weekly - 2 sets - 10 reps - Isometric Cervical Extension at Wall with Ball  - 1 x daily - 7 x weekly - 3 sets - 10 reps - Standing Isometric Cervical Extension and Bilateral Shoulder Flexion at Wall with Ball with Dumbbells  - 1 x daily - 7 x weekly - 3 sets - 10 reps - Isometric Cervical Rotation at [employer] with Ball  - 1 x daily - 7 x weekly - 3 sets - 10 reps ASSESSMENT:  CLINICAL IMPRESSION: 04/11/2024 Pt reports 7/10 pain and ending with 4/10 after session  [name] enters clinic with left neck spasm and subscapular weakness with increased pain to 7/10.  Pt consents to TPDN and is closely monitored throughout session with twitch response and relief of neck spasm. Pt updated HEP with cervical and DNF exercises. Continue next visit with DGI and endurance exercises and walking program   EVAL-Patient is a 53 y.o. male who was seen today for physical therapy evaluation and treatment for S/P L THA on 12-07-24 by Dr Lucienne Ryder. Pt also has subsequent neck pain on Right and Right upper back/low back due to new realignment  post surgery and subsequent  compensations. and needs skilled PT to address impairments limiting  AROM in cervical area and R sided thoracic and back pain  OBJECTIVE IMPAIRMENTS: decreased activity tolerance, decreased knowledge of  condition, decreased mobility, difficulty walking, decreased ROM, decreased strength, increased fascial restrictions, improper body mechanics, postural dysfunction, and pain.   ACTIVITY LIMITATIONS: carrying, lifting, bending, sitting, standing, squatting, sleeping, stairs, locomotion level, and caring for others  PARTICIPATION LIMITATIONS: driving, community activity, yard work, church, and caring for special needs son  PERSONAL FACTORS: Chronic pain syndrome, hyperlipidemia, HTN, OA DM Type 2, Pain managament. THR R 12 years ago May 2012.  Left shoulder hypermobility and chronic dislocation since basketball days. Surgery of 5th toe infection March 2003. Anxiety, THA Left 12-07-24. Boxer break 5th digit R hand and scoliosis moderate are also affecting patient's functional outcome.   REHAB POTENTIAL: Good  CLINICAL DECISION MAKING: Evolving/moderate complexity  EVALUATION COMPLEXITY: Moderate   GOALS: Goals reviewed with patient? Yes  SHORT TERM  GOALS: Target date: Apr 18, 2024  Pt will be independent with initial HEP Baseline:no knowledge Goal status: INITIAL  2.    Demonstrate understanding of  as neutral posture as possible with scoliosis and be more conscious of position and posture throughout the day.  Baseline: needs reinforcement Goal status: INITIAL  3.  Pt will be educated on going up and down steps forward motion without exacerbating pain Baseline: Pt descends backwards due to large feet and pain in left hip now alleviated by surgery Goal status: INITIAL  4.  Pt will perform DGI for balance assessment Baseline: Pt has fallen due to anemia but needs further balance assessment Goal status: INITIAL    LONG TERM GOALS: Target date: 05-21-24  Pt will be able to perform advanced HEP using gym  and lifting  Baseline: Pt post surgery since 12-07-24 but still not maximized in strength Goal status: INITIAL  2.  Pt will improve neck AROM in left lateral flexion by at least 10  degrees and improve pain by 50% in order to scan environment with greater ease Baseline:  Goal status: INITIAL  3.  Pt will be able to walk a mile without cramping and pain in R LE Baseline: Pt not able to walk for more than 2 minutes at eval Goal status: INITIAL  4.  Pt will be able to sleep 4 or more hours of restorative sleep and be able to sleep on left side Baseline: only sleeps on Right side. Goal status: INITIAL  5.  Pt will improve LEFS to at least 60/80 to show improved functional mobility Baseline: Eval 47/80 58.8% Goal status: INITIAL  6.  Pt will improved NDI to at least 36/50 to show improved neck and UE ability to perform daily duties. Baseline: Eval 25/50  50% Goal status: INITIAL    7. Pt will be able to perform 6 MWT to within normal limites   Baseline, 2 MWT339.5 ft   Goal status INITIAL   PLAN:  PT FREQUENCY: 1-2x/week  PT DURATION: 10 weeks  PLANNED INTERVENTIONS: 97164- PT Re-evaluation, 97110-Therapeutic exercises, 97530- Therapeutic activity, 97112- Neuromuscular re-education, 97535- Self Care, 09811- Manual therapy, [phone]- Gait training, [phone]- Electrical stimulation (manual), Patient/Family education, Balance training, Stair training, Taping, Dry Needling, Joint mobilization, Spinal mobilization, Cryotherapy, and Moist heat  PLAN FOR NEXT SESSION: TPDN, Manual, issue HEP, gait training Do DGI  Do DNF training  Sharlet Dawson, PT, ATRIC Certified Exercise Expert for the Aging Adult  04/11/24 10:57 AM Phone: [phone] Fax: [phone]

## 2024-04-11 ENCOUNTER — Ambulatory Visit: Admitting: Physical Therapy

## 2024-04-11 ENCOUNTER — Other Ambulatory Visit (HOSPITAL_COMMUNITY): Payer: Self-pay

## 2024-04-11 ENCOUNTER — Encounter: Payer: Self-pay | Admitting: Physical Therapy

## 2024-04-11 DIAGNOSIS — G8929 Other chronic pain: Secondary | ICD-10-CM | POA: Diagnosis not present

## 2024-04-11 DIAGNOSIS — M546 Pain in thoracic spine: Secondary | ICD-10-CM

## 2024-04-11 DIAGNOSIS — R2689 Other abnormalities of gait and mobility: Secondary | ICD-10-CM

## 2024-04-11 DIAGNOSIS — R293 Abnormal posture: Secondary | ICD-10-CM | POA: Diagnosis not present

## 2024-04-11 DIAGNOSIS — M542 Cervicalgia: Secondary | ICD-10-CM

## 2024-04-11 DIAGNOSIS — M5441 Lumbago with sciatica, right side: Secondary | ICD-10-CM | POA: Diagnosis not present

## 2024-04-12 ENCOUNTER — Other Ambulatory Visit (HOSPITAL_COMMUNITY): Payer: Self-pay

## 2024-04-15 ENCOUNTER — Other Ambulatory Visit (HOSPITAL_COMMUNITY): Payer: Self-pay

## 2024-04-15 DIAGNOSIS — E291 Testicular hypofunction: Secondary | ICD-10-CM | POA: Diagnosis not present

## 2024-04-16 ENCOUNTER — Ambulatory Visit: Admitting: Physical Therapy

## 2024-04-16 ENCOUNTER — Encounter: Payer: Self-pay | Admitting: Physical Therapy

## 2024-04-16 DIAGNOSIS — M546 Pain in thoracic spine: Secondary | ICD-10-CM

## 2024-04-16 DIAGNOSIS — R293 Abnormal posture: Secondary | ICD-10-CM

## 2024-04-16 DIAGNOSIS — G8929 Other chronic pain: Secondary | ICD-10-CM | POA: Diagnosis not present

## 2024-04-16 DIAGNOSIS — R2689 Other abnormalities of gait and mobility: Secondary | ICD-10-CM

## 2024-04-16 DIAGNOSIS — M542 Cervicalgia: Secondary | ICD-10-CM

## 2024-04-16 DIAGNOSIS — M5441 Lumbago with sciatica, right side: Secondary | ICD-10-CM | POA: Diagnosis not present

## 2024-04-16 NOTE — Therapy (Signed)
 OUTPATIENT PHYSICAL THERAPY LOWER EXTREMITY TREATMENT   Patient Name: Brandon Gordon MRN: [id number] DOB:08-Jul-1971, 53 y.o., male Today's Date: 04/16/2024  END OF SESSION:  PT End of Session - 04/16/24 1542     Visit Number 4    Number of Visits 9    Date for PT Re-Evaluation 05/07/24    Authorization Type [facility] [employer]    PT Start Time [phone]    Activity Tolerance Patient limited by pain    Behavior During Therapy [facility] for tasks assessed/performed                Past Medical History:  Diagnosis Date   Anxiety    At risk for sleep apnea    STOP-BANG= 5      SENT TO PCP 04-05-2016   Chronic pain syndrome    History of kidney stones    Hyperlipidemia    Hypertension    Left ureteral stone    OA (osteoarthritis)    back, hip   Pain management    Type 2 diabetes mellitus (HCC)    Past Surgical History:  Procedure Laterality Date   TOE SURGERY Left    left 5th toe bone excision   TOTAL HIP ARTHROPLASTY Right 04/22/2011   TOTAL HIP ARTHROPLASTY Left 12/08/2023   Procedure: LEFT TOTAL HIP ARTHROPLASTY ANTERIOR APPROACH;  Surgeon: Arnie Lao, MD;  Location: [facility];  Service: Orthopedics;  Laterality: Left;   Patient Active Problem List   Diagnosis Date Noted   Status post total replacement of left hip 12/08/2023    PCP: Roselind Congo, MD   REFERRING PROVIDER: Arnie Lao, MD   REFERRING DIAG: [phone] (ICD-10-CM) - Status post total replacement of left hip   THERAPY DIAG:  Cervicalgia  Chronic bilateral low back pain with right-sided sciatica  Pain in thoracic spine  Other abnormalities of gait and mobility  Abnormal posture  Rationale for Evaluation and Treatment: Rehabilitation  ONSET DATE: Left hip THA surgery 12-07-24  SUBJECTIVE:   SUBJECTIVE STATEMENT: 04/16/2024 6/10 on Right flank pain,  0/10 sitting. 8/10 right neck pain. I just give out easily and get tired so easy now       EVAL-My Left THA   anterior approach on 12-07-24 by Dr Albina Hull.  I am now walking without AD. I got rid of my cane after 4 days.  I tried to do it all myself after I had PT at the hospital. I have not worked out as much as I have before I had hip surgery.  I have scoliosis.  I have a lot of neck pain 7-8/10 down to thoracic and down toward my right hip since surgery and I  have re aligned in a way, I often pop my neck sporadically.  I have gained 10 lb since surgery as well. Daily headaches sometimes 2 a day. Difficulty sleeping and cannot sleep on left side  PERTINENT HISTORY: Chronic pain syndrome, hyperlipidemia, HTN, OA DM Type 2, Pain managament. THR R 12 years ago May 2012.  Left shoulder hypermobility and chronic dislocation since basketball days. Surgery of 5th toe infection March 2003. Anxiety, THA Left 12-07-24. Boxer break 5th digit R hand PAIN:  Are you having pain? Yes: NPRS scale: Left hip 0/10, at worst 2/10, neck at rest 6.10 at worst 8/10,  thoracic pain/back at rest 5/10 at worst 9/10 Pain location: Right neck and right thoracic and bil low back radiating into R hip Pain description: aching and sharp  Aggravating factors: sitting longer than 1 hour, standing for 10 min, sleeping, ; Relieving factors: nothing  PRECAUTIONS: Anterior hip  RED FLAGS: None   WEIGHT BEARING RESTRICTIONS: Yes WBAT L  FALLS:  Has patient fallen in last 6 months? Yes. 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 Pain due to new realignment post surgery  NEXT MD VISIT: TBD  OBJECTIVE:  Note: Objective measures were completed at Evaluation unless otherwise noted.  DIAGNOSTIC FINDINGS: See medical record  PATIENT SURVEYS:  LEFS 47/80 58.8%    NDI  25.50  50%  COGNITION: Overall cognitive status: Within functional limits for tasks assessed     SENSATION: Has some numbness in bil hands and feet pt attributes to anemia  sometimes tingling on top of feet and I sometimes have fasciculations    MUSCLE LENGTH: :Thomas test Right WNL able to flatten deg; Left -15  from horizontal deg   POSTURE: Leans to the left and rotated to the left  Rounded shoulders, forward head, and scoliosis with R elevated pelvis and scoliotic curve leaning to the right in sitting and standing with flexed and  rotated to Left posture  PALPATION: Tightened Left Quadratus Lumborum, bil hamstring tightness  CERVICAL ROM:   Active ROM A/PROM (deg) eval  Flexion 32  Extension 62  Right lateral flexion 12  Left lateral flexion 24  Right rotation 40  Left rotation 62   (Blank rows = not tested)  LOWER EXTREMITY ROM:  Active ROM Right eval Left eval  Hip flexion 110 105  Hip extension    Hip abduction    Hip adduction    Hip internal rotation    Hip external rotation    Knee flexion    Knee extension    Ankle dorsiflexion    Ankle plantarflexion    Ankle inversion    Ankle eversion     (Blank rows = not tested)  LOWER EXTREMITY MMT:  MMT Right eval Left eval  Hip flexion 5 4  Hip extension 5 4  Hip abduction 5 3+  Hip adduction    Hip internal rotation    Hip external rotation    Knee flexion    Knee extension 5 5  Ankle dorsiflexion 5 5  Ankle plantarflexion    Ankle inversion    Ankle eversion     (Blank rows = not tested)  LOWER EXTREMITY SPECIAL TESTS:    FUNCTIONAL TESTS:  2 minute walk test: 339.5   1138.5 ft (1830 ft to 2205  Ft) GAIT: Distance walked: 150 Assistive device utilized: None Level of assistance: Complete Independence Comments: Walks with    TREATMENT:  [facility] Adult PT Treatment:  DATE: 04-16-24 Therapeutic Exercise: Quadriped posterior cervical with chin tuck with GTB  3 x 10 Sidelying neck AROM with chin tuck bil 2 x 10 Push Up with Plus  2 x 10 Manual Therapy: STW  Left UT/LS T-4 to T-8 thoracic paraspinals and Rib block T5/T-6 and Right QL Gentle PROM of cervical and end range overpressure UPA on  Right C-3, 4 ,5 6  Trigger Point Dry Needling  Subsequent Treatment: Instructions provided previously at initial dry needling treatment.  Instructions reviewed, if requested by the patient, prior to subsequent dry needling treatment.   Patient Verbal Consent Given: Yes Education Handout Provided: Previously Provided Muscles Treated:  Left UT/LS T-4 to T-8 thoracic paraspinals and Rib block T5/T-6 and Right QL Electrical Stimulation Performed: No Treatment Response/Outcome: muscle tension release  several twitch responses    Therapeutic Activity: 2 minute step test  32 steps in 37 seconds and gave out (Norm 87 -115) 6 MWT  1138.5 ft (1830 ft to 2205 Ft)  Self Care: Education on endurance activities, and rest L stretch for back but continuing to increase endurance and time at task  [facility] Adult PT Treatment:                                                DATE: 04/11/24 Therapeutic Exercise: Prone cervical extension x 15 Sidelying neck AROM with chin tuck bil 2 x 10 Push Up with Plus  2 x 10 Isometric Cervical Extension at Wall with Ball   1 x 15  Standing Isometric Cervical Extension and Bilateral Shoulder Flexion at Wall with Celeste Cola with Dumbbells 5#  Isometric Cervical Rotation at Wall with Ball1 x 15 each side Manual Therapy: STW of UT/LS  bil cervical paraspinals Gentle cervical distraction UPA on Right C-3 to C6 Trigger Point Dry  Needling  Subsequent Treatment: Instructions provided previously at initial dry needling treatment.  Instructions reviewed, if requested by the patient, prior to subsequent dry needling treatment.   Patient Verbal Consent Given: Yes Education Handout Provided: Previously Provided Muscles Treated: C-3 to C-6 on Right , UT/ LS Electrical Stimulation Performed: No Treatment Response/Outcome: twitch response and muscle tension release    Self Care: Discussed importance of slow walking and getting in movement rather than " conquering"  preventing spasms    [facility] Adult PT Treatment:                                                DATE: 03-28-24 Therapeutic Exercise: Prone Lower Trapezius Strengthening on Swiss Ball  bil x 10 Prone Middle Trapezius Strengthening on Swiss Ball with DB bil x10 Prone Shoulder Extension on Swiss Ball with DB  bil 1 x 10 Prone Lower Trapezius Strengthening on Swiss Ball 3 lb DB bil 2 x 10 Prone Middle Trapezius Strengthening on [employer] with DB bil and 3lb 2 x 10 Prone Shoulder Extension on [employer] with DB with 3 # 2 x 10 [place] with BTB Manual Therapy: STW- Left UT/LS T-4 to T-8 thoracic paraspinals and Rib block T5/T-6 Trigger Point Dry Needling  Subsequent Treatment: Instructions provided previously at initial dry needling treatment.  Instructions reviewed, if requested by the patient, prior to subsequent dry needling treatment.  Patient Verbal Consent Given: Yes Education Handout Provided: Previously Provided Muscles Treated: Left UT/LS T-4 to T-8 thoracic paraspinals and Rib block T5/T-6 Electrical Stimulation Performed: No Treatment Response/Outcome: twitch response and mx tension realease                                                                                                                          TREATMENT DATE: eval and TPDN Trigger Point Dry Needling  Initial Treatment: Pt instructed on Dry Needling rational, procedures, and  possible side effects. Pt instructed to expect mild to moderate muscle soreness later in the day and/or into the next day.  Pt instructed in methods to reduce muscle soreness. Pt instructed to continue prescribed HEP. Because Dry Needling was performed over or adjacent to a lung field, pt was educated on S/S of pneumothorax and to seek immediate medical attention should they occur.  Patient was educated on signs and symptoms of infection and other risk factors and advised to seek medical attention should they occur.  Patient verbalized understanding of these instructions and education.   Patient Verbal Consent Given: Yes Education Handout Provided: Yes Muscles Treated: Right UT and LS and rhomboids Electrical Stimulation Performed: No Treatment Response/Outcome: muscle twitch and release of muscle tension and pain      PATIENT EDUCATION:  Education details: rationale for interventions, HEP  Person educated: Patient Education method: Explanation, Demonstration, Tactile cues, Verbal cues Education comprehension: verbalized understanding, returned demonstration, verbal cues required, tactile cues required, and needs further education     HOME EXERCISE PROGRAM: Access Code: [email or web address] URL: [email or web address] Date: 03/28/2024 Prepared by: Sharlet Dawson  Exercises - Prone Lower Trapezius Strengthening on Swiss Ball with Dumbbells  - 1 x daily - 7 x weekly - 3 sets - 10 reps - Prone Middle Trapezius Strengthening on Swiss Ball with Dumbbells  - 1 x daily - 7 x weekly - 3 sets - 10 reps - Prone Shoulder Extension on Swiss Ball with Dumbbells  - 1 x daily - 7 x weekly - 3 sets - 10 reps Added 04-11-24 Program Notes For AROM of neck lie down and do Sideling and prone head lifts with chin tucks - Push Up with Plus  - 1 x daily - 7 x weekly - 2 sets - 10 reps - Isometric Cervical Extension at Wall with Ball  - 1 x daily - 7 x weekly - 3 sets - 10 reps - Standing Isometric  Cervical Extension and Bilateral Shoulder Flexion at Wall with Ball with Dumbbells  - 1 x daily - 7 x weekly - 3 sets - 10 reps - Isometric Cervical Rotation at [employer] with Ball  - 1 x daily - 7 x weekly - 3 sets - 10 reps ASSESSMENT:  CLINICAL IMPRESSION: 04/16/2024 Pt reports 7/10 pain and ending with 4/10 after session  [name] enters clinic with left neck spasm and subscapular weakness with increased pain to 8/10.  Pt consents to TPDN and is closely monitored  throughout session with twitch response and relief of neck spasm. Pt 2 minute step test  32 steps in 37 seconds and gave out (Norm 87 -115)  6 MWT  1138.5 ft (1830 ft to 2205 Ft). Pt left clinic with 2/10 pain and decreased muscle spasm and plan to address spasm on his own with exercise. Will continue to progress toward goals next visit   EVAL-Patient is a 53 y.o. male who was seen today for physical therapy evaluation and treatment for S/P L THA on 12-07-24 by Dr Lucienne Ryder. Pt also has subsequent neck pain on Right and Right upper back/low back due to new realignment  post surgery and subsequent  compensations. and needs skilled PT to address impairments limiting  AROM in cervical area and R sided thoracic and back pain  OBJECTIVE IMPAIRMENTS: decreased activity tolerance, decreased knowledge of condition, decreased mobility, difficulty walking, decreased ROM, decreased strength, increased fascial restrictions, improper body mechanics, postural dysfunction, and pain.   ACTIVITY LIMITATIONS: carrying, lifting, bending, sitting, standing, squatting, sleeping, stairs, locomotion level, and caring for others  PARTICIPATION LIMITATIONS: driving, community activity, yard work, church, and caring for special needs son  PERSONAL FACTORS: Chronic pain syndrome, hyperlipidemia, HTN, OA DM Type 2, Pain managament. THR R 12 years ago May 2012.  Left shoulder hypermobility and chronic dislocation since basketball days. Surgery of 5th toe infection March  2003. Anxiety, THA Left 12-07-24. Boxer break 5th digit R hand and scoliosis moderate are also affecting patient's functional outcome.   REHAB POTENTIAL: Good  CLINICAL DECISION MAKING: Evolving/moderate complexity  EVALUATION COMPLEXITY: Moderate   GOALS: Goals reviewed with patient? Yes  SHORT TERM GOALS: Target date: Apr 18, 2024  Pt will be independent with initial HEP Baseline:no knowledge Goal status: INITIAL  2.    Demonstrate understanding of  as neutral posture as possible with scoliosis and be more conscious of position and posture throughout the day.  Baseline: needs reinforcement Goal status: INITIAL  3.  Pt will be educated on going up and down steps forward motion without exacerbating pain Baseline: Pt descends backwards due to large feet and pain in left hip now alleviated by surgery Goal status: INITIAL  4.  Pt will perform DGI for balance assessment Baseline: Pt has fallen due to anemia but needs further balance assessment Goal status: INITIAL    LONG TERM GOALS: Target date: 05-21-24  Pt will be able to perform advanced HEP using gym  and lifting  Baseline: Pt post surgery since 12-07-24 but still not maximized in strength Goal status: INITIAL  2.  Pt will improve neck AROM in left lateral flexion by at least 10 degrees and improve pain by 50% in order to scan environment with greater ease Baseline:  Goal status: INITIAL  3.  Pt will be able to walk a mile without cramping and pain in R LE Baseline: Pt not able to walk for more than 2 minutes at eval Goal status: INITIAL  4.  Pt will be able to sleep 4 or more hours of restorative sleep and be able to sleep on left side Baseline: only sleeps on Right side. Goal status: INITIAL  5.  Pt will improve LEFS to at least 60/80 to show improved functional mobility Baseline: Eval 47/80 58.8% Goal status: INITIAL  6.  Pt will improved NDI to at least 36/50 to show improved neck and UE ability to perform  daily duties. Baseline: Eval 25/50  50% Goal status: INITIAL    7. Pt will be  able to perform 6 MWT to within normal limites   Baseline, 2 MWT339.5 ft   Goal status INITIAL   PLAN:  PT FREQUENCY: 1-2x/week  PT DURATION: 10 weeks  PLANNED INTERVENTIONS: 97164- PT Re-evaluation, 97110-Therapeutic exercises, 97530- Therapeutic activity, 97112- Neuromuscular re-education, 97535- Self Care, 16109- Manual therapy, [phone]- Gait training, [phone]- Electrical stimulation (manual), Patient/Family education, Balance training, Stair training, Taping, Dry Needling, Joint mobilization, Spinal mobilization, Cryotherapy, and Moist heat  PLAN FOR NEXT SESSION: TPDN, Manual, issue HEP, gait training Do DGI  Do DNF training  Sharlet Dawson, PT, ATR[facility] Certified Exercise Expert for the Aging Adult  04/16/24 4:44 PM Phone: [phone] Fax: [phone]

## 2024-04-17 ENCOUNTER — Other Ambulatory Visit (HOSPITAL_COMMUNITY): Payer: Self-pay

## 2024-04-17 MED ORDER — METFORMIN HCL ER 500 MG PO TB24
1000.0000 mg | ORAL_TABLET | Freq: Two times a day (BID) | ORAL | 0 refills | Status: DC
  Filled 2024-04-17: qty 360, 90d supply, fill #0

## 2024-04-18 ENCOUNTER — Other Ambulatory Visit (HOSPITAL_COMMUNITY): Payer: Self-pay

## 2024-04-23 ENCOUNTER — Ambulatory Visit: Admitting: Physical Therapy

## 2024-04-25 ENCOUNTER — Other Ambulatory Visit (HOSPITAL_COMMUNITY): Payer: Self-pay

## 2024-04-25 ENCOUNTER — Other Ambulatory Visit: Payer: Self-pay

## 2024-04-26 ENCOUNTER — Other Ambulatory Visit (HOSPITAL_COMMUNITY): Payer: Self-pay

## 2024-04-30 ENCOUNTER — Encounter: Payer: Self-pay | Admitting: Physical Therapy

## 2024-04-30 ENCOUNTER — Ambulatory Visit: Attending: Orthopaedic Surgery | Admitting: Physical Therapy

## 2024-04-30 DIAGNOSIS — M542 Cervicalgia: Secondary | ICD-10-CM | POA: Diagnosis not present

## 2024-04-30 DIAGNOSIS — R2689 Other abnormalities of gait and mobility: Secondary | ICD-10-CM | POA: Insufficient documentation

## 2024-04-30 DIAGNOSIS — M546 Pain in thoracic spine: Secondary | ICD-10-CM | POA: Insufficient documentation

## 2024-04-30 DIAGNOSIS — R293 Abnormal posture: Secondary | ICD-10-CM | POA: Diagnosis not present

## 2024-04-30 DIAGNOSIS — G8929 Other chronic pain: Secondary | ICD-10-CM | POA: Insufficient documentation

## 2024-04-30 DIAGNOSIS — M5441 Lumbago with sciatica, right side: Secondary | ICD-10-CM | POA: Insufficient documentation

## 2024-04-30 NOTE — Therapy (Addendum)
 OUTPATIENT PHYSICAL THERAPY LOWER EXTREMITY TREATMENT/DISCHARGE NOTE PHYSICAL THERAPY DISCHARGE SUMMARY  Visits from Start of Care: 5  Current functional level related to goals / functional outcomes: As indicated below   Remaining deficits: As indicated below.  Pt with Right neck pain and spasm that still affects pt occassionally with sleep. NDI worse than on eval.  But pt more affected by SOB and decreased stamina and low energy/anemia   Education / Equipment: HEP   Patient agrees to discharge. Patient goals were partially met. Patient is being discharged due to deferring to pursuing medical help for respiratory and endurance issues .  Pt is I with HEP for neck but wishes to pursue other medical care at this time.   Patient Name: Brandon Gordon MRN: [id number] DOB:04-08-1971, 53 y.o., male Today's Date: 04/30/2024  END OF SESSION:  PT End of Session - 04/30/24 1550     Visit Number 5    Number of Visits 9    Date for PT Re-Evaluation 05/07/24    Authorization Type [facility] [employer]    PT Start Time [phone]    PT Stop Time 1645    PT Time Calculation (min) 55 min    Activity Tolerance Patient limited by pain    Behavior During Therapy [facility] for tasks assessed/performed                 Past Medical History:  Diagnosis Date   Anxiety    At risk for sleep apnea    STOP-BANG= 5      SENT TO PCP 04-05-2016   Chronic pain syndrome    History of kidney stones    Hyperlipidemia    Hypertension    Left ureteral stone    OA (osteoarthritis)    back, hip   Pain management    Type 2 diabetes mellitus (HCC)    Past Surgical History:  Procedure Laterality Date   TOE SURGERY Left    left 5th toe bone excision   TOTAL HIP ARTHROPLASTY Right 04/22/2011   TOTAL HIP ARTHROPLASTY Left 12/08/2023   Procedure: LEFT TOTAL HIP ARTHROPLASTY ANTERIOR APPROACH;  Surgeon: Arnie Lao, MD;  Location: [facility];  Service: Orthopedics;  Laterality: Left;   Patient Active  Problem List   Diagnosis Date Noted   Status post total replacement of left hip 12/08/2023    PCP: Roselind Congo, MD   REFERRING PROVIDER: Arnie Lao, MD   REFERRING DIAG: [phone] (ICD-10-CM) - Status post total replacement of left hip   THERAPY DIAG:  Cervicalgia  Chronic bilateral low back pain with right-sided sciatica  Pain in thoracic spine  Other abnormalities of gait and mobility  Abnormal posture  Rationale for Evaluation and Treatment: Rehabilitation  ONSET DATE: Left hip THA surgery 12-07-24  SUBJECTIVE:   SUBJECTIVE STATEMENT: 04/30/2024 I am having pain in bil ankles, knees shoulders and neck but hips are great.  I am having a hard time getting my breath (pulse ox 98%) and PR 61. After needling last time, I was sore for more than 48 hours.  With 3-4 x on pain scale.  We are discussing with my PT wife about whether I have vascular, neuro vascular, autoimmune issue.  Hemoglobin was at 11 and now at 13.     EVAL-My Left THA  anterior approach on 12-07-24 by Dr Albina Hull.  I am now walking without AD. I got rid of my cane after 4 days.  I tried to do it all  myself after I had PT at the hospital. I have not worked out as much as I have before I had hip surgery.  I have scoliosis.  I have a lot of neck pain 7-8/10 down to thoracic and down toward my right hip since surgery and I  have re aligned in a way, I often pop my neck sporadically.  I have gained 10 lb since surgery as well. Daily headaches sometimes 2 a day. Difficulty sleeping and cannot sleep on left side  PERTINENT HISTORY: Chronic pain syndrome, hyperlipidemia, HTN, OA DM Type 2, Pain managament. THR R 12 years ago May 2012.  Left shoulder hypermobility and chronic dislocation since basketball days. Surgery of 5th toe infection March 2003. Anxiety, THA Left 12-07-24. Boxer break 5th digit R hand PAIN:  Are you having pain? Yes: NPRS scale: Left hip 0/10, at worst 2/10, neck at rest 6.10 at  worst 8/10,  thoracic pain/back at rest 5/10 at worst 9/10 Pain location: Right neck and right thoracic and bil low back radiating into R hip Pain description: aching and sharp Aggravating factors: sitting longer than 1 hour, standing for 10 min, sleeping, ; Relieving factors: nothing  PRECAUTIONS: Anterior hip  RED FLAGS: None   WEIGHT BEARING RESTRICTIONS: Yes WBAT L  FALLS:  Has patient fallen in last 6 months? Yes. 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 Pain due to new realignment post surgery  NEXT MD VISIT: TBD  OBJECTIVE:  Note: Objective measures were completed at Evaluation unless otherwise noted.  DIAGNOSTIC FINDINGS: See medical record  PATIENT SURVEYS:  LEFS 47/80 58.8% 04-30-24  66/80 82.5 %    NDI  25.50  50% 04-30-24  NDI 18/50  36.08% COGNITION: Overall cognitive status: Within functional limits for tasks assessed     SENSATION: Has some numbness in bil hands and feet pt attributes to anemia  sometimes tingling on top of feet and I sometimes have fasciculations    MUSCLE LENGTH: :Thomas test Right WNL able to flatten deg; Left -15  from horizontal deg   POSTURE: Leans to the left and rotated to the left  Rounded shoulders, forward head, and scoliosis with R elevated pelvis and scoliotic curve leaning to the right in sitting and standing with flexed and  rotated to Left posture  PALPATION: Tightened Left Quadratus  Lumborum, bil hamstring tightness  CERVICAL ROM:   Active ROM A/PROM (deg) eval AROM 04-30-24  Flexion 32   Extension 62   Right lateral flexion 12   Left lateral flexion 24   Right rotation 40 43  Left rotation 62 62   (Blank rows = not tested)  LOWER EXTREMITY ROM:  Active ROM Right eval Left eval R/L 04-30-24  Hip flexion 110 105 114/107  Hip extension     Hip abduction     Hip adduction     Hip internal rotation     Hip external rotation     Knee flexion  Knee extension     Ankle dorsiflexion     Ankle plantarflexion     Ankle inversion     Ankle eversion      (Blank rows = not tested)  LOWER EXTREMITY MMT:  MMT Right eval Left eval R/L 04-30-24  Hip flexion 5 4 5/4  Hip extension 5 4 5/4  Hip abduction 5 3+ 5/4  Hip adduction     Hip internal rotation     Hip external rotation     Knee flexion     Knee extension 5 5 5/5  Ankle dorsiflexion 5 5 5/5  Ankle plantarflexion     Ankle inversion     Ankle eversion      (Blank rows = not tested)  LOWER EXTREMITY SPECIAL TESTS:    FUNCTIONAL TESTS:  2 minute walk test: 339.5   1138.5 ft (1830 ft to 2205 Ft) GAIT: Distance walked: 150 Assistive device utilized: None Level of assistance: Complete Independence Comments: Walks with    TREATMENT:  [facility] Adult PT Treatment:                                                DATE: 04-30-24 Therapeutic Exercise: Quadriped posterior cervical with chin tuck with GTB  3 x 10 Sidelying neck AROM with chin tuck bil 2 x 10 Push Up with Plus  2 x 10 Manual Therapy: STW  Left UT/LS T-4 to T-8 thoracic paraspinals gentle distraction.  Side bend to right On foam roller pink lengthwise with chin tuck Trigger Point Dry Needling  Subsequent Treatment: Instructions provided previously at initial dry needling treatment.  Instructions reviewed, if requested by the patient, prior to subsequent dry needling treatment.   Patient Verbal Consent Given: Yes Education  Handout Provided: Previously Provided Muscles Treated: left UT/LS  T-4 to T-8 left, Teres major Electrical Stimulation Performed: Yes, Parameters: 22 pps to pt intensity as tolerated Treatment Response/Outcome: left neck tension release  Self Care: Scoliosis derotation education. Use of foam roller for neck spine alignment.  Importance of hydration and nutrition and sleep  [facility] Adult PT Treatment:                                                DATE: 04-16-24 Therapeutic Exercise: Quadriped posterior cervical with chin tuck with GTB  3 x 10 Sidelying neck AROM with chin tuck bil 2 x 10 Push Up with Plus  2 x 10 Manual Therapy: STW  Left UT/LS T-4 to T-8 thoracic paraspinals and Rib block T5/T-6 and Right QL Gentle PROM of cervical and end range overpressure UPA on  Right C-3, 4 ,5 6  Trigger Point Dry Needling  Subsequent Treatment: Instructions provided previously at initial dry needling treatment.  Instructions reviewed, if requested by the patient, prior to subsequent dry needling treatment.   Patient Verbal Consent Given: Yes Education Handout Provided: Previously Provided Muscles Treated:  Left UT/LS T-4 to T-8 thoracic paraspinals and Rib block T5/T-6 and Right QL Electrical Stimulation Performed: No Treatment Response/Outcome: muscle tension release  several twitch responses    Therapeutic Activity: 2 minute step test  32 steps in 37 seconds and gave out (Norm 87 -115) 6 MWT  1138.5 ft (1830 ft  to 2205 Ft)  Self Care: Education on endurance activities, and rest L stretch for back but continuing to increase endurance and time at task  [facility] Adult PT Treatment:                                                DATE: 04/11/24 Therapeutic Exercise: Prone cervical extension x 15 Sidelying neck AROM with chin tuck bil 2 x 10 Push Up with Plus  2 x 10 Isometric Cervical Extension at Wall with Ball   1 x 15  Standing Isometric Cervical Extension and Bilateral Shoulder Flexion at  Wall with Celeste Cola with Dumbbells 5#  Isometric Cervical Rotation at Wall with Ball1 x 15 each side Manual Therapy: STW of UT/LS  bil cervical paraspinals Gentle cervical distraction UPA on Right C-3 to C6 Trigger Point Dry Needling  Subsequent Treatment: Instructions provided previously at initial dry needling treatment.  Instructions reviewed, if requested by the patient, prior to subsequent dry needling treatment.   Patient Verbal Consent Given: Yes Education Handout Provided: Previously Provided Muscles Treated: C-3 to C-6 on Right , UT/ LS Electrical Stimulation Performed: No Treatment Response/Outcome: twitch response and muscle tension release    Self Care: Discussed importance of slow walking and getting in movement rather than " conquering"  preventing spasms    [facility] Adult PT Treatment:                                                DATE: 03-28-24 Therapeutic Exercise: Prone Lower Trapezius Strengthening on Swiss Ball  bil x 10 Prone Middle Trapezius Strengthening on Swiss Ball with DB bil x10 Prone Shoulder Extension on Swiss Ball with DB  bil 1 x 10 Prone Lower Trapezius Strengthening on Swiss Ball 3 lb DB bil 2 x 10 Prone Middle Trapezius Strengthening on [employer] with DB bil and 3lb 2 x 10 Prone Shoulder Extension on [employer] with DB with 3 # 2 x 10 [place] with BTB Manual Therapy: STW- Left UT/LS T-4 to T-8 thoracic paraspinals and Rib block T5/T-6 Trigger Point Dry Needling  Subsequent Treatment: Instructions provided previously at initial dry needling treatment.  Instructions reviewed, if requested by the patient, prior to subsequent dry needling treatment.   Patient Verbal Consent Given: Yes Education Handout Provided: Previously Provided Muscles Treated: Left UT/LS T-4 to T-8 thoracic paraspinals and Rib block T5/T-6 Electrical Stimulation Performed: No Treatment Response/Outcome: twitch response and mx tension realease                                                                                                                           TREATMENT DATE: eval and TPDN Trigger Point Dry Needling  Initial Treatment: Pt instructed on Dry Needling rational, procedures, and possible side effects. Pt instructed to expect mild to moderate muscle soreness later in the day and/or into the next day.  Pt instructed in methods to reduce muscle soreness. Pt instructed to continue prescribed HEP. Because Dry Needling was performed over or adjacent to a lung field, pt was educated on S/S of pneumothorax and to seek immediate medical attention should they occur.  Patient was educated on signs and symptoms of infection and other risk factors and advised to seek medical attention should they occur.  Patient verbalized understanding of these instructions and education.   Patient Verbal Consent Given: Yes Education Handout Provided: Yes Muscles Treated: Right UT and LS and rhomboids Electrical Stimulation Performed: No Treatment Response/Outcome: muscle twitch and release of muscle tension and pain      PATIENT EDUCATION:  Education details: rationale for interventions, HEP  Person educated: Patient Education method: Explanation, Demonstration, Tactile cues, Verbal cues Education comprehension: verbalized understanding, returned demonstration, verbal cues required, tactile cues required, and needs further education     HOME EXERCISE PROGRAM: Access Code: [email or web address] URL: [email or web address] Date: 03/28/2024 Prepared by: Sharlet Dawson  Exercises - Prone Lower Trapezius Strengthening on Swiss Ball with Dumbbells  - 1 x daily - 7 x weekly - 3 sets - 10 reps - Prone Middle Trapezius Strengthening on Swiss Ball with Dumbbells  - 1 x daily - 7 x weekly - 3 sets - 10 reps - Prone Shoulder Extension on Swiss Ball with Dumbbells  - 1 x daily - 7 x weekly - 3 sets - 10 reps Added 04-11-24 Program Notes For AROM of neck lie down and do  Sideling and prone head lifts with chin tucks - Push Up with Plus  - 1 x daily - 7 x weekly - 2 sets - 10 reps - Isometric Cervical Extension at Wall with Ball  - 1 x daily - 7 x weekly - 3 sets - 10 reps - Standing Isometric Cervical Extension and Bilateral Shoulder Flexion at Wall with Ball with Dumbbells  - 1 x daily - 7 x weekly - 3 sets - 10 reps - Isometric Cervical Rotation at [employer] with Ball  - 1 x daily - 7 x weekly - 3 sets - 10 reps ASSESSMENT:  CLINICAL IMPRESSION: 04/30/2024 Pt reports  pain in bil ankles, knees, (not hips) spine, bil shoulders and left neck. Pt is concerned about autoimmune symptoms and his ongoing difficulty wit " catching his breath and endurance issues. Mr Sheppard consistently exercises and even carries 20 lb weights routinely.  Some of his neck problems may derive from his new hip and body re alignment post hip surgery.  Mr. Pewitt has HEP that he is able to do to decrease pain. But it continues.  NDI is worse than his evaluation and LEF has improved.  Pt consented to TPDN with e stim this session and had relief which he assumes will be more short time limited.  Pt has agreed to discharge at this time and pursue some other medical intervention ruling out anxiety and other autoimmune disorders that may or may not continue contributing to he pain cycle and overall body pain.  Pt is an exemplary pt and person and a joy for whom to serve.  Will Discharge as pt requests. Pt  continues to experience new  anemia by pt report and endurance/ respiratory difficulties and  and lack of endurance with activities. Pt is going to DC at this  time with HEP and and is deferred to other medical specialities and return with new referral at a later date if necessary.     EVAL-Patient is a 52 y.o. male who was seen today for physical therapy evaluation and treatment for S/P L THA on 12-07-24 by Dr Lucienne Ryder. Pt also has subsequent neck pain on Right and Right upper back/low back due to new  realignment  post surgery and subsequent  compensations. and needs skilled PT to address impairments limiting  AROM in cervical area and R sided thoracic and back pain  OBJECTIVE IMPAIRMENTS: decreased activity tolerance, decreased knowledge of condition, decreased mobility, difficulty walking, decreased ROM, decreased strength, increased fascial restrictions, improper body mechanics, postural dysfunction, and pain.   ACTIVITY LIMITATIONS: carrying, lifting, bending, sitting, standing, squatting, sleeping, stairs, locomotion level, and caring for others  PARTICIPATION LIMITATIONS: driving, community activity, yard work, church, and caring for special needs son  PERSONAL FACTORS: Chronic pain syndrome, hyperlipidemia, HTN, OA DM Type 2, Pain managament. THR R 12 years ago May 2012.  Left shoulder hypermobility and chronic dislocation since basketball days. Surgery of 5th toe infection March 2003. Anxiety, THA Left 12-07-24. Boxer break 5th digit R hand and scoliosis moderate are also affecting patient's functional outcome.   REHAB POTENTIAL: Good  CLINICAL DECISION MAKING: Evolving/moderate complexity  EVALUATION COMPLEXITY: Moderate   GOALS: Goals reviewed with patient? Yes  SHORT TERM GOALS: Target date: Apr 18, 2024  Pt will be independent with initial HEP Baseline:no knowledge Goal status: MET  2.    Demonstrate understanding of  as neutral posture as possible with scoliosis and be more conscious of position and posture throughout the day.  Baseline: needs reinforcement Goal status:MET  3.  Pt will be educated on going up and down steps forward motion without exacerbating pain Baseline: Pt descends backwards due to large feet and pain in left hip now alleviated by surgery Goal status: MET  4.  Pt will perform DGI for balance assessment Baseline: Pt has fallen due to anemia but needs further balance assessment Goal status: DEFERRED    LONG TERM GOALS: Target date:  05-21-24  Pt will be able to perform advanced HEP using gym  and lifting  Baseline: Pt post surgery since 12-07-24 but still not maximized in strength Goal status:DEFERRED  2.  Pt will improve neck AROM in left lateral flexion by at least 10 degrees and improve pain by 50% in order to scan environment with greater ease Baseline: See AROM chart Goal status:ONGOING  3.  Pt will be able to walk a mile without cramping and pain in R LE Baseline: Pt not able to walk for more than 2 minutes at eval 04-30-24  Pt cramps after 6 minutes Goal status: NOT MET  4.  Pt will be able to sleep 4 or more hours of restorative sleep and be able to sleep on left side Baseline: only sleeps on Right side. 04-30-24  only sleeping 2-3 hours because of pain in your neck Goal status: IPartially met  5.  Pt will improve LEFS to at least 60/80 to show improved functional mobility Baseline: Eval 47/80 58.8% 04-30-24  66/80  82.5 % Goal status:MET  6.  Pt will improved NDI to at least 36/50 to show improved neck and UE ability to perform daily duties. Baseline: Eval 25/50  50% 04-30-24  18/50  36.08% Goal status: NOT MET   7. Pt will be able to perform 6 MWT to within normal limites   Baseline,  2 MWT339.5 ft   Goal status Deferred   PLAN:  PT FREQUENCY: 1-2x/week  PT DURATION: 10 weeks  PLANNED INTERVENTIONS: 97164- PT Re-evaluation, 97110-Therapeutic exercises, 97530- Therapeutic activity, 97112- Neuromuscular re-education, 97535- Self Care, 40981- Manual therapy, [phone]- Gait training, [phone]- Electrical stimulation (manual), Patient/Family education, Balance training, Stair training, Taping, Dry Needling, Joint mobilization, Spinal mobilization, Cryotherapy, and Moist heat  PLAN FOR NEXT SESSION: TPDN, Manual, issue HEP, gait training Do DGI  Do DNF training  Sharlet Dawson, PT, [facility] Certified Exercise Expert for the Aging Adult  04/30/24 5:11 PM Phone: [phone] Fax: [phone]   Sharlet Dawson, PT, [employer] Certified Exercise Expert for the Aging Adult  05/28/24 4:48 PM Phone: [phone] Fax: [phone]

## 2024-05-03 ENCOUNTER — Other Ambulatory Visit (HOSPITAL_COMMUNITY): Payer: Self-pay

## 2024-05-03 ENCOUNTER — Other Ambulatory Visit: Payer: Self-pay

## 2024-05-03 MED ORDER — BUPROPION HCL ER (XL) 300 MG PO TB24
300.0000 mg | ORAL_TABLET | Freq: Every morning | ORAL | 0 refills | Status: DC
  Filled 2024-05-03: qty 90, 90d supply, fill #0

## 2024-05-09 ENCOUNTER — Other Ambulatory Visit: Payer: Self-pay

## 2024-05-09 ENCOUNTER — Ambulatory Visit (INDEPENDENT_AMBULATORY_CARE_PROVIDER_SITE_OTHER): Admitting: Behavioral Health

## 2024-05-09 ENCOUNTER — Other Ambulatory Visit (HOSPITAL_COMMUNITY): Payer: Self-pay

## 2024-05-09 ENCOUNTER — Encounter: Payer: Self-pay | Admitting: Behavioral Health

## 2024-05-09 VITALS — BP 135/99 | HR 78 | Ht 78.0 in | Wt 246.0 lb

## 2024-05-09 DIAGNOSIS — F332 Major depressive disorder, recurrent severe without psychotic features: Secondary | ICD-10-CM

## 2024-05-09 DIAGNOSIS — Z79899 Other long term (current) drug therapy: Secondary | ICD-10-CM

## 2024-05-09 DIAGNOSIS — F411 Generalized anxiety disorder: Secondary | ICD-10-CM

## 2024-05-09 DIAGNOSIS — R5382 Chronic fatigue, unspecified: Secondary | ICD-10-CM

## 2024-05-09 DIAGNOSIS — G4700 Insomnia, unspecified: Secondary | ICD-10-CM | POA: Diagnosis not present

## 2024-05-09 MED ORDER — CARIPRAZINE HCL 1.5 MG PO CAPS
1.5000 mg | ORAL_CAPSULE | Freq: Every day | ORAL | 1 refills | Status: DC
  Filled 2024-05-09 – 2024-05-30 (×2): qty 30, 30d supply, fill #0
  Filled 2024-06-24: qty 30, 30d supply, fill #1

## 2024-05-09 MED ORDER — VILAZODONE HCL 40 MG PO TABS
40.0000 mg | ORAL_TABLET | Freq: Every day | ORAL | 0 refills | Status: DC
  Filled 2024-05-09: qty 90, 90d supply, fill #0

## 2024-05-09 MED ORDER — BUPROPION HCL ER (XL) 300 MG PO TB24
300.0000 mg | ORAL_TABLET | Freq: Every morning | ORAL | 0 refills | Status: DC
  Filled 2024-05-09 – 2024-07-31 (×2): qty 90, 90d supply, fill #0

## 2024-05-09 NOTE — Progress Notes (Signed)
 [facility] MD/PA/NP Initial Note  05/09/2024 11:31 AM Brandon Gordon  MRN:  [id number]  Chief Complaint:  Chief Complaint   Depression; Anxiety; Medication Problem; Medication Refill; Patient Education; Establish Care; Fatigue     HPI:   "Brandon Gordon", 53 year old male presents to this office for initial visit and to establish care.  Collateral information should be considered reliable.  Patient is pleasant but very talkative.  Sometimes has to be redirected.  Says that he has a long history of anxiety and depression stemming back 15+ years.  He has suffered from chronic pain due to joint problems for many years.  Says that he suffers from chronic fatigue that never seems to improve.  He has been under the care of a pain management physician and follows up with long-term PCP regularly.  There is history of physical dependency on narcotic pain medication.  He is currently on Zubsolv  a combination of buprenorphine  and naloxone .  He reports that he is taking alprazolam  concurrently.  He is also on high dose of gabapentin .  He reports continued problems with fatigue and anhedonia.  Says that he does not feel like doing the things that he used to do like playing golf or getting out in the yard.  Says he is struggled with feeling short of breath.  Says, "there are sessions where I felt like I could not breathe but yeah I was able to take in a deep breath".  Says that he has been assessed by cardiology and other specialist.  Any cardiac or respiratory problems have been ruled out.  Feels like his depression has worsened.  He reports depression today 6/10, and anxiety at 6/10.  His PHQ-9 was score of 20.  His MDQ had 5 of the 14 criterion marked yes.  He endorses getting less sleep and not missing it, increased talkative and he, racing thoughts, and problems concentrating.  Says that his mind often races at bedtime and has trouble falling asleep.  He denies history of mania, no psychosis, no auditory or visual  hallucinations, or delirium.  Denies SI or HI.  Says that he feels safe and verbally contracts for safety with this Clinical research associate.  Says that he fears death and would never harm self.  Acknowledges strong family support from spouse.  He is still working same job for 30+ years.  Has special needs son living at home.  Past psychiatric medication trial: Lexapro Prozac Viibryd  Wellbutrin       Visit Diagnosis:    ICD-10-CM   1. Severe episode of recurrent major depressive disorder, without psychotic features (HCC)  F33.2 cariprazine (VRAYLAR) 1.5 MG capsule    Vilazodone  HCl (VIIBRYD ) 40 MG TABS    buPROPion  (WELLBUTRIN  XL) 300 MG 24 hr tablet    2. Generalized anxiety disorder  F41.1     3. Chronic fatigue  R53.82     4. Insomnia, unspecified type  G47.00     5. Polypharmacy  Z79.899       Past Psychiatric History: Anxiety, MDD, Insomnia  Past Medical History:  Past Medical History:  Diagnosis Date   Anxiety    At risk for sleep apnea    STOP-BANG= 5      SENT TO PCP 04-05-2016   Chronic pain syndrome    History of kidney stones    Hyperlipidemia    Hypertension    Left ureteral stone    OA (osteoarthritis)    back, hip   Pain management    Type 2 diabetes mellitus (HCC)  Past Surgical History:  Procedure Laterality Date   TOE SURGERY Left    left 5th toe bone excision   TOTAL HIP ARTHROPLASTY Right 04/22/2011   TOTAL HIP ARTHROPLASTY Left 12/08/2023   Procedure: LEFT TOTAL HIP ARTHROPLASTY ANTERIOR APPROACH;  Surgeon: Arnie Lao, MD;  Location: [facility];  Service: Orthopedics;  Laterality: Left;    Family Psychiatric History: none noted  Family History: History reviewed. No pertinent family history.  Social History:  Social History   Socioeconomic History   Marital status: Married    Spouse name: Brandon Gordon   Number of children: 3   Years of education: 16   Highest education level: Bachelor's degree (e.g., BA, AB, BS)  Occupational History   Not on  file  Tobacco Use   Smoking status: Never   Smokeless tobacco: Never  Vaping Use   Vaping status: Never Used  Substance and Sexual Activity   Alcohol  use: Not Currently   Drug use: No   Sexual activity: Yes    Birth control/protection: None  Other Topics Concern   Not on file  Social History Narrative   Lives in [place] with wife and special needs son. Enjoys golf when feeling well. Tries to    Exercise when not uncomfortable with boxing work out bag, walking, outdoor activities.    Social Drivers of Corporate investment banker Strain: Not on file  Food Insecurity: No Food Insecurity (12/08/2023)   Hunger Vital Sign    Worried About Running Out of Food in the Last Year: Never true    Ran Out of Food in the Last Year: Never true  Transportation Needs: No Transportation Needs (12/08/2023)   PRAPARE - Administrator, Civil Service (Medical): No    Lack of Transportation (Non-Medical): No  Physical Activity: Not on file  Stress: Not on file  Social Connections: Unknown (05/03/2022)   Received from [facility], [facility]   Social Network    Social Network: Not on file    Allergies:  Allergies  Allergen Reactions   Cephalexin     Other reaction(s): stomach pain   Lexapro [Escitalopram Oxalate] Other (See Comments)    Sexual disfunction   Victoza [Liraglutide] Nausea Only    Metabolic Disorder Labs: Lab Results  Component Value Date   HGBA1C 7.5 (H) 11/29/2023   MPG 168.55 11/29/2023   No results found for: "PROLACTIN" No results found for: "CHOL", "TRIG", "HDL", "CHOLHDL", "VLDL", "LDLCALC" No results found for: "TSH"  Therapeutic Level Labs: No results found for: "LITHIUM" No results found for: "VALPROATE" No results found for: "CBMZ"  Current Medications: Current Outpatient Medications  Medication Sig Dispense Refill   cariprazine (VRAYLAR) 1.5 MG capsule Take 1 capsule (1.5 mg total) by mouth daily. 30 capsule 1   acetaminophen   (TYLENOL ) 325 MG tablet Take 650 mg by mouth every 6 (six) hours as needed for moderate pain or mild pain.     ALPRAZolam  (XANAX ) 1 MG tablet Take 2 tablets (2 mg total) by mouth daily as needed. 60 tablet 3   ALPRAZolam  (XANAX ) 1 MG tablet Take 2 tablets (2 mg total) by mouth daily as needed. 60 tablet 3   amLODipine  (NORVASC ) 5 MG tablet Take 1 tablet (5 mg total) by mouth daily. 30 tablet 5   atorvastatin  (LIPITOR) 40 MG tablet Take 1 tablet (40 mg total) by mouth daily. 90 tablet 2   atorvastatin  (LIPITOR) 40 MG tablet Take 1 tablet (40 mg total) by mouth daily.  90 tablet 0   azelastine  (ASTELIN ) 0.1 % nasal spray Place 1 spray into both nostrils 2 (two) times daily when pollen count is high. 30 mL 0   Buprenorphine  HCl-Naloxone  HCl (ZUBSOLV ) 0.7-0.18 MG SUBL Place 1 tablet onto the tongue daily. Must last 30 days. 30 tablet 0   buPROPion  (WELLBUTRIN  XL) 300 MG 24 hr tablet TAKE 1 TABLET BY MOUTH ONCE DAILY (Patient not taking: Reported on 11/28/2023) 90 tablet 3   buPROPion  (WELLBUTRIN  XL) 300 MG 24 hr tablet Take 1 tablet (300 mg total) by mouth in the morning. 90 tablet 0   chlorhexidine  (HIBICLENS ) 4 % external liquid Apply 15 mLs (1 Application total) topically as directed for 30 doses. Use as directed daily for 5 days every other week for 6 weeks. 946 mL 1   gabapentin  (NEURONTIN ) 600 MG tablet Take 1 tablet (600 mg total) by mouth 4 (four) times daily. 360 tablet 0   HYDROcodone -acetaminophen  (NORCO/VICODIN) 5-325 MG tablet Take 1-2 tablets by mouth every 6 (six) hours as needed for moderate pain (pain score 4-6). 30 tablet 0   ibuprofen (ADVIL) 200 MG tablet Take 400 mg by mouth every 6 (six) hours as needed for moderate pain (pain score 4-6).     levothyroxine  (SYNTHROID ) 25 MCG tablet Take 1 tablet orally in the morning on an empty stomach. 90 tablet 3   losartan  (COZAAR ) 100 MG tablet Take 1 tablet (100 mg total) by mouth daily. 30 tablet 5   losartan  (COZAAR ) 25 MG tablet Take 1  tablet (25 mg total) by mouth daily. 90 tablet 1   losartan  (COZAAR ) 50 MG tablet Take 1 tablet (50 mg total) by mouth daily. 30 tablet 5   metFORMIN  (GLUCOPHAGE -XR) 500 MG 24 hr tablet Take 2 tablets (1,000 mg total) by mouth 2 (two) times daily. 360 tablet 0   metoprolol  succinate (TOPROL -XL) 100 MG 24 hr tablet Take 1 tablet (100 mg total) by mouth daily. 90 tablet 0   mupirocin  ointment (BACTROBAN ) 2 % Place 1 Application into the nose 2 (two) times daily. Use as directed 2 times daily for 5 days every other week for 6 weeks. 66 g 1   ondansetron  (ZOFRAN ) 8 MG tablet Take 1 tablet (8 mg total) by mouth 3 (three) times daily as needed for nausea for 10 days 30 tablet 1   pantoprazole  (PROTONIX ) 40 MG tablet Take 1 tablet (40 mg total) by mouth 1/2 to 1 hour before morning meal for indigestion 30 tablet 0   Semaglutide ,0.25 or 0.5MG /DOS, (OZEMPIC , 0.25 OR 0.5 MG/DOSE,) 2 MG/3ML SOPN Inject 0.25 mg into the skin once a week. 3 mL 5   tadalafil (CIALIS) 20 MG tablet Take 20 mg by mouth daily as needed for erectile dysfunction.     testosterone  cypionate (DEPOTESTOSTERONE CYPIONATE) 200 MG/ML injection Inject 200 mg into the skin every 30 (thirty) days.     tiZANidine  (ZANAFLEX ) 4 MG tablet Take 1 tablet (4 mg total) by mouth every 6 (six) hours as needed for muscle spasms. 60 tablet 1   Vilazodone  HCl (VIIBRYD ) 40 MG TABS Take 1 tablet (40 mg total) by mouth daily with food. 90 tablet 0   Vitamin D , Ergocalciferol , (DRISDOL ) 1.25 MG (50000 UNIT) CAPS capsule Take 50,000 Units by mouth once a week.     No current facility-administered medications for this visit.    Medication Side Effects: none  Orders placed this visit:  No orders of the defined types were placed in this encounter.  Psychiatric Specialty Exam:  Review of Systems  Constitutional:  Positive for fatigue.  Respiratory:  Positive for shortness of breath.   Gastrointestinal:  Positive for nausea.  Genitourinary:  Positive for  frequency.  Musculoskeletal:  Positive for back pain, myalgias and neck pain.  Neurological:  Positive for dizziness and tremors.    Blood pressure (!) 135/99, pulse 78, height 6\' 6"  (1.981 m), weight 246 lb (111.6 kg).Body mass index is 28.43 kg/m.  General Appearance: Casual, Neat, and Well Groomed  Eye Contact:  Good  Speech:  Clear and Coherent  Volume:  Normal  Mood:  NA  Affect:  Appropriate  Thought Process:  Coherent  Orientation:  Full (Time, Place, and Person)  Thought Content: Logical   Suicidal Thoughts:  No  Homicidal Thoughts:  No  Memory:  WNL  Judgement:  Good  Insight:  Good  Psychomotor Activity:  Normal  Concentration:  Concentration: Good  Recall:  Good  Fund of Knowledge: Good  Language: Good  Assets:  Desire for Improvement  ADL's:  Intact  Cognition: WNL  Prognosis:  Good   Screenings:  PHQ2-9    Flowsheet Row Office Visit from 05/09/2024 in [facility]  PHQ-2 Total Score 6  PHQ-9 Total Score 20      Flowsheet Row Admission (Discharged) from 12/08/2023 in [place] [facility]  C-SSRS RISK CATEGORY No Risk       Receiving Psychotherapy: No   Treatment Plan/Recommendations:   Greater than 50% of 60 min face to face time with patient was spent on counseling and coordination of care. We discussed his long-term problems with anxiety and depression stemming back 15+ years.  He also had very candid discussion about his dependency on opioid pain medication for long period of time.  He was able to stop taking pain medication in January of this year however his pain management doctor placed him on Zubsolv , a combination drug of buprenorphine  and naloxone .  I explained to him that I was not comfortable in continuing a benzodiazepine with this medication and that he was not safe.  I explained that this could depress his respiratory system due to effects on the CNS.  He already acknowledges having idiopathic shortness of  breath which has not fully been determined.  I suspect that it could be anxiety combined with severe depression.  We also reviewed previous plan of care with PCP and prior psychiatric medication trials.  Patient is currently on a very high dose of gabapentin  which has been shown to cause fatigue and mental fogginess in many patients.  In some cases combinations of these medications with past history of opioid dependency can increase depression.  He is at risk for polypharmacy and I feel that a conversation needs to occur about reducing gabapentin  if feasible as well as weaning off Xanax  if he is going to remain on Subzolv.  At this point it is hard to determine etiology of his fatigue.  He otherwise has been medically cleared by his PCP and other specialty providers.  They were unable to determine any cardiac factors.   We agreed today to:  Will continue Viibryd  40 mg daily after breakfast.  Patient acknowledged he was not taking with food which is necessary for this medication to metabolize correctly. Will continue Wellbutrin  300 mg daily Will start Vraylar 1.5 mg daily in the a.m.  This medication is FDA approved as adjunctive therapy for MDD.  2 weeks samples and Rx drug card was provided  to the patient. Will report worsening symptoms or side effects promptly Will follow-up in 4 weeks to reassess Provided emergency contact information Discussed potential metabolic side effects associated with atypical antipsychotics, as well as potential risk for movement side effects. Advised pt to contact office if movement side effects occur.  He Discussed potential benefits, risk, and side effects of benzodiazepines to include potential risk of tolerance and dependence, as well as possible drowsiness.  Advised patient not to drive if experiencing drowsiness and to take lowest possible effective dose to minimize risk of dependence and tolerance.  Reviewed PDMP    Lincoln Renshaw, NP

## 2024-05-09 NOTE — Progress Notes (Deleted)
 [facility] MD/PA/NP Initial Note  05/09/2024 9:22 AM Brandon Gordon  MRN:  [id number]  Chief Complaint:   HPI:        Visit Diagnosis: No diagnosis found.  Past Psychiatric History:   Past Medical History:  Past Medical History:  Diagnosis Date   Anxiety    At risk for sleep apnea    STOP-BANG= 5      SENT TO PCP 04-05-2016   Chronic pain syndrome    History of kidney stones    Hyperlipidemia    Hypertension    Left ureteral stone    OA (osteoarthritis)    back, hip   Pain management    Type 2 diabetes mellitus (HCC)     Past Surgical History:  Procedure Laterality Date   TOE SURGERY Left    left 5th toe bone excision   TOTAL HIP ARTHROPLASTY Right 04/22/2011   TOTAL HIP ARTHROPLASTY Left 12/08/2023   Procedure: LEFT TOTAL HIP ARTHROPLASTY ANTERIOR APPROACH;  Surgeon: Arnie Lao, MD;  Location: [facility];  Service: Orthopedics;  Laterality: Left;    Family Psychiatric History: ***  Family History: No family history on file.  Social History:  Social History   Socioeconomic History   Marital status: Married    Spouse name: Not on file   Number of children: Not on file   Years of education: Not on file   Highest education level: Not on file  Occupational History   Not on file  Tobacco Use   Smoking status: Never   Smokeless tobacco: Never  Vaping Use   Vaping status: Never Used  Substance and Sexual Activity   Alcohol  use: Not Currently   Drug use: No   Sexual activity: Yes    Birth control/protection: None  Other Topics Concern   Not on file  Social History Narrative   Not on file   Social Drivers of Health   Financial Resource Strain: Not on file  Food Insecurity: No Food Insecurity (12/08/2023)   Hunger Vital Sign    Worried About Running Out of Food in the Last Year: Never true    Ran Out of Food in the Last Year: Never true  Transportation Needs: No Transportation Needs (12/08/2023)   PRAPARE - Scientist, research (physical sciences) (Medical): No    Lack of Transportation (Non-Medical): No  Physical Activity: Not on file  Stress: Not on file  Social Connections: Unknown (05/03/2022)   Received from [facility]   Social Network    Social Network: Not on file    Allergies:  Allergies  Allergen Reactions   Cephalexin     Other reaction(s): stomach pain   Lexapro [Escitalopram Oxalate] Other (See Comments)    Sexual disfunction   Victoza [Liraglutide] Nausea Only    Metabolic Disorder Labs: Lab Results  Component Value Date   HGBA1C 7.5 (H) 11/29/2023   MPG 168.55 11/29/2023   No results found for: "PROLACTIN" No results found for: "CHOL", "TRIG", "HDL", "CHOLHDL", "VLDL", "LDLCALC" No results found for: "TSH"  Therapeutic Level Labs: No results found for: "LITHIUM" No results found for: "VALPROATE" No results found for: "CBMZ"  Current Medications: Current Outpatient Medications  Medication Sig Dispense Refill   acetaminophen  (TYLENOL ) 325 MG tablet Take 650 mg by mouth every 6 (six) hours as needed for moderate pain or mild pain.     ALPRAZolam  (XANAX ) 1 MG tablet Take 2 tablets (2 mg total) by mouth daily as needed. 60  tablet 3   ALPRAZolam  (XANAX ) 1 MG tablet Take 2 tablets (2 mg total) by mouth daily as needed. 60 tablet 3   amLODipine  (NORVASC ) 5 MG tablet Take 1 tablet (5 mg total) by mouth daily. 30 tablet 5   atorvastatin  (LIPITOR) 40 MG tablet Take 1 tablet (40 mg total) by mouth daily. 90 tablet 2   atorvastatin  (LIPITOR) 40 MG tablet Take 1 tablet (40 mg total) by mouth daily. 90 tablet 0   azelastine  (ASTELIN ) 0.1 % nasal spray Place 1 spray into both nostrils 2 (two) times daily when pollen count is high. 30 mL 0   Buprenorphine  HCl-Naloxone  HCl (ZUBSOLV ) 0.7-0.18 MG SUBL Place 1 tablet onto the tongue daily. Must last 30 days. 30 tablet 0   buPROPion  (WELLBUTRIN  XL) 300 MG 24 hr tablet TAKE 1 TABLET BY MOUTH ONCE DAILY (Patient not taking: Reported on  11/28/2023) 90 tablet 3   buPROPion  (WELLBUTRIN  XL) 300 MG 24 hr tablet Take 1 tablet (300 mg total) by mouth in the morning. 90 tablet 0   chlorhexidine  (HIBICLENS ) 4 % external liquid Apply 15 mLs (1 Application total) topically as directed for 30 doses. Use as directed daily for 5 days every other week for 6 weeks. 946 mL 1   gabapentin  (NEURONTIN ) 600 MG tablet Take 1 tablet (600 mg total) by mouth 4 (four) times daily. 360 tablet 0   HYDROcodone -acetaminophen  (NORCO/VICODIN) 5-325 MG tablet Take 1-2 tablets by mouth every 6 (six) hours as needed for moderate pain (pain score 4-6). 30 tablet 0   ibuprofen (ADVIL) 200 MG tablet Take 400 mg by mouth every 6 (six) hours as needed for moderate pain (pain score 4-6).     levothyroxine  (SYNTHROID ) 25 MCG tablet Take 1 tablet orally in the morning on an empty stomach. 90 tablet 3   losartan  (COZAAR ) 100 MG tablet Take 1 tablet (100 mg total) by mouth daily. 30 tablet 5   losartan  (COZAAR ) 25 MG tablet Take 1 tablet (25 mg total) by mouth daily. 90 tablet 1   losartan  (COZAAR ) 50 MG tablet Take 1 tablet (50 mg total) by mouth daily. 30 tablet 5   metFORMIN  (GLUCOPHAGE -XR) 500 MG 24 hr tablet Take 2 tablets (1,000 mg total) by mouth 2 (two) times daily. 360 tablet 0   metoprolol  succinate (TOPROL -XL) 100 MG 24 hr tablet Take 1 tablet (100 mg total) by mouth daily. 90 tablet 0   mupirocin  ointment (BACTROBAN ) 2 % Place 1 Application into the nose 2 (two) times daily. Use as directed 2 times daily for 5 days every other week for 6 weeks. 66 g 1   ondansetron  (ZOFRAN ) 8 MG tablet Take 1 tablet (8 mg total) by mouth 3 (three) times daily as needed for nausea for 10 days 30 tablet 1   pantoprazole  (PROTONIX ) 40 MG tablet Take 1 tablet (40 mg total) by mouth 1/2 to 1 hour before morning meal for indigestion 30 tablet 0   Semaglutide ,0.25 or 0.5MG /DOS, (OZEMPIC , 0.25 OR 0.5 MG/DOSE,) 2 MG/3ML SOPN Inject 0.25 mg into the skin once a week. 3 mL 5   tadalafil  (CIALIS) 20 MG tablet Take 20 mg by mouth daily as needed for erectile dysfunction.     testosterone  cypionate (DEPOTESTOSTERONE CYPIONATE) 200 MG/ML injection Inject 200 mg into the skin every 30 (thirty) days.     tiZANidine  (ZANAFLEX ) 4 MG tablet Take 1 tablet (4 mg total) by mouth every 6 (six) hours as needed for muscle spasms. 60 tablet 1  Vilazodone  HCl (VIIBRYD ) 40 MG TABS Take 1 tablet (40 mg total) by mouth daily with food. 90 tablet 0   Vitamin D , Ergocalciferol , (DRISDOL ) 1.25 MG (50000 UNIT) CAPS capsule Take 50,000 Units by mouth once a week.     No current facility-administered medications for this visit.    Medication Side Effects: {Medication Side Effects (Optional):12147}  Orders placed this visit:  No orders of the defined types were placed in this encounter.   Psychiatric Specialty Exam:  Review of Systems  Constitutional: Negative.   Allergic/Immunologic: Negative.   Neurological: Negative.   Psychiatric/Behavioral:  Positive for dysphoric mood and sleep disturbance. The patient is nervous/anxious.     There were no vitals taken for this visit.There is no height or weight on file to calculate BMI.  General Appearance: Casual, Neat, and Well Groomed  Eye Contact:  Good  Speech:  Clear and Coherent  Volume:  Normal  Mood:  NA  Affect:  Appropriate  Thought Process:  Coherent  Orientation:  Full (Time, Place, and Person)  Thought Content: Logical   Suicidal Thoughts:  No  Homicidal Thoughts:  No  Memory:  WNL  Judgement:  Good  Insight:  Good  Psychomotor Activity:  Normal  Concentration:  Concentration: Good  Recall:  Good  Fund of Knowledge: Good  Language: Good  Assets:  Desire for Improvement  ADL's:  Intact  Cognition: WNL  Prognosis:  Good   Screenings:  PHQ2-9    Flowsheet Row Office Visit from 05/09/2024 in [facility]  PHQ-2 Total Score 6  PHQ-9 Total Score 20      Flowsheet Row Admission (Discharged) from  12/08/2023 in [place] [facility] [facility]  C-SSRS RISK CATEGORY No Risk       Receiving Psychotherapy: No   Treatment Plan/Recommendations:       Lincoln Renshaw, NP

## 2024-05-10 ENCOUNTER — Other Ambulatory Visit (HOSPITAL_COMMUNITY): Payer: Self-pay

## 2024-05-10 DIAGNOSIS — F1120 Opioid dependence, uncomplicated: Secondary | ICD-10-CM | POA: Diagnosis not present

## 2024-05-10 MED ORDER — ZUBSOLV 0.7-0.18 MG SL SUBL
1.0000 | SUBLINGUAL_TABLET | Freq: Every day | SUBLINGUAL | 3 refills | Status: AC
  Filled 2024-05-10 – 2024-05-14 (×3): qty 30, 30d supply, fill #0

## 2024-05-14 ENCOUNTER — Other Ambulatory Visit (HOSPITAL_COMMUNITY): Payer: Self-pay

## 2024-05-14 ENCOUNTER — Telehealth: Payer: Self-pay | Admitting: Behavioral Health

## 2024-05-14 NOTE — Telephone Encounter (Signed)
 This is not a common side effect from this medication and not suspect at this time. It is early and he should try to continue taking the medication if he can. Chronic pain can cause irritability and anger and delay treatment.

## 2024-05-14 NOTE — Telephone Encounter (Signed)
Pt notified of recommendation 

## 2024-05-14 NOTE — Telephone Encounter (Signed)
 Pt seen for initial visit on 5/22.   Will continue Viibryd  40 mg daily after breakfast.  Patient acknowledged he was not taking with food which is necessary for this medication to metabolize correctly. Will continue Wellbutrin  300 mg daily Will start Vraylar 1.5 mg daily in the a.m.  This medication is FDA approved as adjunctive therapy for MDD.  2 weeks samples and Rx drug card was provided to the patient.  He reports having rage since starting the Vraylar. He said he has a lot of stuff going on with different providers and is frustrated from his pain. He didn't know if this reaction is from the Vraylar. He realizes it hasn't been very long and he will to stick it out if you feel like sx will improve.

## 2024-05-14 NOTE — Telephone Encounter (Signed)
 Pt  called on 5/27 early am. He left message that he is unclear about the recent plan and med changes on last office visit and needs someone to go over them with him.

## 2024-05-15 ENCOUNTER — Other Ambulatory Visit (HOSPITAL_COMMUNITY): Payer: Self-pay

## 2024-05-15 DIAGNOSIS — E291 Testicular hypofunction: Secondary | ICD-10-CM | POA: Diagnosis not present

## 2024-05-21 ENCOUNTER — Telehealth: Payer: Self-pay | Admitting: Orthopaedic Surgery

## 2024-05-21 NOTE — Telephone Encounter (Signed)
 Worked in with Norma Beckers on Thursday afternoon.

## 2024-05-21 NOTE — Telephone Encounter (Signed)
 Patient called and said he has severe pinch nerve in his neck. He can't turn his neck to the right and it happen at PT. He said he doesn't know what to do. CB#[phone]

## 2024-05-22 ENCOUNTER — Other Ambulatory Visit (HOSPITAL_COMMUNITY): Payer: Self-pay

## 2024-05-23 ENCOUNTER — Other Ambulatory Visit (HOSPITAL_COMMUNITY): Payer: Self-pay

## 2024-05-23 ENCOUNTER — Encounter: Payer: Self-pay | Admitting: Physician Assistant

## 2024-05-23 ENCOUNTER — Other Ambulatory Visit (INDEPENDENT_AMBULATORY_CARE_PROVIDER_SITE_OTHER): Payer: Self-pay

## 2024-05-23 ENCOUNTER — Ambulatory Visit (INDEPENDENT_AMBULATORY_CARE_PROVIDER_SITE_OTHER): Admitting: Physician Assistant

## 2024-05-23 DIAGNOSIS — M542 Cervicalgia: Secondary | ICD-10-CM | POA: Diagnosis not present

## 2024-05-23 MED ORDER — MELOXICAM 7.5 MG PO TABS
7.5000 mg | ORAL_TABLET | Freq: Every day | ORAL | 0 refills | Status: AC
  Filled 2024-05-23: qty 30, 30d supply, fill #0

## 2024-05-23 NOTE — Progress Notes (Signed)
 Office Visit Note   Patient: Brandon Gordon           Date of Birth: 01-03-1971           MRN: [id number] Visit Date: 05/23/2024              Requested by: Roselind Congo, MD [phone] [address] [place],  [place] [address] PCP: Roselind Congo, MD   Assessment & Plan: Visit Diagnoses:  1. Neck pain     Plan: Patient is a pleasant 53 year old gentleman with a here history of thoracic scoliosis and back pain.  He is a patient of Dr. Arvella Bird has done hip replacements.  He was being seen for physical therapy for his neck.  He does very well with dry needling.  About 3 to 4 weeks ago the needle hit something in his neck on the right side it created she pain that radiated down to his shoulder.  Feels like a neuropathic pain.  He is neurologically intact today certainly has significant degenerative changes in his spine.  He is getting a little bit better.  We talked about steroids but am a little worried considering he is a diabetic his last hemoglobin A1c was 7.5 in December.  Since he is getting better we talked about just trying an anti-inflammatory such as meloxicam he is in agreement with this plan of called this in for him may follow-up with Dr. Lucienne Ryder or Buster Cash as needed  Follow-Up Instructions: Return if symptoms worsen or fail to improve.   Orders:  Orders Placed This Encounter  Procedures   XR Cervical Spine 2 or 3 views   Meds ordered this encounter  Medications   meloxicam (MOBIC) 7.5 MG tablet    Sig: Take 1 tablet (7.5 mg total) by mouth daily.    Dispense:  30 tablet    Refill:  0      Procedures: No procedures performed   Clinical Data: No additional findings.   Subjective: No chief complaint on file.   HPI patient is a pleasant 53 year old gentleman who presents today with a chief complaint of right-sided neck pain.  He said he was having needling about 3 weeks ago and since then he has had pain and burning from the ear down the right side of the  neck.  If he is relaxed he has no pain  Review of Systems  All other systems reviewed and are negative.    Objective: Vital Signs: There were no vitals taken for this visit.  Physical Exam Constitutional:      Appearance: Normal appearance.  Pulmonary:     Effort: Pulmonary effort is normal.  Skin:    General: Skin is warm and dry.  Neurological:     General: No focal deficit present.     Mental Status: He is alert and oriented to person, place, and time.  Psychiatric:        Mood and Affect: Mood normal.        Behavior: Behavior normal.     Ortho Exam Examination of his neck he has no pain with flexion or extension of his neck has some irritation from right neck down into the shoulder with turning to the left and some turning to the right negative Spurling's test.  He has focal tenderness in paravertebral of his cervical spine but no redness no crepitus.  His strength is 5 out of 5 with biceps triceps abduction and grip strength.  He does have some  altered sensation in his hands but this is bilateral Specialty Comments:  No specialty comments available.  Imaging: XR Cervical Spine 2 or 3 views Result Date: 05/23/2024 Radiographs of his cervical spine demonstrate advanced degeneration of several levels.  He is endplate and joint space narrowing cannot appreciate any acute fractures    PMFS History: Patient Active Problem List   Diagnosis Date Noted   Neck pain 05/23/2024   Status post total replacement of left hip 12/08/2023   Past Medical History:  Diagnosis Date   Anxiety    At risk for sleep apnea    STOP-BANG= 5      SENT TO PCP 04-05-2016   Chronic pain syndrome    History of kidney stones    Hyperlipidemia    Hypertension    Left ureteral stone    OA (osteoarthritis)    back, hip   Pain management    Type 2 diabetes mellitus (HCC)     History reviewed. No pertinent family history.  Past Surgical History:  Procedure Laterality Date   TOE SURGERY Left     left 5th toe bone excision   TOTAL HIP ARTHROPLASTY Right 04/22/2011   TOTAL HIP ARTHROPLASTY Left 12/08/2023   Procedure: LEFT TOTAL HIP ARTHROPLASTY ANTERIOR APPROACH;  Surgeon: Arnie Lao, MD;  Location: [facility];  Service: Orthopedics;  Laterality: Left;   Social History   Occupational History   Not on file  Tobacco Use   Smoking status: Never   Smokeless tobacco: Never  Vaping Use   Vaping status: Never Used  Substance and Sexual Activity   Alcohol  use: Not Currently   Drug use: No   Sexual activity: Yes    Birth control/protection: None

## 2024-05-27 ENCOUNTER — Encounter: Payer: Self-pay | Admitting: Orthopaedic Surgery

## 2024-05-27 DIAGNOSIS — M542 Cervicalgia: Secondary | ICD-10-CM

## 2024-05-27 DIAGNOSIS — M25552 Pain in left hip: Secondary | ICD-10-CM

## 2024-05-30 ENCOUNTER — Other Ambulatory Visit (HOSPITAL_COMMUNITY): Payer: Self-pay

## 2024-05-31 ENCOUNTER — Telehealth: Payer: Self-pay | Admitting: Physician Assistant

## 2024-05-31 NOTE — Telephone Encounter (Signed)
Could you please advise on message below.  Thank you. 

## 2024-05-31 NOTE — Telephone Encounter (Signed)
 Patient called and wants to know if he can take it twice a day instead of once a day. Meloxicam , CB#[phone]

## 2024-05-31 NOTE — Telephone Encounter (Signed)
 Called and left a VM advising patient of Adriana Hopping Anne's message and to [facility] with any questions

## 2024-06-06 ENCOUNTER — Encounter: Payer: Self-pay | Admitting: Behavioral Health

## 2024-06-06 ENCOUNTER — Ambulatory Visit: Admitting: Behavioral Health

## 2024-06-06 ENCOUNTER — Other Ambulatory Visit (HOSPITAL_COMMUNITY): Payer: Self-pay

## 2024-06-06 DIAGNOSIS — F331 Major depressive disorder, recurrent, moderate: Secondary | ICD-10-CM

## 2024-06-06 DIAGNOSIS — F411 Generalized anxiety disorder: Secondary | ICD-10-CM

## 2024-06-06 DIAGNOSIS — R5382 Chronic fatigue, unspecified: Secondary | ICD-10-CM

## 2024-06-06 DIAGNOSIS — G4700 Insomnia, unspecified: Secondary | ICD-10-CM | POA: Diagnosis not present

## 2024-06-06 NOTE — Progress Notes (Signed)
 [facility]  Patient ID: Brandon Gordon,  MRN: [id number]  PCP: Roselind Congo, MD  Date of Evaluation: 06/06/2024 Time spent:30 minutes  Chief Complaint:  Chief Complaint   Depression; Anxiety; Patient Education; Follow-up; Stress; Irritability     HISTORY/CURRENT STATUS: HPI 73, 53 year old male presents to this office for initial visit and to establish care.  Collateral information should be considered reliable.  Patient is very gracious and cooperative.  So far reporting significant improvement with anxiety, depression and irritability.  Feels that Vraylar  so far is working very well. No undesirable side effects.  He reports depression today 3/10, and anxiety at 3/10. Eating well.  He agrees to stay on course with current medication regimen .He denies history of mania, no psychosis, no auditory or visual hallucinations, or delirium.  Denies SI or HI.  Says that he feels safe and verbally contracts for safety with this Clinical research associate.  Says that he fears death and would never harm self.  Acknowledges strong family support from spouse.  He is still working same job for 30+ years.  Has special needs son living at home.   Past psychiatric medication trial: Lexapro Prozac Viibryd  Wellbutrin    Individual Medical History/ Review of Systems: Changes? :No   Allergies: Cephalexin, Lexapro [escitalopram oxalate], and Victoza [liraglutide]  Current Medications:  Current Outpatient Medications:    ALPRAZolam  (XANAX ) 1 MG tablet, Take 2 tablets (2 mg total) by mouth daily as needed., Disp: 60 tablet, Rfl: 3   ALPRAZolam  (XANAX ) 1 MG tablet, Take 2 tablets (2 mg total) by mouth daily as needed., Disp: 60 tablet, Rfl: 3   amLODipine  (NORVASC ) 5 MG tablet, Take 1 tablet (5 mg total) by mouth daily., Disp: 30 tablet, Rfl: 5   atorvastatin  (LIPITOR) 40 MG tablet, Take 1 tablet (40 mg total) by mouth daily., Disp: 90 tablet, Rfl: 2   atorvastatin  (LIPITOR) 40 MG tablet, Take 1 tablet  (40 mg total) by mouth daily., Disp: 90 tablet, Rfl: 0   azelastine  (ASTELIN ) 0.1 % nasal spray, Place 1 spray into both nostrils 2 (two) times daily when pollen count is high., Disp: 30 mL, Rfl: 0   Buprenorphine  HCl-Naloxone  HCl (ZUBSOLV ) 0.7-0.18 MG SUBL, Place 1 tablet onto the tongue daily., Disp: 30 tablet, Rfl: 3   buPROPion  (WELLBUTRIN  XL) 300 MG 24 hr tablet, Take 1 tablet (300 mg total) by mouth in the morning., Disp: 90 tablet, Rfl: 0   cariprazine  (VRAYLAR ) 1.5 MG capsule, Take 1 capsule (1.5 mg total) by mouth daily., Disp: 30 capsule, Rfl: 1   gabapentin  (NEURONTIN ) 600 MG tablet, Take 1 tablet (600 mg total) by mouth 4 (four) times daily., Disp: 360 tablet, Rfl: 0   levothyroxine  (SYNTHROID ) 25 MCG tablet, Take 1 tablet orally in the morning on an empty stomach., Disp: 90 tablet, Rfl: 3   losartan  (COZAAR ) 100 MG tablet, Take 1 tablet (100 mg total) by mouth daily., Disp: 30 tablet, Rfl: 5   losartan  (COZAAR ) 25 MG tablet, Take 1 tablet (25 mg total) by mouth daily., Disp: 90 tablet, Rfl: 1   meloxicam  (MOBIC ) 7.5 MG tablet, Take 1 tablet (7.5 mg total) by mouth daily., Disp: 30 tablet, Rfl: 0   metFORMIN  (GLUCOPHAGE -XR) 500 MG 24 hr tablet, Take 2 tablets (1,000 mg total) by mouth 2 (two) times daily., Disp: 360 tablet, Rfl: 0   metoprolol  succinate (TOPROL -XL) 100 MG 24 hr tablet, Take 1 tablet (100 mg total) by mouth daily., Disp: 90 tablet, Rfl: 0   ondansetron  (  ZOFRAN ) 8 MG tablet, Take 1 tablet (8 mg total) by mouth 3 (three) times daily as needed for nausea for 10 days, Disp: 30 tablet, Rfl: 1   pantoprazole  (PROTONIX ) 40 MG tablet, Take 1 tablet (40 mg total) by mouth 1/2 to 1 hour before morning meal for indigestion, Disp: 30 tablet, Rfl: 0   Semaglutide ,0.25 or 0.5MG /DOS, (OZEMPIC , 0.25 OR 0.5 MG/DOSE,) 2 MG/3ML SOPN, Inject 0.25 mg into the skin once a week., Disp: 3 mL, Rfl: 5   tadalafil (CIALIS) 20 MG tablet, Take 20 mg by mouth daily as needed for erectile dysfunction.,  Disp: , Rfl:    testosterone  cypionate (DEPOTESTOSTERONE CYPIONATE) 200 MG/ML injection, Inject 200 mg into the skin every 30 (thirty) days., Disp: , Rfl:    tiZANidine  (ZANAFLEX ) 4 MG tablet, Take 1 tablet (4 mg total) by mouth every 6 (six) hours as needed for muscle spasms., Disp: 60 tablet, Rfl: 1   Vilazodone  HCl (VIIBRYD ) 40 MG TABS, Take 1 tablet (40 mg total) by mouth daily with food., Disp: 90 tablet, Rfl: 0 Medication Side Effects: none  Family Medical/ Social History: Changes? No  MENTAL HEALTH EXAM:  There were no vitals taken for this visit.There is no height or weight on file to calculate BMI.  General Appearance: Casual, Neat, and Well Groomed  Eye Contact:  Good  Speech:  Clear and Coherent  Volume:  Normal  Mood:  NA  Affect:  Appropriate  Thought Process:  Coherent  Orientation:  Full (Time, Place, and Person)  Thought Content: Logical   Suicidal Thoughts:  No  Homicidal Thoughts:  No  Memory:  WNL  Judgement:  Good  Insight:  Good  Psychomotor Activity:  Normal  Concentration:  Concentration: Good  Recall:  Good  Fund of Knowledge: Good  Language: Good  Assets:  Desire for Improvement  ADL's:  Intact  Cognition: WNL  Prognosis:  Good    DIAGNOSES:    ICD-10-CM   1. Major depressive disorder, recurrent episode, moderate (HCC)  F33.1     2. Generalized anxiety disorder  F41.1     3. Chronic fatigue  R53.82     4. Insomnia, unspecified type  G47.00       Receiving Psychotherapy: No    RECOMMENDATIONS: Greater than 50% of 30 min face to face time with patient was spent on counseling and coordination of care. We discussed his improvement in anxiety and depression since starting Vralar. Also noticed decrease in anger reaction and irritability. For now,  happy with psychiatric medication. Requesting no medication adjustments this visit.   We agreed today to:   Will continue Viibryd  40 mg daily after breakfast.  Patient acknowledged he was not taking  with food which is necessary for this medication to metabolize correctly. Will continue Wellbutrin  300 mg daily To continue  Vraylar  1.5 mg daily in the a.m.  This medication is FDA approved as adjunctive therapy for MDD.  2 weeks samples and Rx drug card was provided to the patient. Will report worsening symptoms or side effects promptly Will follow-up in 8  weeks to reassess Provided emergency contact information Discussed potential metabolic side effects associated with atypical antipsychotics, as well as potential risk for movement side effects. Advised pt to contact office if movement side effects occur.  He Discussed potential benefits, risk, and side effects of benzodiazepines to include potential risk of tolerance and dependence, as well as possible drowsiness.  Advised patient not to drive if experiencing drowsiness and to take  lowest possible effective dose to minimize risk of dependence and tolerance.  Reviewed PDMP     Lincoln Renshaw, NP

## 2024-06-07 ENCOUNTER — Other Ambulatory Visit (HOSPITAL_COMMUNITY): Payer: Self-pay

## 2024-06-07 MED ORDER — GABAPENTIN 600 MG PO TABS
600.0000 mg | ORAL_TABLET | Freq: Four times a day (QID) | ORAL | 1 refills | Status: DC
  Filled 2024-06-07 – 2024-06-08 (×3): qty 360, 90d supply, fill #0
  Filled 2024-09-04: qty 360, 90d supply, fill #1

## 2024-06-08 ENCOUNTER — Other Ambulatory Visit (HOSPITAL_COMMUNITY): Payer: Self-pay

## 2024-06-10 ENCOUNTER — Other Ambulatory Visit (HOSPITAL_COMMUNITY): Payer: Self-pay

## 2024-06-10 DIAGNOSIS — G609 Hereditary and idiopathic neuropathy, unspecified: Secondary | ICD-10-CM | POA: Diagnosis not present

## 2024-06-10 DIAGNOSIS — D538 Other specified nutritional anemias: Secondary | ICD-10-CM | POA: Diagnosis not present

## 2024-06-10 DIAGNOSIS — F419 Anxiety disorder, unspecified: Secondary | ICD-10-CM | POA: Diagnosis not present

## 2024-06-10 DIAGNOSIS — Z125 Encounter for screening for malignant neoplasm of prostate: Secondary | ICD-10-CM | POA: Diagnosis not present

## 2024-06-10 DIAGNOSIS — E538 Deficiency of other specified B group vitamins: Secondary | ICD-10-CM | POA: Diagnosis not present

## 2024-06-10 DIAGNOSIS — E782 Mixed hyperlipidemia: Secondary | ICD-10-CM | POA: Diagnosis not present

## 2024-06-10 DIAGNOSIS — R5383 Other fatigue: Secondary | ICD-10-CM | POA: Diagnosis not present

## 2024-06-10 DIAGNOSIS — E1165 Type 2 diabetes mellitus with hyperglycemia: Secondary | ICD-10-CM | POA: Diagnosis not present

## 2024-06-10 DIAGNOSIS — I10 Essential (primary) hypertension: Secondary | ICD-10-CM | POA: Diagnosis not present

## 2024-06-10 DIAGNOSIS — E039 Hypothyroidism, unspecified: Secondary | ICD-10-CM | POA: Diagnosis not present

## 2024-06-10 DIAGNOSIS — E291 Testicular hypofunction: Secondary | ICD-10-CM | POA: Diagnosis not present

## 2024-06-10 DIAGNOSIS — M791 Myalgia, unspecified site: Secondary | ICD-10-CM | POA: Diagnosis not present

## 2024-06-10 DIAGNOSIS — F1120 Opioid dependence, uncomplicated: Secondary | ICD-10-CM | POA: Diagnosis not present

## 2024-06-10 DIAGNOSIS — E118 Type 2 diabetes mellitus with unspecified complications: Secondary | ICD-10-CM | POA: Diagnosis not present

## 2024-06-11 ENCOUNTER — Other Ambulatory Visit (HOSPITAL_COMMUNITY): Payer: Self-pay

## 2024-06-11 DIAGNOSIS — Z79899 Other long term (current) drug therapy: Secondary | ICD-10-CM | POA: Diagnosis not present

## 2024-06-11 DIAGNOSIS — E118 Type 2 diabetes mellitus with unspecified complications: Secondary | ICD-10-CM | POA: Diagnosis not present

## 2024-06-11 MED ORDER — ZUBSOLV 0.7-0.18 MG SL SUBL
1.0000 | SUBLINGUAL_TABLET | Freq: Every day | SUBLINGUAL | 3 refills | Status: DC
  Filled 2024-06-14: qty 30, 30d supply, fill #0
  Filled 2024-07-12: qty 30, 30d supply, fill #1

## 2024-06-12 ENCOUNTER — Other Ambulatory Visit (HOSPITAL_COMMUNITY): Payer: Self-pay

## 2024-06-13 ENCOUNTER — Other Ambulatory Visit (HOSPITAL_COMMUNITY): Payer: Self-pay

## 2024-06-14 ENCOUNTER — Other Ambulatory Visit (HOSPITAL_COMMUNITY): Payer: Self-pay

## 2024-06-18 ENCOUNTER — Other Ambulatory Visit: Payer: Self-pay

## 2024-06-19 DIAGNOSIS — R0681 Apnea, not elsewhere classified: Secondary | ICD-10-CM | POA: Diagnosis not present

## 2024-06-19 DIAGNOSIS — G609 Hereditary and idiopathic neuropathy, unspecified: Secondary | ICD-10-CM | POA: Diagnosis not present

## 2024-06-19 DIAGNOSIS — G4720 Circadian rhythm sleep disorder, unspecified type: Secondary | ICD-10-CM | POA: Diagnosis not present

## 2024-06-19 DIAGNOSIS — I10 Essential (primary) hypertension: Secondary | ICD-10-CM | POA: Diagnosis not present

## 2024-06-19 DIAGNOSIS — E782 Mixed hyperlipidemia: Secondary | ICD-10-CM | POA: Diagnosis not present

## 2024-06-19 DIAGNOSIS — F324 Major depressive disorder, single episode, in partial remission: Secondary | ICD-10-CM | POA: Diagnosis not present

## 2024-06-19 DIAGNOSIS — J988 Other specified respiratory disorders: Secondary | ICD-10-CM | POA: Diagnosis not present

## 2024-06-19 DIAGNOSIS — R0683 Snoring: Secondary | ICD-10-CM | POA: Diagnosis not present

## 2024-06-19 DIAGNOSIS — E1165 Type 2 diabetes mellitus with hyperglycemia: Secondary | ICD-10-CM | POA: Diagnosis not present

## 2024-06-19 DIAGNOSIS — F419 Anxiety disorder, unspecified: Secondary | ICD-10-CM | POA: Diagnosis not present

## 2024-06-24 ENCOUNTER — Other Ambulatory Visit: Payer: Self-pay

## 2024-06-24 ENCOUNTER — Other Ambulatory Visit: Payer: Self-pay | Admitting: Physician Assistant

## 2024-06-24 ENCOUNTER — Other Ambulatory Visit (HOSPITAL_COMMUNITY): Payer: Self-pay

## 2024-06-24 ENCOUNTER — Telehealth: Payer: Self-pay | Admitting: Physician Assistant

## 2024-06-24 NOTE — Telephone Encounter (Signed)
 Pt called requesting a refill of meloixcam. Please send to pharmacy on file. Pt phone number [phone].

## 2024-06-25 ENCOUNTER — Encounter (HOSPITAL_COMMUNITY): Payer: Self-pay

## 2024-06-25 ENCOUNTER — Other Ambulatory Visit (HOSPITAL_COMMUNITY): Payer: Self-pay

## 2024-06-25 MED ORDER — METOPROLOL SUCCINATE ER 100 MG PO TB24
100.0000 mg | ORAL_TABLET | Freq: Every day | ORAL | 0 refills | Status: DC
  Filled 2024-06-25: qty 90, 90d supply, fill #0

## 2024-07-01 ENCOUNTER — Other Ambulatory Visit (HOSPITAL_COMMUNITY): Payer: Self-pay

## 2024-07-02 ENCOUNTER — Other Ambulatory Visit (HOSPITAL_COMMUNITY): Payer: Self-pay

## 2024-07-02 MED ORDER — LOSARTAN POTASSIUM 50 MG PO TABS
50.0000 mg | ORAL_TABLET | Freq: Every day | ORAL | 1 refills | Status: AC
  Filled 2024-07-02: qty 90, 90d supply, fill #0

## 2024-07-03 ENCOUNTER — Other Ambulatory Visit (HOSPITAL_COMMUNITY): Payer: Self-pay

## 2024-07-03 MED ORDER — ATORVASTATIN CALCIUM 40 MG PO TABS
40.0000 mg | ORAL_TABLET | Freq: Every day | ORAL | 0 refills | Status: DC
  Filled 2024-07-03: qty 90, 90d supply, fill #0

## 2024-07-09 DIAGNOSIS — G4733 Obstructive sleep apnea (adult) (pediatric): Secondary | ICD-10-CM | POA: Diagnosis not present

## 2024-07-10 DIAGNOSIS — E291 Testicular hypofunction: Secondary | ICD-10-CM | POA: Diagnosis not present

## 2024-07-11 ENCOUNTER — Other Ambulatory Visit (HOSPITAL_COMMUNITY): Payer: Self-pay

## 2024-07-11 DIAGNOSIS — F1120 Opioid dependence, uncomplicated: Secondary | ICD-10-CM | POA: Diagnosis not present

## 2024-07-11 MED ORDER — METFORMIN HCL ER 500 MG PO TB24
1000.0000 mg | ORAL_TABLET | Freq: Two times a day (BID) | ORAL | 0 refills | Status: DC
  Filled 2024-07-11: qty 360, 90d supply, fill #0

## 2024-07-11 MED ORDER — ZUBSOLV 0.7-0.18 MG SL SUBL
1.0000 | SUBLINGUAL_TABLET | Freq: Every day | SUBLINGUAL | 3 refills | Status: DC
  Filled 2024-07-11 – 2024-08-09 (×4): qty 30, 30d supply, fill #0

## 2024-07-11 MED ORDER — MELOXICAM 7.5 MG PO TABS
7.5000 mg | ORAL_TABLET | ORAL | 0 refills | Status: AC | PRN
  Filled 2024-07-11: qty 30, 60d supply, fill #0

## 2024-07-12 ENCOUNTER — Other Ambulatory Visit (HOSPITAL_COMMUNITY): Payer: Self-pay

## 2024-07-15 ENCOUNTER — Other Ambulatory Visit (HOSPITAL_COMMUNITY): Payer: Self-pay

## 2024-07-15 ENCOUNTER — Other Ambulatory Visit: Payer: Self-pay

## 2024-07-15 ENCOUNTER — Telehealth: Payer: Self-pay | Admitting: Orthopaedic Surgery

## 2024-07-15 DIAGNOSIS — M542 Cervicalgia: Secondary | ICD-10-CM

## 2024-07-15 DIAGNOSIS — G4733 Obstructive sleep apnea (adult) (pediatric): Secondary | ICD-10-CM | POA: Diagnosis not present

## 2024-07-15 NOTE — Telephone Encounter (Signed)
 Patient called and said his neck is severe pain. CB#[phone]

## 2024-07-16 ENCOUNTER — Other Ambulatory Visit (HOSPITAL_COMMUNITY): Payer: Self-pay

## 2024-07-16 ENCOUNTER — Encounter (HOSPITAL_COMMUNITY): Payer: Self-pay

## 2024-07-18 ENCOUNTER — Other Ambulatory Visit (HOSPITAL_COMMUNITY): Payer: Self-pay

## 2024-07-18 MED ORDER — OZEMPIC (0.25 OR 0.5 MG/DOSE) 2 MG/3ML ~~LOC~~ SOPN
0.5000 mg | PEN_INJECTOR | SUBCUTANEOUS | 0 refills | Status: AC
  Filled 2024-07-18: qty 3, 28d supply, fill #0

## 2024-07-19 ENCOUNTER — Encounter: Payer: Self-pay | Admitting: Orthopaedic Surgery

## 2024-07-19 ENCOUNTER — Ambulatory Visit
Admission: RE | Admit: 2024-07-19 | Discharge: 2024-07-19 | Disposition: A | Discharge status: Home / Self Care | Source: Ambulatory Visit | Attending: Orthopaedic Surgery | Admitting: Orthopaedic Surgery

## 2024-07-19 DIAGNOSIS — M4802 Spinal stenosis, cervical region: Secondary | ICD-10-CM | POA: Diagnosis not present

## 2024-07-19 DIAGNOSIS — M542 Cervicalgia: Secondary | ICD-10-CM

## 2024-07-19 DIAGNOSIS — M4722 Other spondylosis with radiculopathy, cervical region: Secondary | ICD-10-CM | POA: Diagnosis not present

## 2024-07-19 DIAGNOSIS — M50123 Cervical disc disorder at C6-C7 level with radiculopathy: Secondary | ICD-10-CM | POA: Diagnosis not present

## 2024-07-22 ENCOUNTER — Other Ambulatory Visit (INDEPENDENT_AMBULATORY_CARE_PROVIDER_SITE_OTHER): Payer: Self-pay

## 2024-07-22 ENCOUNTER — Encounter: Payer: Self-pay | Admitting: Orthopaedic Surgery

## 2024-07-22 ENCOUNTER — Ambulatory Visit (INDEPENDENT_AMBULATORY_CARE_PROVIDER_SITE_OTHER): Admitting: Orthopaedic Surgery

## 2024-07-22 DIAGNOSIS — Z96642 Presence of left artificial hip joint: Secondary | ICD-10-CM

## 2024-07-22 NOTE — Progress Notes (Signed)
 The patient is now almost 9 months status post a left total hip arthroplasty to treat severe left hip end-stage arthritis.  We actually replaced his right hip many years ago.  He says the hips are doing great.  He had a significant flexion contracture prior to surgery on the left hip.  He still been dealing with significant cervical spine pain with radicular components into the parascapular area more on the right than the left.  He did have a MRI of the cervical spine this past Friday but has not been read yet by the musculoskeletal radiologist who specialize in MRIs.  His left operative hip moves smoothly and fluidly as is his right hip.  His posture is better but he does have quite severe scoliosis.  An AP pelvis and lateral of the more recent left operative hip shows both of his hips and replacements are in good position with no complicating features.  Follow-up for the hips at this point can be as needed.  However once we know something about his cervical spine MRI we can go from there in terms of further recommendations.  As soon as we know something we can reach out to him.

## 2024-07-24 ENCOUNTER — Other Ambulatory Visit: Payer: Self-pay

## 2024-07-24 ENCOUNTER — Other Ambulatory Visit (HOSPITAL_COMMUNITY): Payer: Self-pay

## 2024-07-24 DIAGNOSIS — F324 Major depressive disorder, single episode, in partial remission: Secondary | ICD-10-CM | POA: Diagnosis not present

## 2024-07-24 DIAGNOSIS — E039 Hypothyroidism, unspecified: Secondary | ICD-10-CM | POA: Diagnosis not present

## 2024-07-24 DIAGNOSIS — I10 Essential (primary) hypertension: Secondary | ICD-10-CM | POA: Diagnosis not present

## 2024-07-24 DIAGNOSIS — E1165 Type 2 diabetes mellitus with hyperglycemia: Secondary | ICD-10-CM | POA: Diagnosis not present

## 2024-07-24 DIAGNOSIS — K219 Gastro-esophageal reflux disease without esophagitis: Secondary | ICD-10-CM | POA: Diagnosis not present

## 2024-07-24 DIAGNOSIS — G609 Hereditary and idiopathic neuropathy, unspecified: Secondary | ICD-10-CM | POA: Diagnosis not present

## 2024-07-24 DIAGNOSIS — G894 Chronic pain syndrome: Secondary | ICD-10-CM | POA: Diagnosis not present

## 2024-07-24 DIAGNOSIS — E291 Testicular hypofunction: Secondary | ICD-10-CM | POA: Diagnosis not present

## 2024-07-24 DIAGNOSIS — E782 Mixed hyperlipidemia: Secondary | ICD-10-CM | POA: Diagnosis not present

## 2024-07-24 DIAGNOSIS — F419 Anxiety disorder, unspecified: Secondary | ICD-10-CM | POA: Diagnosis not present

## 2024-07-24 MED ORDER — ALPRAZOLAM 1 MG PO TABS
2.0000 mg | ORAL_TABLET | Freq: Every day | ORAL | 3 refills | Status: DC | PRN
  Filled 2024-07-24: qty 60, 30d supply, fill #0
  Filled 2024-09-02: qty 60, 30d supply, fill #1
  Filled 2024-10-14: qty 60, 30d supply, fill #2
  Filled 2024-11-24: qty 60, 30d supply, fill #3

## 2024-07-25 ENCOUNTER — Other Ambulatory Visit (HOSPITAL_COMMUNITY): Payer: Self-pay

## 2024-07-25 DIAGNOSIS — E875 Hyperkalemia: Secondary | ICD-10-CM | POA: Diagnosis not present

## 2024-07-25 MED ORDER — FUROSEMIDE 20 MG PO TABS
20.0000 mg | ORAL_TABLET | Freq: Every day | ORAL | 0 refills | Status: DC
  Filled 2024-07-25: qty 7, 7d supply, fill #0

## 2024-07-31 ENCOUNTER — Other Ambulatory Visit: Payer: Self-pay

## 2024-07-31 ENCOUNTER — Ambulatory Visit: Payer: 59 | Admitting: Orthopaedic Surgery

## 2024-07-31 ENCOUNTER — Other Ambulatory Visit (HOSPITAL_COMMUNITY): Payer: Self-pay

## 2024-08-01 ENCOUNTER — Encounter: Payer: Self-pay | Admitting: Behavioral Health

## 2024-08-01 ENCOUNTER — Other Ambulatory Visit (HOSPITAL_COMMUNITY): Payer: Self-pay

## 2024-08-01 ENCOUNTER — Ambulatory Visit: Admitting: Behavioral Health

## 2024-08-01 DIAGNOSIS — E875 Hyperkalemia: Secondary | ICD-10-CM | POA: Diagnosis not present

## 2024-08-01 MED ORDER — AMLODIPINE BESYLATE 5 MG PO TABS
5.0000 mg | ORAL_TABLET | Freq: Every day | ORAL | 5 refills | Status: AC
  Filled 2024-08-01: qty 30, 30d supply, fill #0
  Filled 2024-08-30: qty 30, 30d supply, fill #1
  Filled 2024-09-26: qty 30, 30d supply, fill #2
  Filled 2024-10-26: qty 30, 30d supply, fill #3
  Filled 2024-12-02: qty 30, 30d supply, fill #4
  Filled 2024-12-29: qty 30, 30d supply, fill #5

## 2024-08-01 NOTE — Progress Notes (Signed)
 Pt is normally present for appt. No charge this time.

## 2024-08-02 ENCOUNTER — Other Ambulatory Visit (HOSPITAL_COMMUNITY): Payer: Self-pay

## 2024-08-02 MED ORDER — FUROSEMIDE 20 MG PO TABS
20.0000 mg | ORAL_TABLET | Freq: Every day | ORAL | 0 refills | Status: DC
  Filled 2024-08-02: qty 7, 7d supply, fill #0

## 2024-08-05 ENCOUNTER — Telehealth: Payer: Self-pay | Admitting: Orthopaedic Surgery

## 2024-08-05 NOTE — Telephone Encounter (Signed)
 Patient called and said he is waiting on you to call him to go over the results for the MRI. He didn't want to make the appointment til he heard from you. CB#[phone]

## 2024-08-07 ENCOUNTER — Ambulatory Visit (INDEPENDENT_AMBULATORY_CARE_PROVIDER_SITE_OTHER): Admitting: Behavioral Health

## 2024-08-07 ENCOUNTER — Other Ambulatory Visit: Payer: Self-pay | Admitting: Behavioral Health

## 2024-08-07 ENCOUNTER — Other Ambulatory Visit: Payer: Self-pay

## 2024-08-07 ENCOUNTER — Encounter: Payer: Self-pay | Admitting: Behavioral Health

## 2024-08-07 ENCOUNTER — Other Ambulatory Visit (HOSPITAL_COMMUNITY): Payer: Self-pay

## 2024-08-07 DIAGNOSIS — G4700 Insomnia, unspecified: Secondary | ICD-10-CM | POA: Diagnosis not present

## 2024-08-07 DIAGNOSIS — F332 Major depressive disorder, recurrent severe without psychotic features: Secondary | ICD-10-CM | POA: Diagnosis not present

## 2024-08-07 DIAGNOSIS — F331 Major depressive disorder, recurrent, moderate: Secondary | ICD-10-CM | POA: Diagnosis not present

## 2024-08-07 DIAGNOSIS — F411 Generalized anxiety disorder: Secondary | ICD-10-CM | POA: Diagnosis not present

## 2024-08-07 DIAGNOSIS — M542 Cervicalgia: Secondary | ICD-10-CM

## 2024-08-07 MED ORDER — VILAZODONE HCL 40 MG PO TABS
40.0000 mg | ORAL_TABLET | Freq: Every day | ORAL | 1 refills | Status: DC
  Filled 2024-08-07: qty 90, 90d supply, fill #0

## 2024-08-07 MED ORDER — CARIPRAZINE HCL 1.5 MG PO CAPS
1.5000 mg | ORAL_CAPSULE | Freq: Every day | ORAL | 2 refills | Status: DC
  Filled 2024-08-07: qty 30, 30d supply, fill #0
  Filled 2024-09-06: qty 30, 30d supply, fill #1
  Filled 2024-10-08: qty 30, 30d supply, fill #2

## 2024-08-07 MED ORDER — BUPROPION HCL ER (XL) 300 MG PO TB24
300.0000 mg | ORAL_TABLET | Freq: Every morning | ORAL | 1 refills | Status: DC
  Filled 2024-08-07: qty 90, 90d supply, fill #0

## 2024-08-07 NOTE — Progress Notes (Signed)
 [facility]  Patient ID: NIAL HAWE,  MRN: [id number]  PCP: Arloa Elsie SAUNDERS, MD  Date of Evaluation: 08/07/2024 Time spent:30 minutes  Chief Complaint:  Chief Complaint   Depression; Anxiety; Follow-up; Medication Refill; Patient Education; Stress     HISTORY/CURRENT STATUS: HPI 28, 53 year old male presents to this office for initial visit and to establish care.  Collateral information should be considered reliable.  Patient is very gracious and cooperative.  So far reporting significant improvement with anxiety, depression and irritability.  Has had some recent breakthrough depression the last week but relates it to health concerns.  Feels that Vraylar  so far is working very well. No undesirable side effects.  He reports depression today 4/10, and anxiety at 3/10. Eating well.  He agrees to stay on course with current medication regimen .He denies history of mania, no psychosis, no auditory or visual hallucinations, or delirium.  Denies SI or HI.  Says that he feels safe and verbally contracts for safety with this Clinical research associate.  Says that he fears death and would never harm self.  Acknowledges strong family support from spouse.    Past psychiatric medication trial: Lexapro Prozac Viibryd  Wellbutrin      Individual Medical History/ Review of Systems: Changes? :No   Allergies: Cephalexin, Lexapro [escitalopram oxalate], and Victoza [liraglutide]  Current Medications:  Current Outpatient Medications:    ALPRAZolam  (XANAX ) 1 MG tablet, Take 2 tablets (2 mg total) by mouth daily as needed., Disp: 60 tablet, Rfl: 3   ALPRAZolam  (XANAX ) 1 MG tablet, Take up to 2 tablets (2 mg total) by mouth daily as needed., Disp: 60 tablet, Rfl: 3   amLODipine  (NORVASC ) 5 MG tablet, Take 1 tablet (5 mg total) by mouth daily., Disp: 30 tablet, Rfl: 5   atorvastatin  (LIPITOR) 40 MG tablet, Take 1 tablet (40 mg total) by mouth daily., Disp: 90 tablet, Rfl: 2   atorvastatin  (LIPITOR) 40  MG tablet, Take 1 tablet (40 mg total) by mouth daily., Disp: 90 tablet, Rfl: 0   azelastine  (ASTELIN ) 0.1 % nasal spray, Place 1 spray into both nostrils 2 (two) times daily when pollen count is high., Disp: 30 mL, Rfl: 0   Buprenorphine  HCl-Naloxone  HCl (ZUBSOLV ) 0.7-0.18 MG SUBL, Place 1 tablet onto the tongue daily., Disp: 30 tablet, Rfl: 3   Buprenorphine  HCl-Naloxone  HCl (ZUBSOLV ) 0.7-0.18 MG SUBL, Place 1 tablet under the tongue daily., Disp: 30 tablet, Rfl: 3   Buprenorphine  HCl-Naloxone  HCl (ZUBSOLV ) 0.7-0.18 MG SUBL, Place 1 tablet under the tongue daily., Disp: 30 tablet, Rfl: 3   buPROPion  (WELLBUTRIN  XL) 300 MG 24 hr tablet, Take 1 tablet (300 mg total) by mouth in the morning., Disp: 90 tablet, Rfl: 1   cariprazine  (VRAYLAR ) 1.5 MG capsule, Take 1 capsule (1.5 mg total) by mouth daily., Disp: 30 capsule, Rfl: 2   furosemide  (LASIX ) 20 MG tablet, Take 1 tablet (20 mg total) by mouth daily., Disp: 7 tablet, Rfl: 0   gabapentin  (NEURONTIN ) 600 MG tablet, Take 1 tablet (600 mg total) by mouth 4 (four) times daily., Disp: 360 tablet, Rfl: 1   levothyroxine  (SYNTHROID ) 25 MCG tablet, Take 1 tablet orally in the morning on an empty stomach., Disp: 90 tablet, Rfl: 3   losartan  (COZAAR ) 100 MG tablet, Take 1 tablet (100 mg total) by mouth daily., Disp: 30 tablet, Rfl: 5   losartan  (COZAAR ) 50 MG tablet, Take 1 tablet (50 mg total) by mouth daily., Disp: 90 tablet, Rfl: 1   meloxicam  (MOBIC ) 7.5 MG tablet,  Take 1 tablet (7.5 mg total) by mouth daily., Disp: 30 tablet, Rfl: 0   meloxicam  (MOBIC ) 7.5 MG tablet, Take 1 tablet (7.5 mg total) by mouth every other day as needed for pain refractory to other interventions., Disp: 30 tablet, Rfl: 0   metFORMIN  (GLUCOPHAGE -XR) 500 MG 24 hr tablet, Take 2 tablets (1,000 mg total) by mouth 2 (two) times daily., Disp: 360 tablet, Rfl: 0   metoprolol  succinate (TOPROL -XL) 100 MG 24 hr tablet, Take 1 tablet (100 mg total) by mouth daily., Disp: 90 tablet, Rfl:  0   ondansetron  (ZOFRAN ) 8 MG tablet, Take 1 tablet (8 mg total) by mouth 3 (three) times daily as needed for nausea for 10 days, Disp: 30 tablet, Rfl: 1   pantoprazole  (PROTONIX ) 40 MG tablet, Take 1 tablet (40 mg total) by mouth 1/2 to 1 hour before morning meal for indigestion, Disp: 30 tablet, Rfl: 0   Semaglutide ,0.25 or 0.5MG /DOS, (OZEMPIC , 0.25 OR 0.5 MG/DOSE,) 2 MG/3ML SOPN, Inject 0.25 mg into the skin once a week., Disp: 3 mL, Rfl: 5   Semaglutide ,0.25 or 0.5MG /DOS, (OZEMPIC , 0.25 OR 0.5 MG/DOSE,) 2 MG/3ML SOPN, Inject 0.5 mg into the skin once a week., Disp: 3 mL, Rfl: 0   tadalafil (CIALIS) 20 MG tablet, Take 20 mg by mouth daily as needed for erectile dysfunction., Disp: , Rfl:    testosterone  cypionate (DEPOTESTOSTERONE CYPIONATE) 200 MG/ML injection, Inject 200 mg into the skin every 30 (thirty) days., Disp: , Rfl:    tiZANidine  (ZANAFLEX ) 4 MG tablet, Take 1 tablet (4 mg total) by mouth every 6 (six) hours as needed for muscle spasms., Disp: 60 tablet, Rfl: 1   Vilazodone  HCl (VIIBRYD ) 40 MG TABS, Take 1 tablet (40 mg total) by mouth daily with food., Disp: 90 tablet, Rfl: 1 Medication Side Effects: none  Family Medical/ Social History: Changes? No  MENTAL HEALTH EXAM:  There were no vitals taken for this visit.There is no height or weight on file to calculate BMI.  General Appearance: Casual and Neat  Eye Contact:  Good  Speech:  Clear and Coherent  Volume:  Normal  Mood:  NA  Affect:  Appropriate  Thought Process:  Coherent  Orientation:  Full (Time, Place, and Person)  Thought Content: Logical   Suicidal Thoughts:  No  Homicidal Thoughts:  No  Memory:  WNL  Judgement:  Good  Insight:  Good  Psychomotor Activity:  Normal  Concentration:  Concentration: Good  Recall:  Good  Fund of Knowledge: Good  Language: Good  Assets:  Desire for Improvement  ADL's:  Intact  Cognition: WNL  Prognosis:  Good    DIAGNOSES:    ICD-10-CM   1. Major depressive disorder,  recurrent episode, moderate (HCC)  F33.1     2. Insomnia, unspecified type  G47.00     3. Generalized anxiety disorder  F41.1     4. Severe episode of recurrent major depressive disorder, without psychotic features (HCC)  F33.2 buPROPion  (WELLBUTRIN  XL) 300 MG 24 hr tablet    Vilazodone  HCl (VIIBRYD ) 40 MG TABS    cariprazine  (VRAYLAR ) 1.5 MG capsule      Receiving Psychotherapy: No    RECOMMENDATIONS:   Greater than 50% of 30 min face to face time with patient was spent on counseling and coordination of care. We discussed his improvement in anxiety and depression since starting Vralar. Some recent situational depression but does not warrant medication changes this visit. For now,  happy with psychiatric medication. Requesting no  medication adjustments this visit.    We agreed today to:   Will continue Viibryd  40 mg daily after breakfast. Will continue Wellbutrin  300 mg daily To continue  Vraylar  1.5 mg daily in the a.m.   Will report worsening symptoms or side effects promptly Will follow-up in 8  weeks to reassess Provided emergency contact information Discussed potential metabolic side effects associated with atypical antipsychotics, as well as potential risk for movement side effects. Advised pt to contact office if movement side effects occur.  He Discussed potential benefits, risk, and side effects of benzodiazepines to include potential risk of tolerance and dependence, as well as possible drowsiness.  Advised patient not to drive if experiencing drowsiness and to take lowest possible effective dose to minimize risk of dependence and tolerance.  Reviewed PDMP         Redell DELENA Pizza, NP

## 2024-08-08 ENCOUNTER — Other Ambulatory Visit (HOSPITAL_COMMUNITY): Payer: Self-pay

## 2024-08-08 DIAGNOSIS — E291 Testicular hypofunction: Secondary | ICD-10-CM | POA: Diagnosis not present

## 2024-08-08 DIAGNOSIS — E875 Hyperkalemia: Secondary | ICD-10-CM | POA: Diagnosis not present

## 2024-08-09 ENCOUNTER — Other Ambulatory Visit (HOSPITAL_COMMUNITY): Payer: Self-pay

## 2024-08-14 DIAGNOSIS — F1120 Opioid dependence, uncomplicated: Secondary | ICD-10-CM | POA: Diagnosis not present

## 2024-08-14 DIAGNOSIS — N1832 Chronic kidney disease, stage 3b: Secondary | ICD-10-CM | POA: Diagnosis not present

## 2024-08-16 DIAGNOSIS — Z79899 Other long term (current) drug therapy: Secondary | ICD-10-CM | POA: Diagnosis not present

## 2024-08-21 ENCOUNTER — Other Ambulatory Visit: Payer: Self-pay

## 2024-08-22 ENCOUNTER — Other Ambulatory Visit (HOSPITAL_COMMUNITY): Payer: Self-pay

## 2024-08-22 MED ORDER — OZEMPIC (0.25 OR 0.5 MG/DOSE) 2 MG/3ML ~~LOC~~ SOPN
0.5000 mg | PEN_INJECTOR | SUBCUTANEOUS | 0 refills | Status: AC
  Filled 2024-08-22: qty 3, 28d supply, fill #0

## 2024-08-29 ENCOUNTER — Other Ambulatory Visit (HOSPITAL_COMMUNITY): Payer: Self-pay

## 2024-08-29 MED ORDER — OZEMPIC (0.25 OR 0.5 MG/DOSE) 2 MG/3ML ~~LOC~~ SOPN
0.5000 mg | PEN_INJECTOR | SUBCUTANEOUS | 0 refills | Status: DC
  Filled 2024-09-30 (×2): qty 3, 28d supply, fill #0

## 2024-08-30 ENCOUNTER — Other Ambulatory Visit (HOSPITAL_COMMUNITY): Payer: Self-pay

## 2024-08-30 ENCOUNTER — Other Ambulatory Visit: Payer: Self-pay

## 2024-09-02 ENCOUNTER — Other Ambulatory Visit (HOSPITAL_COMMUNITY): Payer: Self-pay

## 2024-09-02 DIAGNOSIS — M79671 Pain in right foot: Secondary | ICD-10-CM | POA: Diagnosis not present

## 2024-09-02 DIAGNOSIS — R0602 Shortness of breath: Secondary | ICD-10-CM | POA: Diagnosis not present

## 2024-09-02 DIAGNOSIS — M533 Sacrococcygeal disorders, not elsewhere classified: Secondary | ICD-10-CM | POA: Diagnosis not present

## 2024-09-02 DIAGNOSIS — M2559 Pain in other specified joint: Secondary | ICD-10-CM | POA: Diagnosis not present

## 2024-09-02 DIAGNOSIS — E291 Testicular hypofunction: Secondary | ICD-10-CM | POA: Diagnosis not present

## 2024-09-02 DIAGNOSIS — Z6828 Body mass index (BMI) 28.0-28.9, adult: Secondary | ICD-10-CM | POA: Diagnosis not present

## 2024-09-02 DIAGNOSIS — M545 Low back pain, unspecified: Secondary | ICD-10-CM | POA: Diagnosis not present

## 2024-09-02 DIAGNOSIS — M7918 Myalgia, other site: Secondary | ICD-10-CM | POA: Diagnosis not present

## 2024-09-02 DIAGNOSIS — R5383 Other fatigue: Secondary | ICD-10-CM | POA: Diagnosis not present

## 2024-09-02 DIAGNOSIS — R809 Proteinuria, unspecified: Secondary | ICD-10-CM | POA: Diagnosis not present

## 2024-09-02 DIAGNOSIS — Z23 Encounter for immunization: Secondary | ICD-10-CM | POA: Diagnosis not present

## 2024-09-02 DIAGNOSIS — M791 Myalgia, unspecified site: Secondary | ICD-10-CM | POA: Diagnosis not present

## 2024-09-02 DIAGNOSIS — I739 Peripheral vascular disease, unspecified: Secondary | ICD-10-CM | POA: Diagnosis not present

## 2024-09-02 DIAGNOSIS — E663 Overweight: Secondary | ICD-10-CM | POA: Diagnosis not present

## 2024-09-02 LAB — COMPREHENSIVE METABOLIC PANEL WITH GFR: EGFR: 42

## 2024-09-02 MED ORDER — PREDNISONE 5 MG PO TABS
ORAL_TABLET | ORAL | 0 refills | Status: AC
  Filled 2024-09-02: qty 30, 12d supply, fill #0

## 2024-09-03 ENCOUNTER — Other Ambulatory Visit: Payer: Self-pay | Admitting: Family Medicine

## 2024-09-03 ENCOUNTER — Other Ambulatory Visit: Payer: Self-pay

## 2024-09-03 ENCOUNTER — Ambulatory Visit (INDEPENDENT_AMBULATORY_CARE_PROVIDER_SITE_OTHER): Admitting: Physical Medicine and Rehabilitation

## 2024-09-03 VITALS — BP 131/82 | HR 70

## 2024-09-03 DIAGNOSIS — I739 Peripheral vascular disease, unspecified: Secondary | ICD-10-CM

## 2024-09-03 DIAGNOSIS — M5412 Radiculopathy, cervical region: Secondary | ICD-10-CM

## 2024-09-03 MED ORDER — METHYLPREDNISOLONE ACETATE 80 MG/ML IJ SUSP
40.0000 mg | Freq: Once | INTRAMUSCULAR | Status: AC
  Administered 2024-09-03: 40 mg

## 2024-09-03 NOTE — Progress Notes (Signed)
 Pain Scale   Average Pain 3 Patient advising he has chronic neck to right shoulder pain that increases when he turns his head.     +Driver, -BT, -Dye Allergies.

## 2024-09-05 DIAGNOSIS — E875 Hyperkalemia: Secondary | ICD-10-CM | POA: Diagnosis not present

## 2024-09-05 LAB — COMPREHENSIVE METABOLIC PANEL WITH GFR: EGFR: 56

## 2024-09-06 ENCOUNTER — Other Ambulatory Visit (HOSPITAL_COMMUNITY): Payer: Self-pay

## 2024-09-06 DIAGNOSIS — F1120 Opioid dependence, uncomplicated: Secondary | ICD-10-CM | POA: Diagnosis not present

## 2024-09-06 DIAGNOSIS — Z79891 Long term (current) use of opiate analgesic: Secondary | ICD-10-CM | POA: Diagnosis not present

## 2024-09-06 MED ORDER — ZUBSOLV 0.7-0.18 MG SL SUBL
1.0000 | SUBLINGUAL_TABLET | Freq: Every day | SUBLINGUAL | 3 refills | Status: DC
  Filled 2024-09-06 – 2024-09-09 (×2): qty 30, 30d supply, fill #0

## 2024-09-06 MED ORDER — FUROSEMIDE 20 MG PO TABS
20.0000 mg | ORAL_TABLET | Freq: Two times a day (BID) | ORAL | 0 refills | Status: DC
  Filled 2024-09-06: qty 14, 7d supply, fill #0

## 2024-09-09 ENCOUNTER — Other Ambulatory Visit (HOSPITAL_COMMUNITY): Payer: Self-pay

## 2024-09-09 DIAGNOSIS — R0602 Shortness of breath: Secondary | ICD-10-CM | POA: Diagnosis not present

## 2024-09-10 ENCOUNTER — Other Ambulatory Visit

## 2024-09-10 NOTE — Progress Notes (Signed)
 Brandon Gordon - 53 y.o. male MRN [id number]  Date of birth: 1971-04-04  Office Visit Note: Visit Date: 09/03/2024 PCP: Arloa Elsie SAUNDERS, MD Referred by: Arloa Elsie SAUNDERS, MD  Subjective: Chief Complaint  Patient presents with   Neck - Pain   HPI:  Brandon Gordon is a 53 y.o. male who comes in today at the request of Dr. Lonni Poli for planned Left C7-T1 Cervical Interlaminar epidural steroid injection with fluoroscopic guidance.  The patient has failed conservative care including home exercise, medications, time and activity modification.  This injection will be diagnostic and hopefully therapeutic.  Please see requesting physician notes for further details and justification.   ROS Otherwise per HPI.  Assessment & Plan: Visit Diagnoses:    ICD-10-CM   1. Cervical radiculopathy  M54.12 XR C-ARM NO REPORT    Epidural Steroid injection    methylPREDNISolone  acetate (DEPO-MEDROL ) injection 40 mg      Plan: No additional findings.   Meds & Orders:  Meds ordered this encounter  Medications   methylPREDNISolone  acetate (DEPO-MEDROL ) injection 40 mg    Orders Placed This Encounter  Procedures   XR C-ARM NO REPORT   Epidural Steroid injection    Follow-up: Return for visit to requesting provider as needed.   Procedures: No procedures performed  Cervical Epidural Steroid Injection - Interlaminar Approach with Fluoroscopic Guidance  Patient: Brandon Gordon      Date of Birth: [birth year] MRN: [id number] PCP: Arloa Elsie SAUNDERS, MD      Visit Date: 09/03/2024   Universal Protocol:    Date/Time: 09/23/252:08 PM  Consent Given By: the patient  Position: PRONE  Additional Comments: Vital signs were monitored before and after the procedure. Patient was prepped and draped in the usual sterile fashion. The correct patient, procedure, and site was verified.   Injection Procedure Details:   Procedure diagnoses: Cervical radiculopathy [M54.12]    Meds Administered:   Meds ordered this encounter  Medications   methylPREDNISolone  acetate (DEPO-MEDROL ) injection 40 mg     Laterality: Left  Location/Site: C7-T1  Needle: 3.5 in., 20 ga. Tuohy  Needle Placement: Paramedian epidural space  Findings:  -Comments: Excellent flow of contrast into the epidural space.  Procedure Details: Using a paramedian approach from the side mentioned above, the region overlying the inferior lamina was localized under fluoroscopic visualization and the soft tissues overlying this structure were infiltrated with 4 ml. of 1% Lidocaine  without Epinephrine. A # 20 gauge, Tuohy needle was inserted into the epidural space using a paramedian approach.  The epidural space was localized using loss of resistance along with contralateral oblique bi-planar fluoroscopic views.  After negative aspirate for air, blood, and CSF, a 2 ml. volume of Isovue-250 was injected into the epidural space and the flow of contrast was observed. Radiographs were obtained for documentation purposes.   The injectate was administered into the level noted above.  Additional Comments:  The patient tolerated the procedure well Dressing: 2 x 2 sterile gauze and Band-Aid    Post-procedure details: Patient was observed during the procedure. Post-procedure instructions were reviewed.  Patient left the clinic in stable condition.   Clinical History: MRI CERVICAL SPINE WITHOUT CONTRAST 07/19/2024 04:08:40 PM   TECHNIQUE: Multiplanar multisequence MRI of the cervical spine was performed without the administration of intravenous contrast.   COMPARISON: None available.   CLINICAL HISTORY: Cervical radiculopathy, no red flags; Neck pain, chronic. Pt has neck pain for 3 months; No injections, no surgery; Pt states ?  Pain during exercise; Pt had neck xray done 05/23/24 in PACS; Due to pt position in the scanner, some motion artifact. Images were repeated as tolerated.   FINDINGS:   BONES AND  ALIGNMENT: Normal alignment. Normal vertebral body heights. Marrow signal is unremarkable. No abnormal enhancement.   SPINAL CORD: Normal spinal cord size. Normal spinal cord signal.   SOFT TISSUES: No paraspinal mass.   C2-C3: No significant disc herniation. No spinal canal stenosis or neural foraminal narrowing.   C3-C4: No significant disc herniation. No spinal canal stenosis or neural foraminal narrowing.   C4-C5: Right facet hypertrophy and minimal disc bulge with moderate right and mild left foraminal stenosis. Bilateral moderate foraminal stenosis.   C5-C6: Mild facet hypertrophy with moderate left foraminal stenosis.   C6-C7: Small disc bulge with mild facet hypertrophy and moderate bilateral foraminal stenosis. Small central disc protrusion without stenosis.   C7-T1: No significant disc herniation. No spinal canal stenosis or neural foraminal narrowing.   IMPRESSION: 1. C4-5 moderate bilateral foraminal stenosis. 2. C5-6 moderate left foraminal stenosis. 3. C6-7 moderate bilateral foraminal stenosis.   Electronically signed by: Franky Stanford MD 07/26/2024 02:10 PM EDT RP Workstation: [id number]     Objective:  VS:  HT:    WT:   BMI:     BP:131/82  HR:70bpm  TEMP: ( )  RESP:  Physical Exam Vitals and nursing note reviewed.  Constitutional:      General: He is not in acute distress.    Appearance: Normal appearance. He is not ill-appearing.  HENT:     Head: Normocephalic and atraumatic.     Right Ear: External ear normal.     Left Ear: External ear normal.  Eyes:     Extraocular Movements: Extraocular movements intact.  Cardiovascular:     Rate and Rhythm: Normal rate.     Pulses: Normal pulses.  Abdominal:     General: There is no distension.     Palpations: Abdomen is soft.  Musculoskeletal:        General: No signs of injury.     Cervical back: Neck supple. Tenderness present. No rigidity.     Right lower leg: No edema.     Left lower  leg: No edema.     Comments: Patient has good strength in the upper extremities with 5 out of 5 strength in wrist extension long finger flexion APB.  No intrinsic hand muscle atrophy.  Negative Hoffmann's test.  Lymphadenopathy:     Cervical: No cervical adenopathy.  Skin:    Findings: No erythema or rash.  Neurological:     General: No focal deficit present.     Mental Status: He is alert and oriented to person, place, and time.     Sensory: No sensory deficit.     Motor: No weakness or abnormal muscle tone.     Coordination: Coordination normal.  Psychiatric:        Mood and Affect: Mood normal.        Behavior: Behavior normal.      Imaging: No results found.

## 2024-09-10 NOTE — Procedures (Signed)
 Cervical Epidural Steroid Injection - Interlaminar Approach with Fluoroscopic Guidance  Patient: Brandon Gordon      Date of Birth: Oct 09, 1971 MRN: [id number] PCP: Arloa Elsie SAUNDERS, MD      Visit Date: 09/03/2024   Universal Protocol:    Date/Time: 09/23/252:08 PM  Consent Given By: the patient  Position: PRONE  Additional Comments: Vital signs were monitored before and after the procedure. Patient was prepped and draped in the usual sterile fashion. The correct patient, procedure, and site was verified.   Injection Procedure Details:   Procedure diagnoses: Cervical radiculopathy [M54.12]    Meds Administered:  Meds ordered this encounter  Medications   methylPREDNISolone  acetate (DEPO-MEDROL ) injection 40 mg     Laterality: Left  Location/Site: C7-T1  Needle: 3.5 in., 20 ga. Tuohy  Needle Placement: Paramedian epidural space  Findings:  -Comments: Excellent flow of contrast into the epidural space.  Procedure Details: Using a paramedian approach from the side mentioned above, the region overlying the inferior lamina was localized under fluoroscopic visualization and the soft tissues overlying this structure were infiltrated with 4 ml. of 1% Lidocaine  without Epinephrine. A # 20 gauge, Tuohy needle was inserted into the epidural space using a paramedian approach.  The epidural space was localized using loss of resistance along with contralateral oblique bi-planar fluoroscopic views.  After negative aspirate for air, blood, and CSF, a 2 ml. volume of Isovue-250 was injected into the epidural space and the flow of contrast was observed. Radiographs were obtained for documentation purposes.   The injectate was administered into the level noted above.  Additional Comments:  The patient tolerated the procedure well Dressing: 2 x 2 sterile gauze and Band-Aid    Post-procedure details: Patient was observed during the procedure. Post-procedure instructions were  reviewed.  Patient left the clinic in stable condition.

## 2024-09-11 ENCOUNTER — Ambulatory Visit
Admission: RE | Admit: 2024-09-11 | Discharge: 2024-09-11 | Disposition: A | Discharge status: Home / Self Care | Source: Ambulatory Visit | Attending: Family Medicine | Admitting: Family Medicine

## 2024-09-11 DIAGNOSIS — I739 Peripheral vascular disease, unspecified: Secondary | ICD-10-CM

## 2024-09-13 DIAGNOSIS — E875 Hyperkalemia: Secondary | ICD-10-CM | POA: Diagnosis not present

## 2024-09-13 LAB — COMPREHENSIVE METABOLIC PANEL WITH GFR: EGFR: 43

## 2024-09-16 DIAGNOSIS — M79671 Pain in right foot: Secondary | ICD-10-CM | POA: Diagnosis not present

## 2024-09-16 DIAGNOSIS — E663 Overweight: Secondary | ICD-10-CM | POA: Diagnosis not present

## 2024-09-16 DIAGNOSIS — M4185 Other forms of scoliosis, thoracolumbar region: Secondary | ICD-10-CM | POA: Diagnosis not present

## 2024-09-16 DIAGNOSIS — M7918 Myalgia, other site: Secondary | ICD-10-CM | POA: Diagnosis not present

## 2024-09-16 DIAGNOSIS — R5383 Other fatigue: Secondary | ICD-10-CM | POA: Diagnosis not present

## 2024-09-16 DIAGNOSIS — Z6828 Body mass index (BMI) 28.0-28.9, adult: Secondary | ICD-10-CM | POA: Diagnosis not present

## 2024-09-16 DIAGNOSIS — M533 Sacrococcygeal disorders, not elsewhere classified: Secondary | ICD-10-CM | POA: Diagnosis not present

## 2024-09-16 DIAGNOSIS — M545 Low back pain, unspecified: Secondary | ICD-10-CM | POA: Diagnosis not present

## 2024-09-17 DIAGNOSIS — M159 Polyosteoarthritis, unspecified: Secondary | ICD-10-CM | POA: Diagnosis not present

## 2024-09-17 DIAGNOSIS — E875 Hyperkalemia: Secondary | ICD-10-CM | POA: Diagnosis not present

## 2024-09-18 ENCOUNTER — Other Ambulatory Visit (HOSPITAL_COMMUNITY): Payer: Self-pay

## 2024-09-19 ENCOUNTER — Other Ambulatory Visit (HOSPITAL_COMMUNITY): Payer: Self-pay

## 2024-09-19 ENCOUNTER — Other Ambulatory Visit: Payer: Self-pay | Admitting: Rheumatology

## 2024-09-19 ENCOUNTER — Encounter: Payer: Self-pay | Admitting: Rheumatology

## 2024-09-19 DIAGNOSIS — M545 Low back pain, unspecified: Secondary | ICD-10-CM

## 2024-09-19 DIAGNOSIS — M533 Sacrococcygeal disorders, not elsewhere classified: Secondary | ICD-10-CM

## 2024-09-19 MED ORDER — FUROSEMIDE 20 MG PO TABS
20.0000 mg | ORAL_TABLET | Freq: Two times a day (BID) | ORAL | 0 refills | Status: DC
  Filled 2024-09-19: qty 60, 30d supply, fill #0

## 2024-09-20 ENCOUNTER — Other Ambulatory Visit (HOSPITAL_COMMUNITY): Payer: Self-pay

## 2024-09-20 ENCOUNTER — Encounter: Payer: Self-pay | Admitting: Rheumatology

## 2024-09-20 MED ORDER — METOPROLOL SUCCINATE ER 100 MG PO TB24
100.0000 mg | ORAL_TABLET | Freq: Every day | ORAL | 0 refills | Status: DC
  Filled 2024-09-20: qty 90, 90d supply, fill #0

## 2024-09-24 DIAGNOSIS — E291 Testicular hypofunction: Secondary | ICD-10-CM | POA: Diagnosis not present

## 2024-09-26 ENCOUNTER — Ambulatory Visit
Admission: RE | Admit: 2024-09-26 | Discharge: 2024-09-26 | Disposition: A | Discharge status: Home / Self Care | Source: Ambulatory Visit | Attending: Rheumatology | Admitting: Rheumatology

## 2024-09-26 ENCOUNTER — Other Ambulatory Visit (HOSPITAL_COMMUNITY): Payer: Self-pay

## 2024-09-26 ENCOUNTER — Other Ambulatory Visit: Payer: Self-pay

## 2024-09-26 DIAGNOSIS — M545 Low back pain, unspecified: Secondary | ICD-10-CM | POA: Diagnosis not present

## 2024-09-26 DIAGNOSIS — M533 Sacrococcygeal disorders, not elsewhere classified: Secondary | ICD-10-CM

## 2024-09-26 DIAGNOSIS — M47814 Spondylosis without myelopathy or radiculopathy, thoracic region: Secondary | ICD-10-CM | POA: Diagnosis not present

## 2024-09-26 DIAGNOSIS — M4317 Spondylolisthesis, lumbosacral region: Secondary | ICD-10-CM | POA: Diagnosis not present

## 2024-09-26 DIAGNOSIS — R102 Pelvic and perineal pain unspecified side: Secondary | ICD-10-CM | POA: Diagnosis not present

## 2024-09-27 ENCOUNTER — Other Ambulatory Visit (HOSPITAL_COMMUNITY): Payer: Self-pay

## 2024-09-27 MED ORDER — ATORVASTATIN CALCIUM 40 MG PO TABS
40.0000 mg | ORAL_TABLET | Freq: Every day | ORAL | 3 refills | Status: DC
  Filled 2024-09-27: qty 90, 90d supply, fill #0

## 2024-09-30 ENCOUNTER — Other Ambulatory Visit (HOSPITAL_COMMUNITY): Payer: Self-pay

## 2024-10-01 ENCOUNTER — Other Ambulatory Visit (HOSPITAL_COMMUNITY): Payer: Self-pay

## 2024-10-01 DIAGNOSIS — F1120 Opioid dependence, uncomplicated: Secondary | ICD-10-CM | POA: Diagnosis not present

## 2024-10-01 MED ORDER — ZUBSOLV 0.7-0.18 MG SL SUBL
1.0000 | SUBLINGUAL_TABLET | Freq: Every day | SUBLINGUAL | 3 refills | Status: DC
  Filled 2024-10-09: qty 30, 30d supply, fill #0

## 2024-10-02 ENCOUNTER — Other Ambulatory Visit: Payer: Self-pay | Admitting: Family Medicine

## 2024-10-02 DIAGNOSIS — R9389 Abnormal findings on diagnostic imaging of other specified body structures: Secondary | ICD-10-CM

## 2024-10-02 DIAGNOSIS — Z79899 Other long term (current) drug therapy: Secondary | ICD-10-CM | POA: Diagnosis not present

## 2024-10-08 ENCOUNTER — Other Ambulatory Visit (HOSPITAL_COMMUNITY): Payer: Self-pay

## 2024-10-09 ENCOUNTER — Other Ambulatory Visit (HOSPITAL_COMMUNITY): Payer: Self-pay

## 2024-10-09 ENCOUNTER — Encounter: Payer: Self-pay | Admitting: Family Medicine

## 2024-10-09 MED ORDER — METFORMIN HCL ER 500 MG PO TB24
1000.0000 mg | ORAL_TABLET | Freq: Two times a day (BID) | ORAL | 0 refills | Status: DC
  Filled 2024-10-09: qty 360, 90d supply, fill #0

## 2024-10-14 ENCOUNTER — Ambulatory Visit: Admitting: Behavioral Health

## 2024-10-14 ENCOUNTER — Encounter: Payer: Self-pay | Admitting: Behavioral Health

## 2024-10-14 ENCOUNTER — Other Ambulatory Visit (HOSPITAL_COMMUNITY): Payer: Self-pay

## 2024-10-14 ENCOUNTER — Ambulatory Visit
Admission: RE | Admit: 2024-10-14 | Discharge: 2024-10-14 | Disposition: A | Source: Ambulatory Visit | Attending: Family Medicine | Admitting: Family Medicine

## 2024-10-14 DIAGNOSIS — I77810 Thoracic aortic ectasia: Secondary | ICD-10-CM | POA: Diagnosis not present

## 2024-10-14 DIAGNOSIS — F332 Major depressive disorder, recurrent severe without psychotic features: Secondary | ICD-10-CM | POA: Diagnosis not present

## 2024-10-14 DIAGNOSIS — R9389 Abnormal findings on diagnostic imaging of other specified body structures: Secondary | ICD-10-CM

## 2024-10-14 MED ORDER — CARIPRAZINE HCL 1.5 MG PO CAPS
1.5000 mg | ORAL_CAPSULE | Freq: Every day | ORAL | 2 refills | Status: DC
  Filled 2024-10-14 – 2024-11-07 (×2): qty 90, 90d supply, fill #0

## 2024-10-14 MED ORDER — VILAZODONE HCL 40 MG PO TABS
40.0000 mg | ORAL_TABLET | Freq: Every day | ORAL | 1 refills | Status: DC
  Filled 2024-10-14 – 2024-11-07 (×2): qty 90, 90d supply, fill #0

## 2024-10-14 MED ORDER — IOPAMIDOL (ISOVUE-370) INJECTION 76%
80.0000 mL | Freq: Once | INTRAVENOUS | Status: AC | PRN
  Administered 2024-10-14: 80 mL via INTRAVENOUS

## 2024-10-14 MED ORDER — BUPROPION HCL ER (XL) 300 MG PO TB24
300.0000 mg | ORAL_TABLET | Freq: Every morning | ORAL | 1 refills | Status: DC
  Filled 2024-10-14 – 2024-10-29 (×2): qty 90, 90d supply, fill #0

## 2024-10-14 NOTE — Progress Notes (Signed)
 [facility]  Patient ID: Brandon Gordon,  MRN: [id number]  PCP: Arloa Elsie SAUNDERS, MD  Date of Evaluation: 10/14/2024 Time spent:20 minutes  Chief Complaint:  Chief Complaint   Anxiety; Depression; Follow-up; Patient Education; Medication Refill     HISTORY/CURRENT STATUS: HPI 15, 53 year old male presents to this office for initial visit and to establish care.  Collateral information should be considered reliable.  Patient is very gracious and cooperative.  So far reporting significant improvement with anxiety, depression and irritability.   Says, I think we found the right fit.  Feels that Vraylar  so far is working very well. No undesirable side effects.  He reports depression today 3/10, and anxiety at 3/10. Eating well.  He agrees to stay on course with current medication regimen .He denies history of mania, no psychosis, no auditory or visual hallucinations, or delirium.  Denies SI or HI.  Says that he feels safe and verbally contracts for safety with this clinical research associate.  Says that he fears death and would never harm self.  Acknowledges strong family support from spouse.    Past psychiatric medication trial: Lexapro Prozac Viibryd  Wellbutrin      Individual Medical History/ Review of Systems: Changes? :No   Allergies: Cephalexin, Lexapro [escitalopram oxalate], and Victoza [liraglutide]  Current Medications:  Current Outpatient Medications:    ALPRAZolam  (XANAX ) 1 MG tablet, Take 2 tablets (2 mg total) by mouth daily as needed., Disp: 60 tablet, Rfl: 3   ALPRAZolam  (XANAX ) 1 MG tablet, Take up to 2 tablets (2 mg total) by mouth daily as needed., Disp: 60 tablet, Rfl: 3   amLODipine  (NORVASC ) 5 MG tablet, Take 1 tablet (5 mg total) by mouth daily., Disp: 30 tablet, Rfl: 5   atorvastatin  (LIPITOR) 40 MG tablet, Take 1 tablet (40 mg total) by mouth daily., Disp: 90 tablet, Rfl: 2   atorvastatin  (LIPITOR) 40 MG tablet, Take 1 tablet (40 mg total) by mouth daily.,  Disp: 90 tablet, Rfl: 3   azelastine  (ASTELIN ) 0.1 % nasal spray, Place 1 spray into both nostrils 2 (two) times daily when pollen count is high., Disp: 30 mL, Rfl: 0   Buprenorphine  HCl-Naloxone  HCl (ZUBSOLV ) 0.7-0.18 MG SUBL, Place 1 tablet onto the tongue daily., Disp: 30 tablet, Rfl: 3   Buprenorphine  HCl-Naloxone  HCl (ZUBSOLV ) 0.7-0.18 MG SUBL, Place 1 tablet under the tongue daily., Disp: 30 tablet, Rfl: 3   Buprenorphine  HCl-Naloxone  HCl (ZUBSOLV ) 0.7-0.18 MG SUBL, Place 1 tablet under the tongue daily., Disp: 30 tablet, Rfl: 3   Buprenorphine  HCl-Naloxone  HCl (ZUBSOLV ) 0.7-0.18 MG SUBL, Place 1 tablet under the tongue daily., Disp: 30 tablet, Rfl: 3   Buprenorphine  HCl-Naloxone  HCl (ZUBSOLV ) 0.7-0.18 MG SUBL, Place 1 tablet under the tongue daily., Disp: 30 tablet, Rfl: 3   buPROPion  (WELLBUTRIN  XL) 300 MG 24 hr tablet, Take 1 tablet (300 mg total) by mouth in the morning., Disp: 90 tablet, Rfl: 1   cariprazine  (VRAYLAR ) 1.5 MG capsule, Take 1 capsule (1.5 mg total) by mouth daily., Disp: 90 capsule, Rfl: 2   furosemide  (LASIX ) 20 MG tablet, Take 1 tablet (20 mg total) by mouth 2 (two) times daily., Disp: 60 tablet, Rfl: 0   gabapentin  (NEURONTIN ) 600 MG tablet, Take 1 tablet (600 mg total) by mouth 4 (four) times daily., Disp: 360 tablet, Rfl: 1   levothyroxine  (SYNTHROID ) 25 MCG tablet, Take 1 tablet orally in the morning on an empty stomach., Disp: 90 tablet, Rfl: 3   losartan  (COZAAR ) 100 MG tablet, Take 1 tablet (100  mg total) by mouth daily., Disp: 30 tablet, Rfl: 5   losartan  (COZAAR ) 50 MG tablet, Take 1 tablet (50 mg total) by mouth daily., Disp: 90 tablet, Rfl: 1   meloxicam  (MOBIC ) 7.5 MG tablet, Take 1 tablet (7.5 mg total) by mouth daily., Disp: 30 tablet, Rfl: 0   meloxicam  (MOBIC ) 7.5 MG tablet, Take 1 tablet (7.5 mg total) by mouth every other day as needed for pain refractory to other interventions., Disp: 30 tablet, Rfl: 0   metFORMIN  (GLUCOPHAGE -XR) 500 MG 24 hr tablet,  Take 2 tablets (1,000 mg total) by mouth 2 (two) times daily., Disp: 360 tablet, Rfl: 0   metoprolol  succinate (TOPROL -XL) 100 MG 24 hr tablet, Take 1 tablet (100 mg total) by mouth daily., Disp: 90 tablet, Rfl: 0   ondansetron  (ZOFRAN ) 8 MG tablet, Take 1 tablet (8 mg total) by mouth 3 (three) times daily as needed for nausea for 10 days, Disp: 30 tablet, Rfl: 1   pantoprazole  (PROTONIX ) 40 MG tablet, Take 1 tablet (40 mg total) by mouth 1/2 to 1 hour before morning meal for indigestion, Disp: 30 tablet, Rfl: 0   Semaglutide ,0.25 or 0.5MG /DOS, (OZEMPIC , 0.25 OR 0.5 MG/DOSE,) 2 MG/3ML SOPN, Inject 0.25 mg into the skin once a week., Disp: 3 mL, Rfl: 5   Semaglutide ,0.25 or 0.5MG /DOS, (OZEMPIC , 0.25 OR 0.5 MG/DOSE,) 2 MG/3ML SOPN, Inject 0.5 mg into the skin once a week., Disp: 3 mL, Rfl: 0   Semaglutide ,0.25 or 0.5MG /DOS, (OZEMPIC , 0.25 OR 0.5 MG/DOSE,) 2 MG/3ML SOPN, Inject 0.5 mg into the skin once a week., Disp: 3 mL, Rfl: 0   Semaglutide ,0.25 or 0.5MG /DOS, (OZEMPIC , 0.25 OR 0.5 MG/DOSE,) 2 MG/3ML SOPN, Inject 0.5 mg into the skin once a week., Disp: 3 mL, Rfl: 0   tadalafil (CIALIS) 20 MG tablet, Take 20 mg by mouth daily as needed for erectile dysfunction., Disp: , Rfl:    testosterone  cypionate (DEPOTESTOSTERONE CYPIONATE) 200 MG/ML injection, Inject 200 mg into the skin every 30 (thirty) days., Disp: , Rfl:    tiZANidine  (ZANAFLEX ) 4 MG tablet, Take 1 tablet (4 mg total) by mouth every 6 (six) hours as needed for muscle spasms., Disp: 60 tablet, Rfl: 1   Vilazodone  HCl (VIIBRYD ) 40 MG TABS, Take 1 tablet (40 mg total) by mouth daily with food., Disp: 90 tablet, Rfl: 1 Medication Side Effects: none  Family Medical/ Social History: Changes? No  MENTAL HEALTH EXAM:  There were no vitals taken for this visit.There is no height or weight on file to calculate BMI.  General Appearance: Casual, Neat, and Well Groomed  Eye Contact:   Speech:  Clear and Coherent  Volume:  Normal  Mood:  NA   Affect:  Appropriate  Thought Process:  Coherent  Orientation:  Full (Time, Place, and Person)  Thought Content: Logical   Suicidal Thoughts:  No  Homicidal Thoughts:  No  Memory:  WNL  Judgement:  Good  Insight:  Good  Psychomotor Activity:  Normal  Concentration:  Concentration: Good  Recall:  Good  Fund of Knowledge: Good  Language: Good  Assets:  Desire for Improvement  ADL's:  Intact  Cognition: WNL  Prognosis:  Good    DIAGNOSES:    ICD-10-CM   1. Severe episode of recurrent major depressive disorder, without psychotic features (HCC)  F33.2 Vilazodone  HCl (VIIBRYD ) 40 MG TABS    buPROPion  (WELLBUTRIN  XL) 300 MG 24 hr tablet    cariprazine  (VRAYLAR ) 1.5 MG capsule      Receiving Psychotherapy:  No    RECOMMENDATIONS:   Greater than 50% of 30 min face to face time with patient was spent on counseling and coordination of care. We discussed his continued significant  improvement in anxiety and depression since starting Vralar.  Requesting no medication adjustments this visit.    We agreed today to:   Will continue Viibryd  40 mg daily after breakfast. Will continue Wellbutrin  300 mg daily To continue  Vraylar  1.5 mg daily in the a.m.   Will report worsening symptoms or side effects promptly Will follow-up in 8  weeks to reassess Provided emergency contact information Discussed potential metabolic side effects associated with atypical antipsychotics, as well as potential risk for movement side effects. Advised pt to contact office if movement side effects occur.  He Discussed potential benefits, risk, and side effects of benzodiazepines to include potential risk of tolerance and dependence, as well as possible drowsiness.  Advised patient not to drive if experiencing drowsiness and to take lowest possible effective dose to minimize risk of dependence and tolerance.  Reviewed PDMP    Redell DELENA Pizza, NP

## 2024-10-15 ENCOUNTER — Other Ambulatory Visit: Payer: Self-pay

## 2024-10-15 DIAGNOSIS — E291 Testicular hypofunction: Secondary | ICD-10-CM | POA: Diagnosis not present

## 2024-10-16 DIAGNOSIS — N1832 Chronic kidney disease, stage 3b: Secondary | ICD-10-CM | POA: Diagnosis not present

## 2024-10-16 DIAGNOSIS — I129 Hypertensive chronic kidney disease with stage 1 through stage 4 chronic kidney disease, or unspecified chronic kidney disease: Secondary | ICD-10-CM | POA: Diagnosis not present

## 2024-10-16 DIAGNOSIS — E875 Hyperkalemia: Secondary | ICD-10-CM | POA: Diagnosis not present

## 2024-10-16 DIAGNOSIS — E1122 Type 2 diabetes mellitus with diabetic chronic kidney disease: Secondary | ICD-10-CM | POA: Diagnosis not present

## 2024-10-21 ENCOUNTER — Encounter: Payer: Self-pay | Admitting: Radiology

## 2024-10-26 ENCOUNTER — Other Ambulatory Visit (HOSPITAL_COMMUNITY): Payer: Self-pay

## 2024-10-28 ENCOUNTER — Other Ambulatory Visit: Payer: Self-pay

## 2024-10-28 ENCOUNTER — Other Ambulatory Visit (HOSPITAL_COMMUNITY): Payer: Self-pay

## 2024-10-28 ENCOUNTER — Encounter: Admitting: Internal Medicine

## 2024-10-28 MED ORDER — FUROSEMIDE 20 MG PO TABS
20.0000 mg | ORAL_TABLET | Freq: Two times a day (BID) | ORAL | 0 refills | Status: DC
  Filled 2024-10-28: qty 60, 30d supply, fill #0

## 2024-10-29 ENCOUNTER — Ambulatory Visit

## 2024-10-29 ENCOUNTER — Other Ambulatory Visit (HOSPITAL_COMMUNITY): Payer: Self-pay

## 2024-10-29 VITALS — BP 121/85 | HR 71 | Temp 98.5°F | Ht 78.0 in | Wt 246.4 lb

## 2024-10-29 DIAGNOSIS — R918 Other nonspecific abnormal finding of lung field: Secondary | ICD-10-CM | POA: Diagnosis not present

## 2024-10-29 NOTE — Patient Instructions (Signed)
  VISIT SUMMARY: You came in today due to shortness of breath and lethargy following your hip replacement surgery. We discussed your history of osteoarthritis, DISH, scoliosis, and your recent symptoms. You have been experiencing episodes of shortness of breath, especially when getting up quickly, and have had some falls. We also reviewed your history of a calcified granuloma in your lung and recent diagnostic tests.  YOUR PLAN: -SHORTNESS OF BREATH: Your shortness of breath, which occurs when you get up quickly, is being evaluated. We have ordered Pulmonary Function Tests (PFTs) to assess your lung function.   -STABLE PULMONARY GRANULOMA: A granuloma is a small area of inflammation in tissue, often due to infection. You have a consultation scheduled with a thoracic surgeon, and we have ordered a high-resolution CT scan of your chest in three months to monitor it.  -NONSPECIFIC ABNORMAL LUNG FINDINGS: Ground glass opacities were found on your CT scan, these findings do not indicate lung damage. We have ordered a high-resolution CT scan in three months and scheduled PFTs to further evaluate your lung function.  INSTRUCTIONS: Please follow up with the thoracic surgeon as scheduled and complete the Pulmonary Function Tests (PFTs) as ordered. We will also need you to have a high-resolution CT scan of your chest in three months. If you experience any new or worsening symptoms, please contact our office.

## 2024-10-29 NOTE — Progress Notes (Signed)
 Subjective:   PATIENT ID: Brandon Gordon GENDER: male DOB: Nov 08, 1971, MRN: [id number]   HPI Discussed the use of AI scribe software for clinical note transcription with the patient, who gave verbal consent to proceed.  History of Present Illness Brandon Gordon is a 53 year old male with osteoarthritis, DISH, and scoliosis who presents with shortness of breath and lethargy following hip replacement surgery. He was referred by Dr. Arloa for evaluation of his pulmonary symptoms and potential granuloma management.  He has experienced persistent lethargy and malaise since his hip replacement surgery last Christmas. He has osteoarthritis, DISH, and scoliosis, and reports joint pain. He has a history of playing college basketball, which he believes has contributed to his current physical state.  He describes his shortness of breath as feeling like he 'got elbowed in the chest' without any congestion. This occurs when he gets up quickly, leading to episodes where he has fallen. Initially, his blood pressure was elevated during these episodes but has normalized over the past ten months.  He has undergone several diagnostic tests, including CT scans and ultrasounds, to evaluate his heart and lungs. He is scheduled to see a thoracic surgeon due to a calcified granuloma near his heart. He has been seeing a psychiatrist since August and is on Vraylar , which he finds helpful for managing stress.  He reports episodes of night sweats, waking up drenched, and notes a change in his body temperature regulation, feeling cold more often than before. No fevers or chills, but his hands and feet have been cold. He mentions a decrease in appetite and weight stability around 240-245 pounds.  No history of smoking, coughing, phlegm production, or hemoptysis. He has not traveled outside the [place]  in the past six to ten years and does not engage in activities like spelunking or have pets at home. He has a history of  a calcified granuloma in his lung, first identified in 2000, which has shown slight growth over 25 years.     Past Medical History:  Diagnosis Date   Anxiety    At risk for sleep apnea    STOP-BANG= 5      SENT TO PCP 04-05-2016   Chronic pain syndrome    History of kidney stones    Hyperlipidemia    Hypertension    Left ureteral stone    OA (osteoarthritis)    back, hip   Pain management    Type 2 diabetes mellitus (HCC)      History reviewed. No pertinent family history.   Social History   Socioeconomic History   Marital status: Married    Spouse name: Macario   Number of children: 3   Years of education: 16   Highest education level: Bachelor's degree (e.g., BA, AB, BS)  Occupational History   Not on file  Tobacco Use   Smoking status: Never   Smokeless tobacco: Never  Vaping Use   Vaping status: Never Used  Substance and Sexual Activity   Alcohol  use: Not Currently   Drug use: No   Sexual activity: Yes    Birth control/protection: None  Other Topics Concern   Not on file  Social History Narrative   Lives in [place] with wife and special needs son. Enjoys golf when feeling well. Tries to    Exercise when not uncomfortable with boxing work out bag, walking, outdoor activities.    Social Drivers of Corporate Investment Banker Strain: Not on [employer]  Insecurity: No Food Insecurity (12/08/2023)   Hunger Vital Sign    Worried About Running Out of Food in the Last Year: Never true    Ran Out of Food in the Last Year: Never true  Transportation Needs: No Transportation Needs (12/08/2023)   PRAPARE - Administrator, Civil Service (Medical): No    Lack of Transportation (Non-Medical): No  Physical Activity: Not on file  Stress: Not on file  Social Connections: Unknown (05/03/2022)   Received from [facility]   Social Network    Social Network: Not on file  Intimate Partner Violence: Not At Risk (12/08/2023)   Humiliation, Afraid, Rape, and  Kick questionnaire    Fear of Current or Ex-Partner: No    Emotionally Abused: No    Physically Abused: No    Sexually Abused: No     Allergies  Allergen Reactions   Cephalexin     Other reaction(s): stomach pain   Lexapro [Escitalopram Oxalate] Other (See Comments)    Sexual disfunction   Victoza [Liraglutide] Nausea Only     Outpatient Medications Prior to Visit  Medication Sig Dispense Refill   ALPRAZolam  (XANAX ) 1 MG tablet Take 2 tablets (2 mg total) by mouth daily as needed. 60 tablet 3   ALPRAZolam  (XANAX ) 1 MG tablet Take up to 2 tablets (2 mg total) by mouth daily as needed. 60 tablet 3   amLODipine  (NORVASC ) 5 MG tablet Take 1 tablet (5 mg total) by mouth daily. 30 tablet 5   atorvastatin  (LIPITOR) 40 MG tablet Take 1 tablet (40 mg total) by mouth daily. 90 tablet 2   atorvastatin  (LIPITOR) 40 MG tablet Take 1 tablet (40 mg total) by mouth daily. 90 tablet 3   azelastine  (ASTELIN ) 0.1 % nasal spray Place 1 spray into both nostrils 2 (two) times daily when pollen count is high. 30 mL 0   Buprenorphine  HCl-Naloxone  HCl (ZUBSOLV ) 0.7-0.18 MG SUBL Place 1 tablet onto the tongue daily. 30 tablet 3   Buprenorphine  HCl-Naloxone  HCl (ZUBSOLV ) 0.7-0.18 MG SUBL Place 1 tablet under the tongue daily. 30 tablet 3   Buprenorphine  HCl-Naloxone  HCl (ZUBSOLV ) 0.7-0.18 MG SUBL Place 1 tablet under the tongue daily. 30 tablet 3   Buprenorphine  HCl-Naloxone  HCl (ZUBSOLV ) 0.7-0.18 MG SUBL Place 1 tablet under the tongue daily. 30 tablet 3   Buprenorphine  HCl-Naloxone  HCl (ZUBSOLV ) 0.7-0.18 MG SUBL Place 1 tablet under the tongue daily. 30 tablet 3   buPROPion  (WELLBUTRIN  XL) 300 MG 24 hr tablet Take 1 tablet (300 mg total) by mouth in the morning. 90 tablet 1   cariprazine  (VRAYLAR ) 1.5 MG capsule Take 1 capsule (1.5 mg total) by mouth daily. 90 capsule 2   furosemide  (LASIX ) 20 MG tablet Take 1 tablet (20 mg total) by mouth 2 (two) times daily. 60 tablet 0   gabapentin  (NEURONTIN ) 600 MG  tablet Take 1 tablet (600 mg total) by mouth 4 (four) times daily. 360 tablet 1   levothyroxine  (SYNTHROID ) 25 MCG tablet Take 1 tablet orally in the morning on an empty stomach. 90 tablet 3   losartan  (COZAAR ) 100 MG tablet Take 1 tablet (100 mg total) by mouth daily. 30 tablet 5   losartan  (COZAAR ) 50 MG tablet Take 1 tablet (50 mg total) by mouth daily. 90 tablet 1   meloxicam  (MOBIC ) 7.5 MG tablet Take 1 tablet (7.5 mg total) by mouth daily. 30 tablet 0   meloxicam  (MOBIC ) 7.5 MG tablet Take 1 tablet (7.5 mg total) by mouth every  other day as needed for pain refractory to other interventions. 30 tablet 0   metFORMIN  (GLUCOPHAGE -XR) 500 MG 24 hr tablet Take 2 tablets (1,000 mg total) by mouth 2 (two) times daily. 360 tablet 0   metoprolol  succinate (TOPROL -XL) 100 MG 24 hr tablet Take 1 tablet (100 mg total) by mouth daily. 90 tablet 0   ondansetron  (ZOFRAN ) 8 MG tablet Take 1 tablet (8 mg total) by mouth 3 (three) times daily as needed for nausea for 10 days 30 tablet 1   pantoprazole  (PROTONIX ) 40 MG tablet Take 1 tablet (40 mg total) by mouth 1/2 to 1 hour before morning meal for indigestion 30 tablet 0   Semaglutide ,0.25 or 0.5MG /DOS, (OZEMPIC , 0.25 OR 0.5 MG/DOSE,) 2 MG/3ML SOPN Inject 0.25 mg into the skin once a week. 3 mL 5   Semaglutide ,0.25 or 0.5MG /DOS, (OZEMPIC , 0.25 OR 0.5 MG/DOSE,) 2 MG/3ML SOPN Inject 0.5 mg into the skin once a week. 3 mL 0   Semaglutide ,0.25 or 0.5MG /DOS, (OZEMPIC , 0.25 OR 0.5 MG/DOSE,) 2 MG/3ML SOPN Inject 0.5 mg into the skin once a week. 3 mL 0   Semaglutide ,0.25 or 0.5MG /DOS, (OZEMPIC , 0.25 OR 0.5 MG/DOSE,) 2 MG/3ML SOPN Inject 0.5 mg into the skin once a week. 3 mL 0   tadalafil (CIALIS) 20 MG tablet Take 20 mg by mouth daily as needed for erectile dysfunction.     testosterone  cypionate (DEPOTESTOSTERONE CYPIONATE) 200 MG/ML injection Inject 200 mg into the skin every 30 (thirty) days.     tiZANidine  (ZANAFLEX ) 4 MG tablet Take 1 tablet (4 mg total) by mouth  every 6 (six) hours as needed for muscle spasms. 60 tablet 1   Vilazodone  HCl (VIIBRYD ) 40 MG TABS Take 1 tablet (40 mg total) by mouth daily with food. 90 tablet 1   No facility-administered medications prior to visit.    ROS Reviewed all systems and reported negative except as above     Objective:   Vitals:   10/29/24 1003  BP: 121/85  Pulse: 71  Temp: 98.5 F (36.9 C)  TempSrc: Oral  SpO2: 99%  Weight: 246 lb 6.4 oz (111.8 kg)  Height: 6' 6 (1.981 m)    Physical Exam Physical Exam MEASUREMENTS: Weight- 245. GENERAL: Appropriate to age, no acute distress. HEAD EYES EARS NOSE THROAT: Moist mucous membranes, atraumatic, normocephalic. CHEST: Clear to auscultation bilaterally, no wheezing, no crackles, no rales. CARDIAC: Regular rate and rhythm, normal S1, normal S2, no murmurs, no rubs, no gallops. ABDOMEN: Soft, nontender. NEUROLOGICAL: Motor and sensation grossly intact, alert and oriented times X 3. EXTREMITIES: Warm, well perfused, no edema.     CBC    Component Value Date/Time   WBC 9.8 12/09/2023 0343   RBC 3.49 (L) 12/09/2023 0343   HGB 9.8 (L) 12/09/2023 0343   HCT 30.1 (L) 12/09/2023 0343   PLT 212 12/09/2023 0343   MCV 86.2 12/09/2023 0343   MCH 28.1 12/09/2023 0343   MCHC 32.6 12/09/2023 0343   RDW 12.5 12/09/2023 0343   LYMPHSABS 2.1 03/30/2016 0448   MONOABS 0.4 03/30/2016 0448   EOSABS 0.1 03/30/2016 0448   BASOSABS 0.0 03/30/2016 0448     Chest imaging: I reviewed his CT chest performed in October, he has areas of ground glass opacities in the right upper lobe of unclear etiology.  There is no evidence of fibrosis or obstructive lung disease otherwise.  He is also noted to have calcified large granuloma paratracheal.  It was noted on a prior CT scan report in  2000      Assessment & Plan:   Assessment and Plan Assessment & Plan Shortness of breath under evaluation Intermittent shortness of breath post-hip replacement with no lung  pathology on CT. Differential includes recent surgery, environmental factors, and other non-cardiac, non-anxiety causes. - Ordered PFTs to assess lung function. - Advised slow movements to prevent falls.  Stable pulmonary granuloma under surveillance Calcified granuloma with reported slow growth. Thoracic surgeon consultation scheduled.  This is likely a chronic calcified granuloma that does not require any further diagnostic testing.  It will be helpful to obtain thoracic surgery's opinion.  If he has ongoing night sweats and chills, can consider bronchoscopy with EBUS. - Ordered high-resolution CT chest in three months. - Continue follow-up with thoracic surgeon.  Nonspecific abnormal lung findings under evaluation Ground glass opacities on CT - Ordered high-resolution CT chest in three months. - Scheduled PFTs to evaluate lung function.        Zola Herter, MD [facility] Office: [phone]

## 2024-11-05 DIAGNOSIS — E291 Testicular hypofunction: Secondary | ICD-10-CM | POA: Diagnosis not present

## 2024-11-07 ENCOUNTER — Other Ambulatory Visit (HOSPITAL_COMMUNITY): Payer: Self-pay

## 2024-11-07 DIAGNOSIS — N1832 Chronic kidney disease, stage 3b: Secondary | ICD-10-CM | POA: Diagnosis not present

## 2024-11-07 DIAGNOSIS — E118 Type 2 diabetes mellitus with unspecified complications: Secondary | ICD-10-CM | POA: Diagnosis not present

## 2024-11-07 DIAGNOSIS — Z0000 Encounter for general adult medical examination without abnormal findings: Secondary | ICD-10-CM | POA: Diagnosis not present

## 2024-11-07 MED ORDER — ZUBSOLV 0.7-0.18 MG SL SUBL
1.0000 | SUBLINGUAL_TABLET | Freq: Every day | SUBLINGUAL | 3 refills | Status: DC
  Filled 2024-11-07: qty 30, 30d supply, fill #0

## 2024-11-11 DIAGNOSIS — G609 Hereditary and idiopathic neuropathy, unspecified: Secondary | ICD-10-CM | POA: Diagnosis not present

## 2024-11-11 DIAGNOSIS — G894 Chronic pain syndrome: Secondary | ICD-10-CM | POA: Diagnosis not present

## 2024-11-11 DIAGNOSIS — E039 Hypothyroidism, unspecified: Secondary | ICD-10-CM | POA: Diagnosis not present

## 2024-11-11 DIAGNOSIS — F324 Major depressive disorder, single episode, in partial remission: Secondary | ICD-10-CM | POA: Diagnosis not present

## 2024-11-11 DIAGNOSIS — I10 Essential (primary) hypertension: Secondary | ICD-10-CM | POA: Diagnosis not present

## 2024-11-11 DIAGNOSIS — E291 Testicular hypofunction: Secondary | ICD-10-CM | POA: Diagnosis not present

## 2024-11-11 DIAGNOSIS — M4810 Ankylosing hyperostosis [Forestier], site unspecified: Secondary | ICD-10-CM | POA: Diagnosis not present

## 2024-11-11 DIAGNOSIS — E782 Mixed hyperlipidemia: Secondary | ICD-10-CM | POA: Diagnosis not present

## 2024-11-11 DIAGNOSIS — E1165 Type 2 diabetes mellitus with hyperglycemia: Secondary | ICD-10-CM | POA: Diagnosis not present

## 2024-11-11 DIAGNOSIS — F419 Anxiety disorder, unspecified: Secondary | ICD-10-CM | POA: Diagnosis not present

## 2024-11-12 ENCOUNTER — Other Ambulatory Visit: Payer: Self-pay | Admitting: Thoracic Surgery (Cardiothoracic Vascular Surgery)

## 2024-11-12 ENCOUNTER — Ambulatory Visit
Attending: Thoracic Surgery (Cardiothoracic Vascular Surgery) | Admitting: Thoracic Surgery (Cardiothoracic Vascular Surgery)

## 2024-11-12 ENCOUNTER — Encounter: Payer: Self-pay | Admitting: Thoracic Surgery (Cardiothoracic Vascular Surgery)

## 2024-11-12 VITALS — BP 121/76 | HR 84 | Resp 20 | Ht 78.0 in | Wt 246.0 lb

## 2024-11-12 DIAGNOSIS — R222 Localized swelling, mass and lump, trunk: Secondary | ICD-10-CM

## 2024-11-12 NOTE — Progress Notes (Addendum)
 PCP is Arloa Elsie SAUNDERS, MD Referring Provider is Arloa Elsie SAUNDERS, MD  Chief Complaint  Patient presents with   paratrachael mass    Surgical consult/ Chest CT 10/14/24    HPI: Brandon Gordon is a send consultation regarding a right paratracheal mass.  Brandon Gordon is a 53 year old man with a past medical history significant for hypertension, hyperlipidemia, type 2 diabetes, osteoarthritis, bilateral hip replacement, chronic pain syndrome, diffuse idiopathic skeletal hyperostosis, scoliosis, anxiety, and depression.  He had his most recent hip replacement in December 2024.  Since then he has been having issues with general malaise, fatigue and shortness of breath with exertion.  He has had an extensive workup.  As part of his workup he had a CT of the chest which showed a 4.8 x 5.2 cm right paratracheal mass with calcification.  Per the report there was previously a mass in that area that measured 3.5 cm back in 2000.   Past Medical History:  Diagnosis Date   Anxiety    At risk for sleep apnea    STOP-BANG= 5      SENT TO PCP 04-05-2016   Chronic pain syndrome    History of kidney stones    Hyperlipidemia    Hypertension    Left ureteral stone    OA (osteoarthritis)    back, hip   Pain management    Type 2 diabetes mellitus (HCC)     Past Surgical History:  Procedure Laterality Date   TOE SURGERY Left    left 5th toe bone excision   TOTAL HIP ARTHROPLASTY Right 04/22/2011   TOTAL HIP ARTHROPLASTY Left 12/08/2023   Procedure: LEFT TOTAL HIP ARTHROPLASTY ANTERIOR APPROACH;  Surgeon: Vernetta Lonni GRADE, MD;  Location: [facility];  Service: Orthopedics;  Laterality: Left;    History reviewed. No pertinent family history.  Social History Social History   Tobacco Use   Smoking status: Never   Smokeless tobacco: Never  Vaping Use   Vaping status: Never Used  Substance Use Topics   Alcohol  use: Not Currently   Drug use: No    Current Outpatient Medications   Medication Sig Dispense Refill   ALPRAZolam  (XANAX ) 1 MG tablet Take 2 tablets (2 mg total) by mouth daily as needed. 60 tablet 3   ALPRAZolam  (XANAX ) 1 MG tablet Take up to 2 tablets (2 mg total) by mouth daily as needed. 60 tablet 3   amLODipine  (NORVASC ) 5 MG tablet Take 1 tablet (5 mg total) by mouth daily. 30 tablet 5   atorvastatin  (LIPITOR) 40 MG tablet Take 1 tablet (40 mg total) by mouth daily. 90 tablet 2   atorvastatin  (LIPITOR) 40 MG tablet Take 1 tablet (40 mg total) by mouth daily. 90 tablet 3   azelastine  (ASTELIN ) 0.1 % nasal spray Place 1 spray into both nostrils 2 (two) times daily when pollen count is high. 30 mL 0   Buprenorphine  HCl-Naloxone  HCl (ZUBSOLV ) 0.7-0.18 MG SUBL Place 1 tablet onto the tongue daily. 30 tablet 3   Buprenorphine  HCl-Naloxone  HCl (ZUBSOLV ) 0.7-0.18 MG SUBL Place 1 tablet under the tongue daily. 30 tablet 3   Buprenorphine  HCl-Naloxone  HCl (ZUBSOLV ) 0.7-0.18 MG SUBL Place 1 tablet under the tongue daily. 30 tablet 3   Buprenorphine  HCl-Naloxone  HCl (ZUBSOLV ) 0.7-0.18 MG SUBL Place 1 tablet under the tongue daily. 30 tablet 3   Buprenorphine  HCl-Naloxone  HCl (ZUBSOLV ) 0.7-0.18 MG SUBL Place 1 tablet under the tongue daily. 30 tablet 3   Buprenorphine  HCl-Naloxone  HCl (ZUBSOLV ) 0.7-0.18 MG  SUBL Place 1 tablet under the tongue daily. 30 tablet 3   buPROPion  (WELLBUTRIN  XL) 300 MG 24 hr tablet Take 1 tablet (300 mg total) by mouth in the morning. 90 tablet 1   cariprazine  (VRAYLAR ) 1.5 MG capsule Take 1 capsule (1.5 mg total) by mouth daily. 90 capsule 2   furosemide  (LASIX ) 20 MG tablet Take 1 tablet (20 mg total) by mouth 2 (two) times daily. 60 tablet 0   gabapentin  (NEURONTIN ) 600 MG tablet Take 1 tablet (600 mg total) by mouth 4 (four) times daily. 360 tablet 1   levothyroxine  (SYNTHROID ) 25 MCG tablet Take 1 tablet orally in the morning on an empty stomach. 90 tablet 3   losartan  (COZAAR ) 100 MG tablet Take 1 tablet (100 mg total) by mouth daily. 30  tablet 5   losartan  (COZAAR ) 50 MG tablet Take 1 tablet (50 mg total) by mouth daily. 90 tablet 1   meloxicam  (MOBIC ) 7.5 MG tablet Take 1 tablet (7.5 mg total) by mouth daily. 30 tablet 0   meloxicam  (MOBIC ) 7.5 MG tablet Take 1 tablet (7.5 mg total) by mouth every other day as needed for pain refractory to other interventions. 30 tablet 0   metFORMIN  (GLUCOPHAGE -XR) 500 MG 24 hr tablet Take 2 tablets (1,000 mg total) by mouth 2 (two) times daily. 360 tablet 0   metoprolol  succinate (TOPROL -XL) 100 MG 24 hr tablet Take 1 tablet (100 mg total) by mouth daily. 90 tablet 0   ondansetron  (ZOFRAN ) 8 MG tablet Take 1 tablet (8 mg total) by mouth 3 (three) times daily as needed for nausea for 10 days 30 tablet 1   pantoprazole  (PROTONIX ) 40 MG tablet Take 1 tablet (40 mg total) by mouth 1/2 to 1 hour before morning meal for indigestion 30 tablet 0   Semaglutide ,0.25 or 0.5MG /DOS, (OZEMPIC , 0.25 OR 0.5 MG/DOSE,) 2 MG/3ML SOPN Inject 0.25 mg into the skin once a week. 3 mL 5   Semaglutide ,0.25 or 0.5MG /DOS, (OZEMPIC , 0.25 OR 0.5 MG/DOSE,) 2 MG/3ML SOPN Inject 0.5 mg into the skin once a week. 3 mL 0   Semaglutide ,0.25 or 0.5MG /DOS, (OZEMPIC , 0.25 OR 0.5 MG/DOSE,) 2 MG/3ML SOPN Inject 0.5 mg into the skin once a week. 3 mL 0   Semaglutide ,0.25 or 0.5MG /DOS, (OZEMPIC , 0.25 OR 0.5 MG/DOSE,) 2 MG/3ML SOPN Inject 0.5 mg into the skin once a week. 3 mL 0   tadalafil (CIALIS) 20 MG tablet Take 20 mg by mouth daily as needed for erectile dysfunction.     testosterone  cypionate (DEPOTESTOSTERONE CYPIONATE) 200 MG/ML injection Inject 200 mg into the skin every 30 (thirty) days.     tiZANidine  (ZANAFLEX ) 4 MG tablet Take 1 tablet (4 mg total) by mouth every 6 (six) hours as needed for muscle spasms. 60 tablet 1   Vilazodone  HCl (VIIBRYD ) 40 MG TABS Take 1 tablet (40 mg total) by mouth daily with food. 90 tablet 1   No current facility-administered medications for this visit.    Allergies  Allergen Reactions    Cephalexin     Other reaction(s): stomach pain   Lexapro [Escitalopram Oxalate] Other (See Comments)    Sexual disfunction   Victoza [Liraglutide] Nausea Only    Review of Systems  Constitutional:  Positive for activity change and fatigue. Negative for unexpected weight change.  HENT:  Negative for trouble swallowing and voice change.   Respiratory:  Positive for shortness of breath.   Cardiovascular:  Negative for chest pain, palpitations and leg swelling.  Musculoskeletal:  Positive  for arthralgias, gait problem and myalgias.  Neurological:  Negative for seizures and weakness.  Psychiatric/Behavioral:  Positive for dysphoric mood. The patient is nervous/anxious.     BP 121/76   Pulse 84   Resp 20   Ht 6' 6 (1.981 m)   Wt 246 lb (111.6 kg)   SpO2 98% Comment: RA  BMI 28.43 kg/m  Physical Exam Vitals reviewed.  Constitutional:      General: He is not in acute distress.    Appearance: Normal appearance.  HENT:     Head: Normocephalic and atraumatic.  Eyes:     General: No scleral icterus.    Extraocular Movements: Extraocular movements intact.  Cardiovascular:     Rate and Rhythm: Normal rate and regular rhythm.     Heart sounds: Murmur (2/6 systolic murmur left upper sternal border) heard.  Pulmonary:     Effort: Pulmonary effort is normal. No respiratory distress.     Breath sounds: No wheezing or rales.     Comments: Diminished breath sounds right base Abdominal:     General: There is no distension.     Palpations: Abdomen is soft.  Musculoskeletal:     Cervical back: Neck supple.  Lymphadenopathy:     Cervical: No cervical adenopathy.  Skin:    General: Skin is warm and dry.  Neurological:     General: No focal deficit present.     Mental Status: He is alert and oriented to person, place, and time.     Cranial Nerves: No cranial nerve deficit.     Motor: No weakness.      Diagnostic Tests: CTA CHEST AORTA 10/14/2024 12:04:16 PM   TECHNIQUE: CTA of  the chest was performed with and without the administration of 80 mL of iopamidol  (ISOVUE -370) 76% injection. Multiplanar reformatted images are provided for review. MIP images are provided for review. Automated exposure control, iterative reconstruction, and/or weight based adjustment of the mA/kV was utilized to reduce the radiation dose to as low as reasonably achievable.   COMPARISON: 09/26/2024.  CT chest   07/20/1999  report only .   CLINICAL HISTORY: Prior thoracic CT of T-Spine showed abnormal curve in aorta; 80 mL isovue  370; 200 mL bottle no waste; GFR 41, reduced dose given.   FINDINGS:   AORTA: Minimal calcified plaque in the proximal descending thoracic aorta. Mild tortuosity of the ascending thoracic aorta. No thoracic aortic dissection. No aneurysm.   MEDIASTINUM: Scattered LAD coronary calcifications. 4.8 cm partially calcified right paratracheal mass described on a prior CT chest from 07/20/1999 measuring 3.5 cm at that time. The heart and pericardium demonstrate no acute abnormality.   LYMPH NODES: No mediastinal, hilar or axillary lymphadenopathy.   LUNGS AND PLEURA: Geographic mild ground glass opacities in the anterior right upper lobe. No focal consolidation or pulmonary edema. No pleural effusion or pneumothorax.   UPPER ABDOMEN: Gallbladder is physiologically distended. Punctate calcified granulomas in the spleen. 6 mm peripheral calculus in the upper pole of the right renal collecting system. Several low-attenuation bilateral thyroid  nodules. Small bilateral cortical renal cysts.   SOFT TISSUES AND BONES: Multilevel bridging osteophytes in the mid and lower thoracic spine. No acute soft tissue abnormality.   IMPRESSION: 1. Mild tortuosity of the ascending thoracic aorta  without aneurysm. 2. Partially calcified right paratracheal mass measuring 4.8 cm, increased from 3.5 cm on prior CT from 8/1/200; old granuloma would be the most  likely diagnosis, although interval growth is unusual, and indolent neoplasm such as benign  teratoma could conceivably present similarly. Consider thoracic surgical consultation.   Electronically signed by: Katheleen Faes MD 10/15/2024 10:40 AM EDT RP Workstation: [id number]   I personally reviewed the CT images.  Prior images are not available for review.  4.8 x 5.2 cm relatively well-circumscribed right paratracheal mass with peripheral calcifications and irregular distribution.  Coronary calcification.  Severe scoliosis.  Distended gallbladder.  Possible elevation of right hemidiaphragm but may be due to scoliosis  Echocardiogram 09/09/2024 at [facility] Normal LV size, moderate concentric hypertrophy, normal global wall motion, grade 1 diastolic dysfunction.  Mild aortic valve leaflet calcification, no regurgitation or stenosis.  Moderately dilated aortic root.  EF 52%.  No evidence of pulmonary hypertension.  Impression: Brandon Gordon is a 53 year old man with a past medical history significant for hypertension, hyperlipidemia, type 2 diabetes, osteoarthritis, bilateral hip replacement, chronic pain syndrome, diffuse idiopathic skeletal hyperostosis, scoliosis, anxiety, and depression.  He has suffered from general malaise, fatigue, and shortness of breath with exertion for about a year now.  Has had extensive workup without a clear etiology.  As part of his workup he had a CT of the chest which showed a 4.8 x 5.2 cm right paratracheal mass.  Right paratracheal mass-characterized as a calcified granuloma.  He had a CT in 2000 which showed a mass in that area that was deemed a calcified granuloma.  Was 3.5 cm at that time per report.  It would be highly unusual although necessarily impossible for a granuloma to continue to grow.  I think it is more likely to be a teratoma given its appearance and slow growth.  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  Unfortunately, it is in a really  difficult place to access in terms of biopsy.  Given the chronicity of being present for 25 years I do not think it is the source of his symptoms.  I do think the best option would be to do a robotic assisted resection or at least biopsy and debulking of the mass.  It is in a critical location between the SVC, the trachea, the pulmonary artery and vein, and the aorta.  Even if benign it may not be completely resectable due to surrounding structures.  I reviewed the images with Mr. and Mrs. Buday.  We discussed the differential diagnosis.  My recommendation was that we attempt to resect this mass robotically.  The key issue is timing because I do not think this has anything to do with the symptoms and would want that workup to be completed before putting him through an operation.  We did discuss the general nature of the procedure.  The plan would be to use a right robotic VATS for resection (or at least debulking) of the mass.  Manage stands with require general anesthesia.  We discussed the incisions that would be used in need for a drainage tube postoperatively.  We discussed the expected hospital stay and overall recovery.  I informed them of the indications, risks, benefits, and alternatives.  They understand the risks include, but not limited to death, MI, DVT, PE, bleeding, possible need for transfusion, possible need for conversion to open, infection, cardiac arrhythmias, phrenic or recurrent nerve injury, as well as the possibility of other procedural complications.  I do think we need to obtain his old films so that we can compare them ourselves.  Will check an alpha-fetoprotein and beta-hCG for completeness sake.  Right hemidiaphragm appears elevated.  This may be due to his scoliosis but  I think we should check a sniff test to make sure there is not actual dysfunction of the diaphragm given the relationship of his mass to the phrenic nerve.    Coronary calcification-does not have anginal  symptoms but does have this fatigue and has multiple cardiac risk factors and coronary calcification on CT.  Will order coronary CT with FFR to rule out hemodynamically significant CAD.  Plan: Alpha-fetoprotein and beta-hCG Sniff test CT for coronary calcium  scoring with FFR Return in 2 to 3 weeks after testing to discuss management of right paratracheal mass  Elspeth JAYSON Millers, MD [facility] [phone]  Addendum Corrected typo in first line.

## 2024-11-12 NOTE — Progress Notes (Signed)
sni

## 2024-11-13 LAB — AFP TUMOR MARKER: AFP, Serum, Tumor Marker: <1.8 ng/mL (ref 0.0–8.4)

## 2024-11-15 ENCOUNTER — Ambulatory Visit (HOSPITAL_COMMUNITY)
Admission: RE | Admit: 2024-11-15 | Discharge: 2024-11-15 | Disposition: A | Discharge status: Home / Self Care | Payer: Self-pay | Source: Ambulatory Visit | Attending: Thoracic Surgery (Cardiothoracic Vascular Surgery) | Admitting: Thoracic Surgery (Cardiothoracic Vascular Surgery)

## 2024-11-15 DIAGNOSIS — R222 Localized swelling, mass and lump, trunk: Secondary | ICD-10-CM | POA: Insufficient documentation

## 2024-11-19 ENCOUNTER — Telehealth: Payer: Self-pay | Admitting: *Deleted

## 2024-11-19 NOTE — Telephone Encounter (Signed)
 Patient called concerned about CT cardiac score results. Per Dr. Kerrin, urgent referral for cardiac clearance sent to [facility]. Patient aware.

## 2024-11-21 ENCOUNTER — Encounter: Payer: Self-pay | Admitting: Cardiology

## 2024-11-21 NOTE — Progress Notes (Signed)
 Cardiology Office Note   Date:  11/22/2024   ID:  Brandon Gordon, DOB November 15, 1971, MRN [id number]  PCP:  Arloa Elsie SAUNDERS, MD  Cardiologist:   None Referring:  Kerrin Elspeth BROCKS, *  Chief Complaint  Patient presents with   Coronary Artery Disease      History of Present Illness: Brandon Gordon is a 53 y.o. male who presents for evaluation of elevated coronary calcium . He is referred by Kerrin Elspeth BROCKS, *.  The coronary calcium  score was 2098 which was 99% for his age and gender.  He was being evaluated for a right paratracheal mass.  A VATS is planned.    He has never had cardiac workup or history.  However, he has significant cardiovascular risk factors including a dramatic family history.  He has significant orthopedic issues with chronic spine problems and bilateral hip replacements.  He still able to do some swimming.  He might get dyspneic with that level of activity.  The other day he got out of the pool and was in the hot tub.  He stood up from the hot tub and had near syncope.  This has not happened to him before.  He is swimming infrequently and not exercising routinely.  The last time he swam was a couple of weeks ago.  He does not have chest pressure, neck or arm discomfort.  He is not having any resting shortness of breath, PND or orthopnea.  He has had no palpitations.  He is had no frank syncope.   Past Medical History:  Diagnosis Date   Anxiety    Chronic pain syndrome    History of kidney stones    Hyperlipidemia    Hypertension    Left ureteral stone    OA (osteoarthritis)    back, hip   Pain management    Type 2 diabetes mellitus (HCC)     Past Surgical History:  Procedure Laterality Date   TOE SURGERY Left    left 5th toe bone excision   TOTAL HIP ARTHROPLASTY Right 04/22/2011   TOTAL HIP ARTHROPLASTY Left 12/08/2023   Procedure: LEFT TOTAL HIP ARTHROPLASTY ANTERIOR APPROACH;  Surgeon: Vernetta Lonni GRADE, MD;  Location: [facility];  Service:  Orthopedics;  Laterality: Left;     Current Outpatient Medications  Medication Sig Dispense Refill   ALPRAZolam  (XANAX ) 1 MG tablet Take 2 tablets (2 mg total) by mouth daily as needed. 60 tablet 3   amLODipine  (NORVASC ) 5 MG tablet Take 1 tablet (5 mg total) by mouth daily. 30 tablet 5   atorvastatin  (LIPITOR) 80 MG tablet Take 1 tablet (80 mg total) by mouth daily. 90 tablet 3   azelastine  (ASTELIN ) 0.1 % nasal spray Place 1 spray into both nostrils 2 (two) times daily when pollen count is high. 30 mL 0   Buprenorphine  HCl-Naloxone  HCl (ZUBSOLV ) 0.7-0.18 MG SUBL Place 1 tablet onto the tongue daily. 30 tablet 3   buPROPion  (WELLBUTRIN  XL) 300 MG 24 hr tablet Take 1 tablet (300 mg total) by mouth in the morning. 90 tablet 1   cariprazine  (VRAYLAR ) 1.5 MG capsule Take 1 capsule (1.5 mg total) by mouth daily. 90 capsule 2   gabapentin  (NEURONTIN ) 600 MG tablet Take 1 tablet (600 mg total) by mouth 4 (four) times daily. 360 tablet 1   levothyroxine  (SYNTHROID ) 25 MCG tablet Take 1 tablet orally in the morning on an empty stomach. 90 tablet 3   losartan  (COZAAR ) 100 MG tablet Take 1 tablet (  100 mg total) by mouth daily. 30 tablet 5   losartan  (COZAAR ) 50 MG tablet Take 1 tablet (50 mg total) by mouth daily. 90 tablet 1   meloxicam  (MOBIC ) 7.5 MG tablet Take 1 tablet (7.5 mg total) by mouth every other day as needed for pain refractory to other interventions. 30 tablet 0   metFORMIN  (GLUCOPHAGE -XR) 500 MG 24 hr tablet Take 2 tablets (1,000 mg total) by mouth 2 (two) times daily. 360 tablet 0   metoprolol  succinate (TOPROL -XL) 100 MG 24 hr tablet Take 1 tablet (100 mg total) by mouth daily. 90 tablet 0   metoprolol  tartrate (LOPRESSOR ) 100 MG tablet Take 1 tablet (100 mg total) by mouth once for 1 dose. Take 90-120 minutes prior to scan. Hold for SBP less than 110. 1 tablet 0   ondansetron  (ZOFRAN ) 8 MG tablet Take 1 tablet (8 mg total) by mouth 3 (three) times daily as needed for nausea for 10 days  30 tablet 1   pantoprazole  (PROTONIX ) 40 MG tablet Take 1 tablet (40 mg total) by mouth 1/2 to 1 hour before morning meal for indigestion 30 tablet 0   Semaglutide ,0.25 or 0.5MG /DOS, (OZEMPIC , 0.25 OR 0.5 MG/DOSE,) 2 MG/3ML SOPN Inject 0.5 mg into the skin once a week. 3 mL 0   tadalafil (CIALIS) 20 MG tablet Take 20 mg by mouth daily as needed for erectile dysfunction.     testosterone  cypionate (DEPOTESTOSTERONE CYPIONATE) 200 MG/ML injection Inject 200 mg into the skin every 30 (thirty) days.     tiZANidine  (ZANAFLEX ) 4 MG tablet Take 1 tablet (4 mg total) by mouth every 6 (six) hours as needed for muscle spasms. 60 tablet 1   Vilazodone  HCl (VIIBRYD ) 40 MG TABS Take 1 tablet (40 mg total) by mouth daily with food. 90 tablet 1   ALPRAZolam  (XANAX ) 1 MG tablet Take up to 2 tablets (2 mg total) by mouth daily as needed. (Patient not taking: Reported on 11/22/2024) 60 tablet 3   Buprenorphine  HCl-Naloxone  HCl (ZUBSOLV ) 0.7-0.18 MG SUBL Place 1 tablet under the tongue daily. (Patient not taking: Reported on 11/22/2024) 30 tablet 3   Buprenorphine  HCl-Naloxone  HCl (ZUBSOLV ) 0.7-0.18 MG SUBL Place 1 tablet under the tongue daily. (Patient not taking: Reported on 11/22/2024) 30 tablet 3   Buprenorphine  HCl-Naloxone  HCl (ZUBSOLV ) 0.7-0.18 MG SUBL Place 1 tablet under the tongue daily. (Patient not taking: Reported on 11/22/2024) 30 tablet 3   Buprenorphine  HCl-Naloxone  HCl (ZUBSOLV ) 0.7-0.18 MG SUBL Place 1 tablet under the tongue daily. (Patient not taking: Reported on 11/22/2024) 30 tablet 3   Buprenorphine  HCl-Naloxone  HCl (ZUBSOLV ) 0.7-0.18 MG SUBL Place 1 tablet under the tongue daily. (Patient not taking: Reported on 11/22/2024) 30 tablet 3   furosemide  (LASIX ) 20 MG tablet Take 1 tablet (20 mg total) by mouth 2 (two) times daily. 60 tablet 0   meloxicam  (MOBIC ) 7.5 MG tablet Take 1 tablet (7.5 mg total) by mouth daily. (Patient not taking: Reported on 11/22/2024) 30 tablet 0   Semaglutide ,0.25 or  0.5MG /DOS, (OZEMPIC , 0.25 OR 0.5 MG/DOSE,) 2 MG/3ML SOPN Inject 0.25 mg into the skin once a week. (Patient not taking: Reported on 11/22/2024) 3 mL 5   Semaglutide ,0.25 or 0.5MG /DOS, (OZEMPIC , 0.25 OR 0.5 MG/DOSE,) 2 MG/3ML SOPN Inject 0.5 mg into the skin once a week. (Patient not taking: Reported on 11/22/2024) 3 mL 0   Semaglutide ,0.25 or 0.5MG /DOS, (OZEMPIC , 0.25 OR 0.5 MG/DOSE,) 2 MG/3ML SOPN Inject 0.5 mg into the skin once a week. (Patient not taking: Reported  on 11/22/2024) 3 mL 0   No current facility-administered medications for this visit.    Allergies:   Cephalexin, Escitalopram, Lexapro [escitalopram oxalate], and Victoza [liraglutide]    Social History:  The patient  reports that he has never smoked. He has never used smokeless tobacco. He reports that he does not currently use alcohol . He reports that he does not use drugs.   Family History:  The patient's family history includes Heart attack (age of onset: 15) in his mother; Heart disease (age of onset: 76) in his father.    ROS:  Please see the history of present illness.   Otherwise, review of systems are positive for none.   All other systems are reviewed and negative.    PHYSICAL EXAM: VS:  BP 124/88 (BP Location: Left Arm, Patient Position: Sitting, Cuff Size: Large)   Pulse 73   Ht 6' 6 (1.981 m)   Wt 244 lb (110.7 kg)   SpO2 98%   BMI 28.20 kg/m  , BMI Body mass index is 28.2 kg/m. GENERAL:  Well appearing HEENT:  Pupils equal round and reactive, fundi not visualized, oral mucosa unremarkable NECK:  No jugular venous distention, waveform within normal limits, carotid upstroke brisk and symmetric, no bruits, no thyromegaly LYMPHATICS:  No cervical, inguinal adenopathy LUNGS:  Clear to auscultation bilaterally BACK:  No CVA tenderness CHEST:  Unremarkable HEART:  PMI not displaced or sustained,S1 and S2 within normal limits, no S3, no S4, no clicks, no rubs, no murmurs ABD:  Flat, positive bowel sounds normal in  frequency in pitch, no bruits, no rebound, no guarding, no midline pulsatile mass, no hepatomegaly, no splenomegaly EXT:  2 plus pulses throughout, no edema, no cyanosis no clubbing SKIN:  No rashes no nodules NEURO:  Cranial nerves II through XII grossly intact, motor grossly intact throughout [facility]:  Cognitively intact, oriented to person place and time    EKG:  EKG Interpretation Date/Time:  Friday November 22 2024 13:16:52 EST Ventricular Rate:  73 PR Interval:  206 QRS Duration:  94 QT Interval:  374 QTC Calculation: 412 R Axis:   45  Text Interpretation: Normal sinus rhythm Early repolarization Normal ECG When compared with ECG of 29-Nov-2023 14:50, No significant change was found Confirmed by Lavona Agent ([id number]) on 11/22/2024 1:27:56 PM     Recent Labs: 11/29/2023: ALT 17 12/09/2023: BUN 27; Creatinine, Ser 1.23; Hemoglobin 9.8; Platelets 212; Potassium 5.3; Sodium 136    Lipid Panel No results found for: CHOL, TRIG, HDL, CHOLHDL, VLDL, LDLCALC, LDLDIRECT    Wt Readings from Last 3 Encounters:  11/22/24 244 lb (110.7 kg)  11/12/24 246 lb (111.6 kg)  10/29/24 246 lb 6.4 oz (111.8 kg)      Other studies Reviewed: Additional studies/ records that were reviewed today include: Labs, coronary calcium  score, CT, cardiothoracic surgery notes. Review of the above records demonstrates:  Please see elsewhere in the note.     ASSESSMENT AND PLAN:   Elevated coronary calcium : The patient has shortness of breath.  He has very elevated coronary calcium .  He has significant cardiovascular risk factors.  The pretest probability of obstructive coronary disease is high.  I am going to order a coronary CTA and further treatment will be based on this.  HTN: His blood pressure is at target.  No change in therapy.    Dyslipidemia:   I would like to increase his Lipitor to 80 mg daily.  I will repeat a lipid profile in 3 months with  an LP(a).  DM: A1c is 8.6.  We  had a long discussion about this and he will follow with his primary provider.  Preop: Preoperative clearance will be pending the results of the above testing.  Current medicines are reviewed at length with the patient today.  The patient does not have concerns regarding medicines.  The following changes have been made: As above  Labs/ tests ordered today include:   Orders Placed This Encounter  Procedures   CT CORONARY MORPH W/CTA COR W/SCORE W/CA W/CM &/OR WO/CM   Lipid panel   Lipoprotein A (LPA)   Basic metabolic panel with GFR   EKG 87-[place]     Disposition:   FU with me in 6 months.   Signed, Lynwood Schilling, MD  11/22/2024 2:23 PM    [facility]

## 2024-11-22 ENCOUNTER — Encounter: Payer: Self-pay | Admitting: Cardiology

## 2024-11-22 ENCOUNTER — Other Ambulatory Visit (HOSPITAL_COMMUNITY): Payer: Self-pay

## 2024-11-22 ENCOUNTER — Ambulatory Visit: Attending: Cardiology | Admitting: Cardiology

## 2024-11-22 VITALS — BP 124/88 | HR 73 | Ht 78.0 in | Wt 244.0 lb

## 2024-11-22 DIAGNOSIS — I25119 Atherosclerotic heart disease of native coronary artery with unspecified angina pectoris: Secondary | ICD-10-CM

## 2024-11-22 DIAGNOSIS — Z01812 Encounter for preprocedural laboratory examination: Secondary | ICD-10-CM

## 2024-11-22 DIAGNOSIS — R931 Abnormal findings on diagnostic imaging of heart and coronary circulation: Secondary | ICD-10-CM

## 2024-11-22 DIAGNOSIS — E785 Hyperlipidemia, unspecified: Secondary | ICD-10-CM

## 2024-11-22 DIAGNOSIS — E118 Type 2 diabetes mellitus with unspecified complications: Secondary | ICD-10-CM | POA: Diagnosis not present

## 2024-11-22 DIAGNOSIS — I10 Essential (primary) hypertension: Secondary | ICD-10-CM | POA: Diagnosis not present

## 2024-11-22 LAB — BASIC METABOLIC PANEL WITH GFR
BUN/Creatinine Ratio: 14 (ref 9–20)
BUN: 24 mg/dL (ref 6–24)
CO2: 24 mmol/L (ref 20–29)
Calcium: 9.8 mg/dL (ref 8.7–10.2)
Chloride: 101 mmol/L (ref 96–106)
Creatinine, Ser: 1.75 mg/dL — ABNORMAL HIGH (ref 0.76–1.27)
Glucose: 204 mg/dL — ABNORMAL HIGH (ref 70–99)
Potassium: 5.7 mmol/L — ABNORMAL HIGH (ref 3.5–5.2)
Sodium: 140 mmol/L (ref 134–144)
eGFR: 46 mL/min/1.73 — ABNORMAL LOW (ref >59)

## 2024-11-22 MED ORDER — METOPROLOL TARTRATE 100 MG PO TABS
100.0000 mg | ORAL_TABLET | Freq: Once | ORAL | 0 refills | Status: DC
  Filled 2024-11-22: qty 1, 1d supply, fill #0

## 2024-11-22 MED ORDER — ATORVASTATIN CALCIUM 80 MG PO TABS
80.0000 mg | ORAL_TABLET | Freq: Every day | ORAL | 3 refills | Status: AC
  Filled 2024-11-22: qty 90, 90d supply, fill #0

## 2024-11-22 MED ORDER — FUROSEMIDE 20 MG PO TABS
20.0000 mg | ORAL_TABLET | Freq: Two times a day (BID) | ORAL | 0 refills | Status: DC
  Filled 2024-11-22: qty 60, 30d supply, fill #0

## 2024-11-22 MED ORDER — GABAPENTIN 600 MG PO TABS
600.0000 mg | ORAL_TABLET | Freq: Four times a day (QID) | ORAL | 1 refills | Status: AC
  Filled 2024-12-04: qty 360, 90d supply, fill #0

## 2024-11-22 NOTE — Patient Instructions (Addendum)
 Medication Instructions:  Increase Lipitor to 80 mg once daily  *If you need a refill on your cardiac medications before your next appointment, please call your pharmacy*  Lab Work: BMET today at [employer] lipid panel and Lpa in 3 months at any [facility] If you have labs (blood work) drawn today and your tests are completely normal, you will receive your results only by: [employer] (if you have MyChart) OR A paper copy in the mail If you have any lab test that is abnormal or we need to change your treatment, we will call you to review the results.  Testing/Procedures: Coronary CTA  Follow-Up: At [facility], you and your health needs are our priority.  As part of our continuing mission to provide you with exceptional heart care, our providers are all part of one team.  This team includes your primary Cardiologist (physician) and Advanced Practice Providers or APPs (Physician Assistants and Nurse Practitioners) who all work together to provide you with the care you need, when you need it.  Your next appointment:   6 month(s)  Provider:   Lavona, MD  We recommend signing up for the patient portal called MyChart.  Sign up information is provided on this After Visit Summary.  MyChart is used to connect with patients for Virtual Visits (Telemedicine).  Patients are able to view lab/test results, encounter notes, upcoming appointments, etc.  Non-urgent messages can be sent to your provider as well.   To learn more about what you can do with MyChart, go to [email or web address].   Other Instructions   Your cardiac CT will be scheduled at one of the below locations:   [facility] [address]  [place], [place] [address]  If scheduled at the [facility] at [address], please enter the parking lot using the [address] entrance and use the FREE valet service at the patient drop-off area. Enter the building and  check-in with registration on the main floor.  Please follow these instructions carefully (unless otherwise directed):  An IV will be required for this test and Nitroglycerin will be given.  Hold all erectile dysfunction medications at least 3 days (72 hrs) prior to test. (Ie viagra, cialis, sildenafil, tadalafil, etc)   On the Night Before the Test: Be sure to Drink plenty of water. Do not consume any caffeinated/decaffeinated beverages or chocolate 12 hours prior to your test. Do not take any antihistamines 12 hours prior to your test.  If the patient has contrast allergy: Patient will need a prescription for Prednisone  and very clear instructions (as follows): Prednisone  50 mg - take 13 hours prior to test Take another Prednisone  50 mg 7 hours prior to test Take another Prednisone  50 mg 1 hour prior to test Take Benadryl  50 mg 1 hour prior to test Patient must complete all four doses of above prophylactic medications. Patient will need a ride after test due to Benadryl .  On the Day of the Test: Drink plenty of water until 1 hour prior to the test. Do not eat any food 1 hour prior to test. You may take your regular medications prior to the test.  Take metoprolol  (Lopressor ) 100 mg two hours prior to test. If you take Furosemide /Hydrochlorothiazide/Spironolactone/Chlorthalidone, please HOLD on the morning of the test. Patients who wear a continuous glucose monitor MUST remove the device prior to scanning. FEMALES- please wear underwire-free bra if available, avoid dresses & tight clothing  After the Test: Drink plenty of water. After receiving IV contrast, you may experience a mild flushed feeling. This is normal. On occasion, you may experience a mild rash up to 24 hours after the test. This is not dangerous. If this occurs, you can take Benadryl  25 mg, Zyrtec, Claritin, or Allegra and increase your fluid intake. (Patients taking Tikosyn should avoid Benadryl , and may take  Zyrtec, Claritin, or Allegra) If you experience trouble breathing, this can be serious. If it is severe call 911 IMMEDIATELY. If it is mild, please call our office.  We will call to schedule your test 2-4 weeks out understanding that some insurance companies will need an authorization prior to the service being performed.   For more information and frequently asked questions, please visit our website : [email or web address]  For non-scheduling related questions, please contact the cardiac imaging nurse navigator should you have any questions/concerns: Cardiac Imaging Nurse Navigators Direct Office Dial: [phone]   For scheduling needs, including cancellations and rescheduling, please call Brittany, [phone].

## 2024-11-24 ENCOUNTER — Ambulatory Visit: Payer: Self-pay | Admitting: Cardiology

## 2024-11-24 DIAGNOSIS — R7989 Other specified abnormal findings of blood chemistry: Secondary | ICD-10-CM

## 2024-11-25 ENCOUNTER — Other Ambulatory Visit: Payer: Self-pay

## 2024-11-25 ENCOUNTER — Encounter (HOSPITAL_COMMUNITY): Payer: Self-pay

## 2024-11-25 ENCOUNTER — Other Ambulatory Visit (HOSPITAL_COMMUNITY): Payer: Self-pay

## 2024-11-25 DIAGNOSIS — R7989 Other specified abnormal findings of blood chemistry: Secondary | ICD-10-CM | POA: Diagnosis not present

## 2024-11-25 LAB — BASIC METABOLIC PANEL WITH GFR
BUN/Creatinine Ratio: 12 (ref 9–20)
BUN: 21 mg/dL (ref 6–24)
CO2: 23 mmol/L (ref 20–29)
Calcium: 9 mg/dL (ref 8.7–10.2)
Chloride: 102 mmol/L (ref 96–106)
Creatinine, Ser: 1.74 mg/dL — ABNORMAL HIGH (ref 0.76–1.27)
Glucose: 320 mg/dL — ABNORMAL HIGH (ref 70–99)
Potassium: 5 mmol/L (ref 3.5–5.2)
Sodium: 140 mmol/L (ref 134–144)
eGFR: 46 mL/min/1.73 — ABNORMAL LOW (ref >59)

## 2024-11-25 MED ORDER — LEVOTHYROXINE SODIUM 50 MCG PO TABS
50.0000 ug | ORAL_TABLET | Freq: Every morning | ORAL | 3 refills | Status: AC
  Filled 2024-11-25: qty 90, 90d supply, fill #0

## 2024-11-25 MED ORDER — OZEMPIC (1 MG/DOSE) 4 MG/3ML ~~LOC~~ SOPN
1.0000 mg | PEN_INJECTOR | SUBCUTANEOUS | 5 refills | Status: AC
  Filled 2024-11-25: qty 3, 28d supply, fill #0
  Filled 2025-01-23: qty 3, 28d supply, fill #1

## 2024-11-25 NOTE — Telephone Encounter (Signed)
 Spoke with pt regarding results. Pt will have BMET drawn today as he scheduled his CT 12/11. Pt will stop Lasix  and pt stated he is no longer taking losartan . Pt's PCP took him off of it. Pt verbalized understanding. All questions if any were answered.

## 2024-11-25 NOTE — Telephone Encounter (Signed)
-----   Message from Nurse Merle P sent at 11/25/2024  8:49 AM EST ----- Noted. Thanks! ----- Message ----- From: Lavona Agent, MD Sent: 11/24/2024   1:10 PM EST To: Chantal JAYSON Requena, RN; Aleck LOISE Bill, #  Please advise the patient that I would like to have him to stop  his Lasix .  I would like to see another BMET a couple of days before the CT.  He might need to hold his ACE inhibitor for this test a  couple of days before.  His creat is elevated.  Call Mr. Mariani with the results and send results to Arloa Elsie SAUNDERS, MD ----- Message ----- From: Interface, [facility] Lab Results In Sent: 11/22/2024  11:36 PM EST To: Agent Lavona, MD

## 2024-11-26 ENCOUNTER — Other Ambulatory Visit (HOSPITAL_COMMUNITY): Payer: Self-pay

## 2024-11-26 DIAGNOSIS — E291 Testicular hypofunction: Secondary | ICD-10-CM | POA: Diagnosis not present

## 2024-11-27 ENCOUNTER — Other Ambulatory Visit (HOSPITAL_COMMUNITY): Payer: Self-pay | Admitting: *Deleted

## 2024-11-27 ENCOUNTER — Other Ambulatory Visit (HOSPITAL_COMMUNITY): Payer: Self-pay

## 2024-11-27 ENCOUNTER — Telehealth (HOSPITAL_COMMUNITY): Payer: Self-pay | Admitting: *Deleted

## 2024-11-27 ENCOUNTER — Inpatient Hospital Stay
Admission: RE | Admit: 2024-11-27 | Discharge: 2024-11-27 | Attending: Thoracic Surgery (Cardiothoracic Vascular Surgery) | Admitting: Thoracic Surgery (Cardiothoracic Vascular Surgery)

## 2024-11-27 ENCOUNTER — Other Ambulatory Visit

## 2024-11-27 DIAGNOSIS — Z01818 Encounter for other preprocedural examination: Secondary | ICD-10-CM | POA: Diagnosis not present

## 2024-11-27 DIAGNOSIS — R222 Localized swelling, mass and lump, trunk: Secondary | ICD-10-CM

## 2024-11-27 MED ORDER — METOPROLOL TARTRATE 100 MG PO TABS
100.0000 mg | ORAL_TABLET | Freq: Once | ORAL | 0 refills | Status: DC
  Filled 2024-11-27: qty 1, 1d supply, fill #0

## 2024-11-27 NOTE — Telephone Encounter (Signed)
 Reaching out to patient to offer assistance regarding upcoming cardiac imaging study; pt verbalizes understanding of appt date/time, parking situation and where to check in, pre-test NPO status and medications ordered, and verified current allergies; name and call back number provided for further questions should they arise Sid Seats RN Navigator Cardiac Imaging [facility] [phone] office [phone] cell

## 2024-11-28 ENCOUNTER — Ambulatory Visit (HOSPITAL_COMMUNITY)
Admission: RE | Admit: 2024-11-28 | Discharge: 2024-11-28 | Disposition: A | Discharge status: Home / Self Care | Source: Ambulatory Visit | Attending: Cardiology | Admitting: Cardiology

## 2024-11-28 DIAGNOSIS — R931 Abnormal findings on diagnostic imaging of heart and coronary circulation: Secondary | ICD-10-CM | POA: Insufficient documentation

## 2024-11-28 DIAGNOSIS — I25119 Atherosclerotic heart disease of native coronary artery with unspecified angina pectoris: Secondary | ICD-10-CM | POA: Insufficient documentation

## 2024-11-28 MED ORDER — NITROGLYCERIN 0.4 MG SL SUBL
0.8000 mg | SUBLINGUAL_TABLET | Freq: Once | SUBLINGUAL | Status: AC
  Administered 2024-11-28: 0.8 mg via SUBLINGUAL

## 2024-11-28 MED ORDER — IOHEXOL 350 MG/ML SOLN
100.0000 mL | Freq: Once | INTRAVENOUS | Status: AC | PRN
  Administered 2024-11-28: 100 mL via INTRAVENOUS

## 2024-11-29 ENCOUNTER — Other Ambulatory Visit: Payer: Self-pay | Admitting: Cardiology

## 2024-11-29 ENCOUNTER — Ambulatory Visit (HOSPITAL_COMMUNITY)
Admission: RE | Admit: 2024-11-29 | Discharge: 2024-11-29 | Disposition: A | Source: Ambulatory Visit | Attending: Cardiology

## 2024-11-29 DIAGNOSIS — R931 Abnormal findings on diagnostic imaging of heart and coronary circulation: Secondary | ICD-10-CM

## 2024-11-29 DIAGNOSIS — I2510 Atherosclerotic heart disease of native coronary artery without angina pectoris: Secondary | ICD-10-CM | POA: Diagnosis not present

## 2024-12-01 ENCOUNTER — Ambulatory Visit: Payer: Self-pay | Admitting: Cardiology

## 2024-12-02 ENCOUNTER — Telehealth: Payer: Self-pay | Admitting: Cardiology

## 2024-12-02 NOTE — Telephone Encounter (Signed)
 Patient is requesting to have Dr. Lavona review CT results if at all possible. Please advise.

## 2024-12-03 ENCOUNTER — Ambulatory Visit
Attending: Thoracic Surgery (Cardiothoracic Vascular Surgery) | Admitting: Thoracic Surgery (Cardiothoracic Vascular Surgery)

## 2024-12-03 ENCOUNTER — Encounter: Payer: Self-pay | Admitting: Thoracic Surgery (Cardiothoracic Vascular Surgery)

## 2024-12-03 VITALS — BP 148/86 | HR 72 | Resp 20 | Ht 78.0 in | Wt 243.2 lb

## 2024-12-03 DIAGNOSIS — R222 Localized swelling, mass and lump, trunk: Secondary | ICD-10-CM | POA: Diagnosis not present

## 2024-12-03 NOTE — Progress Notes (Signed)
 [address] [place] [address]             [phone]    HPI: Mr. Hofmann returns to discuss the results of his recent testing.  Michall Noffke is a 53 year old man with a past medical history significant for hypertension, hyperlipidemia, type 2 diabetes, osteoarthritis, bilateral hip replacement, chronic pain syndrome, diffuse idiopathic skeletal hyperostosis, scoliosis, anxiety, and depression.   He was being evaluated for general malaise, fatigue, and shortness of breath with exertion.  CT of the chest showed a 4.8 x 5.2 cm right paratracheal mass with calcification.  Had previously had a 3.5 cm mass noted in that area in 2000.  It was called granulomatous at that time, and there was no follow-up.  CT also showed extensive coronary calcification.  He had a coronary CT and was seen by Dr. Lavona.  CT with FFR showed focal stenosis in the distal LAD and gradual narrowing in the RCA.  Was also concerned about his right hemidiaphragm appearing elevated.  He had a sniff test which showed normal function of the right hemidiaphragm.  Radiographic appearance likely due to his scoliosis.  Past Medical History:  Diagnosis Date   Anxiety    Chronic pain syndrome    History of kidney stones    Hyperlipidemia    Hypertension    Left ureteral stone    OA (osteoarthritis)    back, hip   Pain management    Type 2 diabetes mellitus (HCC)     Current Outpatient Medications  Medication Sig Dispense Refill   ALPRAZolam  (XANAX ) 1 MG tablet Take 2 tablets (2 mg total) by mouth daily as needed. 60 tablet 3   amLODipine  (NORVASC ) 5 MG tablet Take 1 tablet (5 mg total) by mouth daily. 30 tablet 5   atorvastatin  (LIPITOR) 80 MG tablet Take 1 tablet (80 mg total) by mouth daily. 90 tablet 3   azelastine  (ASTELIN ) 0.1 % nasal spray Place 1 spray into both nostrils 2 (two) times daily when pollen count is high. 30 mL 0   Buprenorphine  HCl-Naloxone  HCl (ZUBSOLV ) 0.7-0.18  MG SUBL Place 1 tablet onto the tongue daily. 30 tablet 3   buPROPion  (WELLBUTRIN  XL) 300 MG 24 hr tablet Take 1 tablet (300 mg total) by mouth in the morning. 90 tablet 1   cariprazine  (VRAYLAR ) 1.5 MG capsule Take 1 capsule (1.5 mg total) by mouth daily. 90 capsule 2   furosemide  (LASIX ) 20 MG tablet Take 1 tablet (20 mg total) by mouth 2 (two) times daily. 60 tablet 0   gabapentin  (NEURONTIN ) 600 MG tablet Take 1 tablet (600 mg total) by mouth 4 (four) times daily. 360 tablet 1   levothyroxine  (SYNTHROID ) 25 MCG tablet Take 1 tablet orally in the morning on an empty stomach. 90 tablet 3   levothyroxine  (SYNTHROID ) 50 MCG tablet Take 1 tablet (50 mcg total) by mouth in the morning on an empty stomach 90 tablet 3   losartan  (COZAAR ) 100 MG tablet Take 1 tablet (100 mg total) by mouth daily. 30 tablet 5   losartan  (COZAAR ) 50 MG tablet Take 1 tablet (50 mg total) by mouth daily. 90 tablet 1   meloxicam  (MOBIC ) 7.5 MG tablet Take 1 tablet (7.5 mg total) by mouth daily. 30 tablet 0   meloxicam  (MOBIC ) 7.5 MG tablet Take 1 tablet (7.5 mg total) by mouth every other day as needed for pain refractory to other interventions. 30 tablet 0  metFORMIN  (GLUCOPHAGE -XR) 500 MG 24 hr tablet Take 2 tablets (1,000 mg total) by mouth 2 (two) times daily. 360 tablet 0   metoprolol  succinate (TOPROL -XL) 100 MG 24 hr tablet Take 1 tablet (100 mg total) by mouth daily. 90 tablet 0   metoprolol  tartrate (LOPRESSOR ) 100 MG tablet Take 1 tablet (100 mg total) by mouth once for 1 dose. Take 90-120 minutes prior to scan. Hold for SBP less than 110. 1 tablet 0   ondansetron  (ZOFRAN ) 8 MG tablet Take 1 tablet (8 mg total) by mouth 3 (three) times daily as needed for nausea for 10 days 30 tablet 1   pantoprazole  (PROTONIX ) 40 MG tablet Take 1 tablet (40 mg total) by mouth 1/2 to 1 hour before morning meal for indigestion 30 tablet 0   Semaglutide , 1 MG/DOSE, (OZEMPIC , 1 MG/DOSE,) 4 MG/3ML SOPN Inject 1 mg into the skin once a  week. 3 mL 5   Semaglutide ,0.25 or 0.5MG /DOS, (OZEMPIC , 0.25 OR 0.5 MG/DOSE,) 2 MG/3ML SOPN Inject 0.25 mg into the skin once a week. 3 mL 5   Semaglutide ,0.25 or 0.5MG /DOS, (OZEMPIC , 0.25 OR 0.5 MG/DOSE,) 2 MG/3ML SOPN Inject 0.5 mg into the skin once a week. 3 mL 0   Semaglutide ,0.25 or 0.5MG /DOS, (OZEMPIC , 0.25 OR 0.5 MG/DOSE,) 2 MG/3ML SOPN Inject 0.5 mg into the skin once a week. 3 mL 0   tadalafil (CIALIS) 20 MG tablet Take 20 mg by mouth daily as needed for erectile dysfunction.     testosterone  cypionate (DEPOTESTOSTERONE CYPIONATE) 200 MG/ML injection Inject 200 mg into the skin every 30 (thirty) days.     tiZANidine  (ZANAFLEX ) 4 MG tablet Take 1 tablet (4 mg total) by mouth every 6 (six) hours as needed for muscle spasms. 60 tablet 1   Vilazodone  HCl (VIIBRYD ) 40 MG TABS Take 1 tablet (40 mg total) by mouth daily with food. 90 tablet 1   No current facility-administered medications for this visit.    Physical Exam BP (!) 148/86 (BP Location: Left Arm, Patient Position: Sitting, Cuff Size: Large)   Pulse 72   Resp 20   Ht 6' 6 (1.981 m)   Wt 243 lb 3.2 oz (110.3 kg)   SpO2 97% Comment: RA  BMI 28.10 kg/m   Diagnostic Tests: BP (!) 148/86 (BP Location: Left Arm, Patient Position: Sitting, Cuff Size: Large)   Pulse 72   Resp 20   Ht 6' 6 (1.981 m)   Wt 243 lb 3.2 oz (110.3 kg)   SpO2 97% Comment: RA  BMI 28.28 kg/m  53 year old man in no acute distress Alert and oriented x 3 with no focal deficits Cardiac regular rate and rhythm with 2/6 systolic murmur Scoliosis Lungs diminished at right base but otherwise clear No peripheral edema   Impression: Melquisedec Journey is a 53 year old man with a past medical history significant for hypertension, hyperlipidemia, type 2 diabetes, osteoarthritis, bilateral hip replacement, chronic pain syndrome, diffuse idiopathic skeletal hyperostosis, scoliosis, anxiety, and depression.   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  Has been previously felt to be a calcified granuloma.  However over time the mass has increased in size.  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  Other germ cell tumors are also in the differential.  Calcified granulomas probably also in the differential but would be extremely unlikely to continue to grow as it has.  I once again had a discussion with Mr. and Mrs. Adebayo regarding the mass.  He has been present for over 25 years  so there is no urgency to removal.  I do not think it is responsible for his symptoms.  However, given its location I think there is potential in the future for narrowing or occlusion of the SVC or innominate vein.  My recommendation is to remove the mass.  He favors resection, but wants to wait until sometime in the spring to have it done.  There is no reason to think that would cause any harm.  We again discussed the surgical procedure.  The plan would be to do a robotic assisted resection of the mass.  We would approach this from the right chest.  He understands need for general anesthesia, the incision to be used, the use of the surgical robot, the use of drainage tube postoperatively, the expected hospital stay, and the overall recovery.  I informed them of the indications, risk, benefits, and alternatives.  They understand risks include, but not limited to death, MI, DVT, PE, bleeding, possible need for transfusion, possible need for conversion to open procedure, infection, cardiac arrhythmias, phrenic or recurrent nerve injury with resultant diaphragmatic or vocal cord dysfunction, as well as possibility of other unforeseeable complications.  I will contact Dr. Lavona regarding the cardiac CT results.  Plan: Follow-up with Dr. Lavona regarding cardiac CT results Return in 3 months with CT chest to discuss surgical resection of mediastinal mass.  Elspeth JAYSON Millers, MD [facility] [phone]

## 2024-12-04 ENCOUNTER — Other Ambulatory Visit (HOSPITAL_COMMUNITY): Payer: Self-pay

## 2024-12-05 NOTE — Telephone Encounter (Signed)
-----   Message from Lavona Agent, MD sent at 12/03/2024  7:22 PM EST ----- I reviewed the results with my partner who read the study.  The patient has distal vessel disease and small branch vessel disease.  This is in the LAD and diagonals.  The RCA does not appear to have  a focal lesion but probably more diffuse distal tapering stenosis.  All of this would point towards medical management.  I called the patient and reviewed this with him.  We are aggressively managing  his lipids.  I have asked him to start back on aspirin .  He will continue his beta-blocker.  He is to get his sugar better controlled.  He tells me and I did review Dr. Chrystal note that the  plan is for follow-up CT with probable surgery in the spring.  We would see him in the office next month and we need to arrange follow-up appointment to continue medical management and review his  anatomy in person.  He was good with this plan.

## 2024-12-05 NOTE — Telephone Encounter (Signed)
 Spoke with pt. Pt scheduled to see Dr. Lavona 1/5. Pt verbalized understanding. All questions if any were answered.

## 2024-12-06 MED ORDER — ZUBSOLV 0.7-0.18 MG SL SUBL
1.0000 | SUBLINGUAL_TABLET | Freq: Every day | SUBLINGUAL | 0 refills | Status: DC
  Filled 2024-12-06: qty 30, 30d supply, fill #0

## 2024-12-10 ENCOUNTER — Other Ambulatory Visit (HOSPITAL_COMMUNITY): Payer: Self-pay

## 2024-12-17 ENCOUNTER — Other Ambulatory Visit (HOSPITAL_COMMUNITY): Payer: Self-pay

## 2024-12-18 ENCOUNTER — Other Ambulatory Visit (HOSPITAL_COMMUNITY): Payer: Self-pay

## 2024-12-18 MED ORDER — METFORMIN HCL ER 500 MG PO TB24
1000.0000 mg | ORAL_TABLET | Freq: Two times a day (BID) | ORAL | 0 refills | Status: AC
  Filled 2024-12-20 – 2025-01-08 (×2): qty 360, 90d supply, fill #0

## 2024-12-20 ENCOUNTER — Encounter (HOSPITAL_COMMUNITY): Payer: Self-pay | Admitting: Pharmacist

## 2024-12-20 ENCOUNTER — Other Ambulatory Visit (HOSPITAL_COMMUNITY): Payer: Self-pay

## 2024-12-21 DIAGNOSIS — I2510 Atherosclerotic heart disease of native coronary artery without angina pectoris: Secondary | ICD-10-CM | POA: Insufficient documentation

## 2024-12-21 DIAGNOSIS — E118 Type 2 diabetes mellitus with unspecified complications: Secondary | ICD-10-CM | POA: Insufficient documentation

## 2024-12-21 DIAGNOSIS — I10 Essential (primary) hypertension: Secondary | ICD-10-CM | POA: Insufficient documentation

## 2024-12-21 DIAGNOSIS — E785 Hyperlipidemia, unspecified: Secondary | ICD-10-CM | POA: Insufficient documentation

## 2024-12-21 NOTE — Progress Notes (Unsigned)
" °  Cardiology Office Note:   Date:  12/23/2024  ID:  Norleen GORMAN Lowe, DOB 09/21/1971, MRN [id number] PCP: Arloa Elsie SAUNDERS, MD  [facility] Providers Cardiologist:  None {  History of Present Illness:   DEZMOND DOWNIE is a 54 y.o. male who presents for evaluation of elevated coronary calcium . He is referred by Kerrin Elspeth BROCKS, *.  The coronary calcium  score was 2098 which was 99% for his age and gender.  He was being evaluated for a right paratracheal mass.  I saw him prior to VATS.    With his significant risk factors I sent him for coronary CT demonstrating LAD 25 to 49% stenosis in the proximal and mid vessel and 70 to 99% stenosis in the distal vessel.  There was a proximal D2 with 70 to 99% stenosis but a very small caliber vessel.  The circumflex had diffuse nonobstructive plaque.  The right coronary artery had a proximal 50 to 69% stenosis.  FFR did not suggest a focal lesion but rather gradual narrowing across the mid and distal vessel.  It was decided to manage it medically.  I spoke with Dr. Kerrin who is planning follow-up.  Since I saw him he has had no new cardiovascular complaints.  He has not been in the swimming pool and gets around slowly because of orthopedic issues.  The patient denies any new symptoms such as chest discomfort, neck or arm discomfort. There has been no new shortness of breath, PND or orthopnea. There have been no reported palpitations, presyncope or syncope.    ROS: As stated in the HPI and negative for all other systems.  Studies Reviewed:    EKG:     NA   Risk Assessment/Calculations:              Physical Exam:   VS:  BP 102/67   Pulse 73   Ht 6' 6 (1.981 m)   Wt 241 lb (109.3 kg)   SpO2 98%   BMI 27.85 kg/m    Wt Readings from Last 3 Encounters:  12/23/24 241 lb (109.3 kg)  12/03/24 243 lb 3.2 oz (110.3 kg)  11/22/24 244 lb (110.7 kg)     GEN: Well nourished, well developed in no acute distress NECK: No JVD; No carotid  bruits CARDIAC: RRR, brief early peaking systolic murmur radiating slightly at the aortic Apley tract, no diastolic murmurs, rubs, gallops RESPIRATORY:  Clear to auscultation without rales, wheezing or rhonchi  ABDOMEN: Soft, non-tender, non-distended EXTREMITIES:  No edema; No deformity   ASSESSMENT AND PLAN:   CAD: The patient has distal vessel and small vessel disease that is likely obstructive but needs to be managed medically.  He does not appear to have any high-grade proximal lesions and no unstable symptoms that would prompt intervention.  We had a long discussion about this and the need for therapeutic lifestyle changes and medical management.   HTN: His blood pressure is at target.  No change in therapy.   Dyslipidemia:   I increased his Lipitor at the last visit and he is to be in the system for a lipid profile and LP(a) in a couple of months.   I think his goal LDL should be 50.  DM: A1c is 8.6 and he is gena have this followed by his primary provider with a goal of about 6.0.    Follow up with me in 6 months  Signed, Lynwood Schilling, MD   "

## 2024-12-23 ENCOUNTER — Ambulatory Visit: Attending: Cardiology | Admitting: Cardiology

## 2024-12-23 ENCOUNTER — Encounter: Payer: Self-pay | Admitting: Cardiology

## 2024-12-23 VITALS — BP 102/67 | HR 73 | Ht 78.0 in | Wt 241.0 lb

## 2024-12-23 DIAGNOSIS — I10 Essential (primary) hypertension: Secondary | ICD-10-CM

## 2024-12-23 DIAGNOSIS — E785 Hyperlipidemia, unspecified: Secondary | ICD-10-CM | POA: Diagnosis not present

## 2024-12-23 DIAGNOSIS — I2510 Atherosclerotic heart disease of native coronary artery without angina pectoris: Secondary | ICD-10-CM

## 2024-12-23 DIAGNOSIS — E118 Type 2 diabetes mellitus with unspecified complications: Secondary | ICD-10-CM

## 2024-12-23 NOTE — Patient Instructions (Signed)
 Medication Instructions:  Your physician recommends that you continue on your current medications as directed. Please refer to the Current Medication list given to you today.  *If you need a refill on your cardiac medications before your next appointment, please call your pharmacy*  Lab Work: NONE If you have labs (blood work) drawn today and your tests are completely normal, you will receive your results only by: MyChart Message (if you have MyChart) OR A paper copy in the mail If you have any lab test that is abnormal or we need to change your treatment, we will call you to review the results.  Testing/Procedures: NONE  Follow-Up: At [facility], you and your health needs are our priority.  As part of our continuing mission to provide you with exceptional heart care, our providers are all part of one team.  This team includes your primary Cardiologist (physician) and Advanced Practice Providers or APPs (Physician Assistants and Nurse Practitioners) who all work together to provide you with the care you need, when you need it.  Your next appointment:   6 months  Provider:   Lavona, MD  We recommend signing up for the patient portal called MyChart.  Sign up information is provided on this After Visit Summary.  MyChart is used to connect with patients for Virtual Visits (Telemedicine).  Patients are able to view lab/test results, encounter notes, upcoming appointments, etc.  Non-urgent messages can be sent to your provider as well.   To learn more about what you can do with MyChart, go to [email or web address].

## 2024-12-24 ENCOUNTER — Encounter: Payer: Self-pay | Admitting: Pharmacist

## 2024-12-24 ENCOUNTER — Other Ambulatory Visit: Payer: Self-pay

## 2024-12-26 ENCOUNTER — Other Ambulatory Visit (HOSPITAL_COMMUNITY): Payer: Self-pay

## 2024-12-26 ENCOUNTER — Other Ambulatory Visit: Payer: Self-pay | Admitting: Cardiology

## 2024-12-26 ENCOUNTER — Other Ambulatory Visit: Payer: Self-pay

## 2024-12-26 MED ORDER — FUROSEMIDE 20 MG PO TABS
20.0000 mg | ORAL_TABLET | Freq: Two times a day (BID) | ORAL | 0 refills | Status: AC
  Filled 2024-12-26 (×2): qty 60, 30d supply, fill #0

## 2024-12-26 MED ORDER — METOPROLOL TARTRATE 100 MG PO TABS
100.0000 mg | ORAL_TABLET | Freq: Once | ORAL | 0 refills | Status: AC
  Filled 2024-12-26: qty 1, 1d supply, fill #0

## 2024-12-27 ENCOUNTER — Other Ambulatory Visit (HOSPITAL_COMMUNITY): Payer: Self-pay

## 2024-12-27 ENCOUNTER — Other Ambulatory Visit: Payer: Self-pay

## 2024-12-27 MED ORDER — METOPROLOL SUCCINATE ER 100 MG PO TB24
100.0000 mg | ORAL_TABLET | Freq: Every day | ORAL | 0 refills | Status: AC
  Filled 2024-12-27: qty 90, 90d supply, fill #0

## 2024-12-31 ENCOUNTER — Encounter: Payer: Self-pay | Admitting: Behavioral Health

## 2024-12-31 ENCOUNTER — Ambulatory Visit: Admitting: Behavioral Health

## 2024-12-31 ENCOUNTER — Other Ambulatory Visit (HOSPITAL_COMMUNITY): Payer: Self-pay

## 2024-12-31 DIAGNOSIS — F332 Major depressive disorder, recurrent severe without psychotic features: Secondary | ICD-10-CM | POA: Diagnosis not present

## 2024-12-31 MED ORDER — CARIPRAZINE HCL 1.5 MG PO CAPS
1.5000 mg | ORAL_CAPSULE | Freq: Every day | ORAL | 2 refills | Status: AC
  Filled 2024-12-31: qty 90, 90d supply, fill #0

## 2024-12-31 MED ORDER — VILAZODONE HCL 40 MG PO TABS
40.0000 mg | ORAL_TABLET | Freq: Every day | ORAL | 1 refills | Status: AC
  Filled 2024-12-31: qty 90, 90d supply, fill #0

## 2024-12-31 MED ORDER — BUPROPION HCL ER (XL) 300 MG PO TB24
300.0000 mg | ORAL_TABLET | Freq: Every morning | ORAL | 1 refills | Status: AC
  Filled 2024-12-31: qty 90, 90d supply, fill #0

## 2024-12-31 NOTE — Progress Notes (Signed)
 "     [facility] Check  Patient ID: Brandon Gordon,  MRN: [id number]  PCP: Arloa Elsie SAUNDERS, MD  Date of Evaluation: 12/31/2024 Time spent:30 minutes  Chief Complaint:  Chief Complaint   Anxiety; Depression; Follow-up; Patient Education; Stress; Medication Refill     HISTORY/CURRENT STATUS: HPI 59, 54 year old male presents to this office for initial visit and to establish care.  Collateral information should be considered reliable.  Patient is very gracious and cooperative.  Says that he has been doing well overall but has experienced some increase in anxiety recently.  However, he would like to wait a few more weeks before deciding if another medication or change would be necessary.  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  No surgery yet determined.  He reports depression today 3/10, and anxiety at 3/10. Eating well.  He agrees to stay on course with current medication regimen .He denies history of mania, no psychosis, no auditory or visual hallucinations, or delirium.  Denies SI or HI.  Says that he feels safe and verbally contracts for safety with this clinical research associate.  Says that he fears death and would never harm self.  Acknowledges strong family support from spouse.    Past psychiatric medication trial: Lexapro Prozac Viibryd  Wellbutrin      Individual Medical History/ Review of Systems: Changes? :No   Allergies: Cephalexin, Escitalopram, Lexapro [escitalopram oxalate], and Victoza [liraglutide]  Current Medications: Current Medications[1] Medication Side Effects: none  Family Medical/ Social History: Changes? No  MENTAL HEALTH EXAM:  There were no vitals taken for this visit.There is no height or weight on file to calculate BMI.  General Appearance: Casual, Neat, and Well Groomed  Eye Contact:  Good  Speech:  Clear and Coherent  Volume:  Normal  Mood:  Anxious and Depressed  Affect:  Depressed and Anxious  Thought  Process:  Coherent  Orientation:  Full (Time, Place, and Person)  Thought Content: Logical   Suicidal Thoughts:  No  Homicidal Thoughts:  No  Memory:  WNL  Judgement:  Good  Insight:  Good  Psychomotor Activity:  Normal  Concentration:  Concentration: Good  Recall:  Good  Fund of Knowledge: Good  Language: Good  Assets:  Desire for Improvement  ADL's:  Intact  Cognition: WNL  Prognosis:  Good    DIAGNOSES:    ICD-10-CM   1. Severe episode of recurrent major depressive disorder, without psychotic features (HCC)  F33.2 buPROPion  (WELLBUTRIN  XL) 300 MG 24 hr tablet    Vilazodone  HCl (VIIBRYD ) 40 MG TABS    cariprazine  (VRAYLAR ) 1.5 MG capsule      Receiving Psychotherapy: No    RECOMMENDATIONS:   Greater than 50% of 30 min face to face time with patient was spent on counseling and coordination of care.  We discussed his continued overall good stability however he has been experiencing some increase in anxiety recently.  Requesting no medication adjustments this visit, but states that he will call if unhealthy levels of anxiety continue.  We agreed today to:   Will continue Viibryd  40 mg daily after breakfast. Will continue Wellbutrin  300 mg daily To continue  Vraylar  1.5 mg daily in the a.m.   Will report worsening symptoms or side effects promptly Will follow-up in 12 weeks to reassess Provided emergency contact information Discussed potential metabolic side effects associated with atypical antipsychotics, as well as potential risk for movement side effects. Advised pt to contact office  if movement side effects occur.  He Discussed potential benefits, risk, and side effects of benzodiazepines to include potential risk of tolerance and dependence, as well as possible drowsiness.  Advised patient not to drive if experiencing drowsiness and to take lowest possible effective dose to minimize risk of dependence and tolerance.  Reviewed PDMP     Redell DELENA Pizza, NP     [1]   Current Outpatient Medications:    ALPRAZolam  (XANAX ) 1 MG tablet, Take 2 tablets (2 mg total) by mouth daily as needed., Disp: 60 tablet, Rfl: 3   amLODipine  (NORVASC ) 5 MG tablet, Take 1 tablet (5 mg total) by mouth daily., Disp: 30 tablet, Rfl: 5   atorvastatin  (LIPITOR) 80 MG tablet, Take 1 tablet (80 mg total) by mouth daily., Disp: 90 tablet, Rfl: 3   azelastine  (ASTELIN ) 0.1 % nasal spray, Place 1 spray into both nostrils 2 (two) times daily when pollen count is high., Disp: 30 mL, Rfl: 0   Buprenorphine  HCl-Naloxone  HCl (ZUBSOLV ) 0.7-0.18 MG SUBL, Place 1 tablet onto the tongue daily., Disp: 30 tablet, Rfl: 3   Buprenorphine  HCl-Naloxone  HCl (ZUBSOLV ) 0.7-0.18 MG SUBL, Place 1 tablet under the tongue daily., Disp: 30 tablet, Rfl: 0   buPROPion  (WELLBUTRIN  XL) 300 MG 24 hr tablet, Take 1 tablet (300 mg total) by mouth in the morning., Disp: 90 tablet, Rfl: 1   cariprazine  (VRAYLAR ) 1.5 MG capsule, Take 1 capsule (1.5 mg total) by mouth daily., Disp: 90 capsule, Rfl: 2   furosemide  (LASIX ) 20 MG tablet, Take 1 tablet (20 mg total) by mouth 2 (two) times daily., Disp: 60 tablet, Rfl: 0   gabapentin  (NEURONTIN ) 600 MG tablet, Take 1 tablet (600 mg total) by mouth 4 (four) times daily., Disp: 360 tablet, Rfl: 1   levothyroxine  (SYNTHROID ) 25 MCG tablet, Take 1 tablet orally in the morning on an empty stomach., Disp: 90 tablet, Rfl: 3   levothyroxine  (SYNTHROID ) 50 MCG tablet, Take 1 tablet (50 mcg total) by mouth in the morning on an empty stomach, Disp: 90 tablet, Rfl: 3   losartan  (COZAAR ) 100 MG tablet, Take 1 tablet (100 mg total) by mouth daily., Disp: 30 tablet, Rfl: 5   losartan  (COZAAR ) 50 MG tablet, Take 1 tablet (50 mg total) by mouth daily., Disp: 90 tablet, Rfl: 1   meloxicam  (MOBIC ) 7.5 MG tablet, Take 1 tablet (7.5 mg total) by mouth daily., Disp: 30 tablet, Rfl: 0   meloxicam  (MOBIC ) 7.5 MG tablet, Take 1 tablet (7.5 mg total) by mouth every other day as needed for pain refractory  to other interventions., Disp: 30 tablet, Rfl: 0   metFORMIN  (GLUCOPHAGE -XR) 500 MG 24 hr tablet, Take 2 tablets (1,000 mg total) by mouth 2 (two) times daily., Disp: 360 tablet, Rfl: 0   metoprolol  succinate (TOPROL -XL) 100 MG 24 hr tablet, Take 1 tablet (100 mg total) by mouth daily., Disp: 90 tablet, Rfl: 0   metoprolol  tartrate (LOPRESSOR ) 100 MG tablet, Take 1 tablet (100 mg total) by mouth once. Take 90-120 minutes prior to scan. Hold for SBP less than 110., Disp: 1 tablet, Rfl: 0   ondansetron  (ZOFRAN ) 8 MG tablet, Take 1 tablet (8 mg total) by mouth 3 (three) times daily as needed for nausea for 10 days, Disp: 30 tablet, Rfl: 1   pantoprazole  (PROTONIX ) 40 MG tablet, Take 1 tablet (40 mg total) by mouth 1/2 to 1 hour before morning meal for indigestion, Disp: 30 tablet, Rfl: 0   Semaglutide , 1 MG/DOSE, (OZEMPIC , 1  MG/DOSE,) 4 MG/3ML SOPN, Inject 1 mg into the skin once a week., Disp: 3 mL, Rfl: 5   Semaglutide ,0.25 or 0.5MG /DOS, (OZEMPIC , 0.25 OR 0.5 MG/DOSE,) 2 MG/3ML SOPN, Inject 0.25 mg into the skin once a week., Disp: 3 mL, Rfl: 5   Semaglutide ,0.25 or 0.5MG /DOS, (OZEMPIC , 0.25 OR 0.5 MG/DOSE,) 2 MG/3ML SOPN, Inject 0.5 mg into the skin once a week., Disp: 3 mL, Rfl: 0   Semaglutide ,0.25 or 0.5MG /DOS, (OZEMPIC , 0.25 OR 0.5 MG/DOSE,) 2 MG/3ML SOPN, Inject 0.5 mg into the skin once a week., Disp: 3 mL, Rfl: 0   tadalafil (CIALIS) 20 MG tablet, Take 20 mg by mouth daily as needed for erectile dysfunction., Disp: , Rfl:    testosterone  cypionate (DEPOTESTOSTERONE CYPIONATE) 200 MG/ML injection, Inject 200 mg into the skin every 30 (thirty) days., Disp: , Rfl:    tiZANidine  (ZANAFLEX ) 4 MG tablet, Take 1 tablet (4 mg total) by mouth every 6 (six) hours as needed for muscle spasms., Disp: 60 tablet, Rfl: 1   Vilazodone  HCl (VIIBRYD ) 40 MG TABS, Take 1 tablet (40 mg total) by mouth daily with food., Disp: 90 tablet, Rfl: 1  "

## 2025-01-02 ENCOUNTER — Other Ambulatory Visit (HOSPITAL_COMMUNITY): Payer: Self-pay

## 2025-01-02 MED ORDER — ALPRAZOLAM 1 MG PO TABS
2.0000 mg | ORAL_TABLET | Freq: Every day | ORAL | 3 refills | Status: AC | PRN
  Filled 2025-01-02: qty 60, 30d supply, fill #0

## 2025-01-06 ENCOUNTER — Other Ambulatory Visit (HOSPITAL_COMMUNITY): Payer: Self-pay

## 2025-01-06 MED ORDER — ZUBSOLV 0.7-0.18 MG SL SUBL
1.0000 | SUBLINGUAL_TABLET | Freq: Every day | SUBLINGUAL | 0 refills | Status: AC
  Filled 2025-01-07: qty 30, 30d supply, fill #0

## 2025-01-07 ENCOUNTER — Other Ambulatory Visit (HOSPITAL_COMMUNITY): Payer: Self-pay

## 2025-01-08 ENCOUNTER — Other Ambulatory Visit (HOSPITAL_COMMUNITY): Payer: Self-pay

## 2025-01-23 ENCOUNTER — Other Ambulatory Visit (HOSPITAL_COMMUNITY): Payer: Self-pay

## 2025-01-29 ENCOUNTER — Other Ambulatory Visit

## 2025-02-12 ENCOUNTER — Ambulatory Visit

## 2025-02-12 ENCOUNTER — Encounter

## 2025-03-31 ENCOUNTER — Ambulatory Visit: Admitting: Behavioral Health
